# Patient Record
Sex: Male | Born: 1960 | Race: White | Hispanic: No | Marital: Single | Smoking: Former smoker
Health system: Southern US, Community
[De-identification: ages and names within clinical notes are randomized; demographics above are authoritative.]

## PROBLEM LIST (undated history)

## (undated) DIAGNOSIS — M1990 Unspecified osteoarthritis, unspecified site: Secondary | ICD-10-CM

## (undated) DIAGNOSIS — F419 Anxiety disorder, unspecified: Secondary | ICD-10-CM

## (undated) HISTORY — PX: VARICOSE VEIN SURGERY: SHX832

---

## 2007-04-30 ENCOUNTER — Emergency Department (HOSPITAL_COMMUNITY): Admission: EM | Admit: 2007-04-30 | Discharge: 2007-04-30 | Payer: Self-pay | Admitting: Nurse Practitioner

## 2007-08-02 ENCOUNTER — Ambulatory Visit: Payer: Self-pay | Admitting: Internal Medicine

## 2007-08-02 LAB — CONVERTED CEMR LAB
AST: 25 units/L (ref 0–37)
Albumin: 3.9 g/dL (ref 3.5–5.2)
Alkaline Phosphatase: 111 units/L (ref 39–117)
BUN: 16 mg/dL (ref 6–23)
Basophils Absolute: 0 10*3/uL (ref 0.0–0.1)
Calcium: 9.8 mg/dL (ref 8.4–10.5)
Chloride: 113 meq/L — ABNORMAL HIGH (ref 96–112)
Cholesterol: 205 mg/dL (ref 0–200)
Creatinine, Ser: 1 mg/dL (ref 0.4–1.5)
HCT: 42.5 % (ref 39.0–52.0)
MCHC: 34.4 g/dL (ref 30.0–36.0)
Monocytes Relative: 9.7 % (ref 3.0–11.0)
Platelets: 188 10*3/uL (ref 150–400)
RBC: 4.77 M/uL (ref 4.22–5.81)
RDW: 13.2 % (ref 11.5–14.6)
Total Bilirubin: 1.1 mg/dL (ref 0.3–1.2)
Total CHOL/HDL Ratio: 6.8
Triglycerides: 92 mg/dL (ref 0–149)

## 2007-11-01 ENCOUNTER — Telehealth (INDEPENDENT_AMBULATORY_CARE_PROVIDER_SITE_OTHER): Payer: Self-pay | Admitting: *Deleted

## 2007-11-01 DIAGNOSIS — F411 Generalized anxiety disorder: Secondary | ICD-10-CM | POA: Insufficient documentation

## 2008-04-30 ENCOUNTER — Ambulatory Visit: Payer: Self-pay | Admitting: Internal Medicine

## 2008-04-30 DIAGNOSIS — K921 Melena: Secondary | ICD-10-CM

## 2008-04-30 DIAGNOSIS — E785 Hyperlipidemia, unspecified: Secondary | ICD-10-CM | POA: Insufficient documentation

## 2008-04-30 DIAGNOSIS — K219 Gastro-esophageal reflux disease without esophagitis: Secondary | ICD-10-CM

## 2008-04-30 DIAGNOSIS — R5381 Other malaise: Secondary | ICD-10-CM

## 2008-04-30 DIAGNOSIS — R5383 Other fatigue: Secondary | ICD-10-CM

## 2008-04-30 DIAGNOSIS — F329 Major depressive disorder, single episode, unspecified: Secondary | ICD-10-CM

## 2008-05-01 ENCOUNTER — Telehealth: Payer: Self-pay | Admitting: Internal Medicine

## 2008-05-01 ENCOUNTER — Encounter: Payer: Self-pay | Admitting: Internal Medicine

## 2008-07-17 LAB — CONVERTED CEMR LAB
ALT: 33 units/L (ref 0–53)
Albumin: 4.1 g/dL (ref 3.5–5.2)
BUN: 14 mg/dL (ref 6–23)
Basophils Absolute: 0 10*3/uL (ref 0.0–0.1)
Basophils Relative: 0.4 % (ref 0.0–1.0)
CO2: 32 meq/L (ref 19–32)
Calcium: 9.6 mg/dL (ref 8.4–10.5)
Creatinine, Ser: 0.9 mg/dL (ref 0.4–1.5)
Eosinophils Absolute: 0.2 10*3/uL (ref 0.0–0.7)
Eosinophils Relative: 3.2 % (ref 0.0–5.0)
Hemoglobin: 14.8 g/dL (ref 13.0–17.0)
Lymphocytes Relative: 25 % (ref 12.0–46.0)
MCHC: 35.2 g/dL (ref 30.0–36.0)
MCV: 91 fL (ref 78.0–100.0)
Neutro Abs: 4.4 10*3/uL (ref 1.4–7.7)
RBC: 4.63 M/uL (ref 4.22–5.81)
TSH: 2.83 microintl units/mL (ref 0.35–5.50)
Total Bilirubin: 1 mg/dL (ref 0.3–1.2)
WBC: 6.8 10*3/uL (ref 4.5–10.5)

## 2010-07-20 ENCOUNTER — Emergency Department (HOSPITAL_COMMUNITY): Admission: EM | Admit: 2010-07-20 | Discharge: 2010-07-20 | Payer: Self-pay | Admitting: Emergency Medicine

## 2011-10-31 ENCOUNTER — Ambulatory Visit (INDEPENDENT_AMBULATORY_CARE_PROVIDER_SITE_OTHER): Payer: Medicare Other | Admitting: Family Medicine

## 2011-10-31 DIAGNOSIS — F988 Other specified behavioral and emotional disorders with onset usually occurring in childhood and adolescence: Secondary | ICD-10-CM

## 2011-10-31 DIAGNOSIS — E781 Pure hyperglyceridemia: Secondary | ICD-10-CM

## 2011-10-31 DIAGNOSIS — F411 Generalized anxiety disorder: Secondary | ICD-10-CM

## 2011-11-30 ENCOUNTER — Ambulatory Visit (INDEPENDENT_AMBULATORY_CARE_PROVIDER_SITE_OTHER): Payer: Medicare Other | Admitting: Family Medicine

## 2011-11-30 DIAGNOSIS — R7989 Other specified abnormal findings of blood chemistry: Secondary | ICD-10-CM

## 2011-11-30 DIAGNOSIS — F988 Other specified behavioral and emotional disorders with onset usually occurring in childhood and adolescence: Secondary | ICD-10-CM

## 2012-02-09 ENCOUNTER — Telehealth: Payer: Self-pay

## 2012-02-09 NOTE — Telephone Encounter (Signed)
PT WOULD LIKE TO SPEAK WITH SOMEONE REGARDING HIS SYMPTONS. STATES HE HAS HAD LYMPH DISEASE BEFORE AND MAY BE HAVING IT AGAIN, I TRIED ADVISING PT TO COME IN, BUT HE WOULD PREFER A CALL FIRST. ALSO HAVING PAIN BY HIS LIVER AND DIDN'T KNOW IF HE WAS DIABETIC PLEASE CALL [phone]

## 2012-02-10 NOTE — Telephone Encounter (Signed)
LMOM to CB. 

## 2012-02-12 NOTE — Telephone Encounter (Signed)
LMOM he will have to RTC to evaluate these symtoms

## 2012-02-29 ENCOUNTER — Ambulatory Visit (INDEPENDENT_AMBULATORY_CARE_PROVIDER_SITE_OTHER): Payer: Medicare Other | Admitting: Family Medicine

## 2012-02-29 VITALS — BP 151/101 | HR 73 | Temp 98.3°F | Resp 16 | Ht 71.25 in | Wt 215.8 lb

## 2012-02-29 DIAGNOSIS — J029 Acute pharyngitis, unspecified: Secondary | ICD-10-CM

## 2012-02-29 LAB — POCT CBC
Granulocyte percent: 58.7 %G (ref 37–80)
HCT, POC: 43.1 % — AB (ref 43.5–53.7)
Hemoglobin: 14.4 g/dL (ref 14.1–18.1)
MCH, POC: 29.2 pg (ref 27–31.2)
MCV: 87.5 fL (ref 80–97)
MID (cbc): 0.5 (ref 0–0.9)
RBC: 4.93 M/uL (ref 4.69–6.13)
WBC: 7.9 10*3/uL (ref 4.6–10.2)

## 2012-02-29 MED ORDER — FLUTICASONE PROPIONATE 50 MCG/ACT NA SUSP: 2.0000 | Freq: Every day | NASAL | Status: DC

## 2012-02-29 NOTE — Progress Notes (Signed)
Patient Name: [name] Date of Birth: 1961-03-04 Medical Record Number: [id number] Gender: male Date of Encounter: 02/29/2012  History of Present Illness:  [name] is a 51 y.o. very pleasant male patient who presents with the following:  "I don't know what's wrong with me."  Complaint of "splitting headaches" for several months (about 6 months), ST that was "terrible" yesterday but better today.  Sometimes feels "like my head is in a barrel" and has sinus pressure and congestion.  He had Lyme disease about a year ago and feels that maybe his symptoms are coming back except he has no rash.  He was treated for Lyme disease with doxycycline last year.    History of anxiety and panic.  Says he wonders if he is being poisoned by one of his neighbors.  Yesterday felt very tired and wanted to come in but felt too bad.  He feels a little better today.    He has continued to eat normally and does not feel that he is confused.  However he does state that sometimes her suffers from very bad panic attacks and thinks that these may fuel his headaches. He has also been overusing caffeine.  He states that there is not friend or family member he can call for support during these episodes. "I either have to come here (clinic) or call 911."    Patient Active Problem List  Diagnoses  . HYPERLIPIDEMIA  . Anxiety State, Unspecified  . DEPRESSION  . GERD  . HEMATOCHEZIA  . FATIGUE   No past medical history on file. No past surgical history on file. History  Substance Use Topics  . Smoking status: Current Some Day Smoker    Types: Cigars  . Smokeless tobacco: Not on file  . Alcohol Use: Not on file   No family history on file. No Known Allergies  Medication list has been reviewed and updated.  Review of Systems: As per HPI- otherwise negative. Mild cough, no fever "I have always been a very hyper person.  I just can't calm down."  Physical Examination: Filed Vitals:   02/29/12 1857    BP: 151/101  Pulse: 73  Temp: 98.3 F (36.8 C)  TempSrc: Oral  Resp: 16  Height: 5' 11.25" (1.81 m)  Weight: 215 lb 12.8 oz (97.886 kg)   Recheck BP 132/95 Body mass index is 29.89 kg/(m^2).  GEN: WDWN, NAD, Non-toxic, A & O x 3 HEENT: Atraumatic, Normocephalic. Neck supple- no meningismus. No masses, No LAD.  Tm and oropharynx wnl.  Nasal cavity congested Ears and Nose: No external deformity. CV: RRR, No M/G/R. No JVD. No thrill. No extra heart sounds. PULM: CTA B, no wheezes, crackles, rhonchi. No retractions. No resp. distress. No accessory muscle use. ABD: S, NT, ND, +BS. No rebound. No HSM. EXTR: No c/c/e NEURO Normal gait. Normal strength, ROM, sensation and DTR all extremities PSYCH: Normally interactive. Conversant. Somewhat anxious.  Has many concerns and symptoms today  Results for orders placed in visit on 02/29/12  POCT CBC      Component Value Range   WBC 7.9  4.6 - 10.2 (K/uL)   Lymph, poc 2.7  0.6 - 3.4    POC LYMPH PERCENT 34.6  10 - 50 (%L)   MID (cbc) 0.5  0 - 0.9    POC MID % 6.7  0 - 12 (%M)   POC Granulocyte 4.6  2 - 6.9    Granulocyte percent 58.7  37 - 80 (%G)  RBC 4.93  4.69 - 6.13 (M/uL)   Hemoglobin 14.4  14.1 - 18.1 (g/dL)   HCT, POC 16.1 (*) 09.6 - 53.7 (%)   MCV 87.5  80 - 97 (fL)   MCH, POC 29.2  27 - 31.2 (pg)   MCHC 33.4  31.8 - 35.4 (g/dL)   RDW, POC 04.5     Platelet Count, POC 255  142 - 424 (K/uL)   MPV 10.1  0 - 99.8 (fL)  POCT RAPID STREP A (OFFICE)      Component Value Range   Rapid Strep A Screen Negative  Negative    Assessment and Plan: 1. Sore throat  POCT CBC, POCT rapid strep A  2. Allergic rhinitis  fluticasone (FLONASE) 50 MCG/ACT nasal spray   Suspect that ST and sinus congestion are likely allergy related.  Try flonase and OTC claritin.  If not feeling better in a few days please let us know- sooner if worse.  I explained to [name] that I cannot totally rule out some of his concerns including poisoning, "cancer" and  "meningitis" at clinic today.  I do not have reason to believe that he is suffering from these conditions- especially as his HA have gone on for 6 months and he has no meningeal signs.  However, he certainly may go to the ED for further evaluation if he desires.  He elected to try treatment for allergies first but will seek further care if he is not better- Sooner if worse.

## 2012-03-21 ENCOUNTER — Telehealth: Payer: Self-pay

## 2012-03-21 MED ORDER — OMEPRAZOLE 40 MG PO CPDR: 40.0000 mg | DELAYED_RELEASE_CAPSULE | Freq: Every day | ORAL | Status: DC

## 2012-03-21 NOTE — Telephone Encounter (Signed)
Ok with plan

## 2012-03-21 NOTE — Telephone Encounter (Signed)
Patient called again re: Omeprazole, wanting to pick it up tonight.  Ok'd 30 day rx, and patient advised to RTC for recheck before next refill.  Had CPE in November. Ok with plan?

## 2012-03-21 NOTE — Telephone Encounter (Signed)
Dr Audria Nine   Pt is requesting a new script on oxetrazole 40 mg. For his heartburn.   He has considered you as his PCP and would like to continue medication previously prescribed by another MD.  [facility] - [address]

## 2012-04-23 ENCOUNTER — Other Ambulatory Visit: Payer: Self-pay | Admitting: Physician Assistant

## 2012-04-23 NOTE — Telephone Encounter (Signed)
PT STATES HIS PHARMACY WAS TRYING TO FAX OVER A REFILL ON SOME MEDICINE FOR HIS STOMACH AND HE WANT TO MAKE SURE DR MCPHERSON KNOW HOW MUCH HE NEEDED IT. PLEASE CALL PT AT [phone]

## 2012-05-28 ENCOUNTER — Telehealth: Payer: Self-pay

## 2012-05-28 MED ORDER — OMEPRAZOLE 40 MG PO CPDR: 40.0000 mg | DELAYED_RELEASE_CAPSULE | Freq: Every day | ORAL | Status: DC

## 2012-05-28 NOTE — Telephone Encounter (Signed)
Pt states pharmacy should be calling about a rx refill on his prilosec, he states it is imperative that he get this rx refill.

## 2012-05-28 NOTE — Telephone Encounter (Signed)
Pt notified that rx was sent in 

## 2012-06-13 ENCOUNTER — Telehealth: Payer: Self-pay

## 2012-06-13 MED ORDER — OMEPRAZOLE 40 MG PO CPDR: 40.0000 mg | DELAYED_RELEASE_CAPSULE | Freq: Every day | ORAL | Status: DC

## 2012-06-13 NOTE — Telephone Encounter (Signed)
Pt has an appt on Tues. Can we RF it one more time for him. Has only one pill left for tomorrow and he gets terrible heartburn without it.

## 2012-06-13 NOTE — Telephone Encounter (Signed)
Rx sent to pharmacy   

## 2012-06-13 NOTE — Telephone Encounter (Signed)
Pt would like to get a refill sent to his pharmacy for Prilosec.

## 2012-06-13 NOTE — Telephone Encounter (Signed)
Pt.notified

## 2012-06-18 ENCOUNTER — Encounter: Payer: Self-pay | Admitting: Family Medicine

## 2012-06-18 ENCOUNTER — Ambulatory Visit (INDEPENDENT_AMBULATORY_CARE_PROVIDER_SITE_OTHER): Payer: Medicare Other | Admitting: Family Medicine

## 2012-06-18 VITALS — BP 139/92 | HR 85 | Temp 97.1°F | Resp 18 | Ht 71.5 in | Wt 226.0 lb

## 2012-06-18 DIAGNOSIS — G8929 Other chronic pain: Secondary | ICD-10-CM

## 2012-06-18 DIAGNOSIS — R5381 Other malaise: Secondary | ICD-10-CM

## 2012-06-18 DIAGNOSIS — R51 Headache: Secondary | ICD-10-CM

## 2012-06-18 DIAGNOSIS — Z8619 Personal history of other infectious and parasitic diseases: Secondary | ICD-10-CM

## 2012-06-18 DIAGNOSIS — M949 Disorder of cartilage, unspecified: Secondary | ICD-10-CM

## 2012-06-18 DIAGNOSIS — M898X9 Other specified disorders of bone, unspecified site: Secondary | ICD-10-CM

## 2012-06-18 DIAGNOSIS — R5383 Other fatigue: Secondary | ICD-10-CM

## 2012-06-18 DIAGNOSIS — M2550 Pain in unspecified joint: Secondary | ICD-10-CM

## 2012-06-18 LAB — COMPREHENSIVE METABOLIC PANEL
ALT: 52 U/L (ref 0–53)
Albumin: 4.2 g/dL (ref 3.5–5.2)
CO2: 25 mEq/L (ref 19–32)
Chloride: 104 mEq/L (ref 96–112)
Glucose, Bld: 204 mg/dL — ABNORMAL HIGH (ref 70–99)
Potassium: 4.5 mEq/L (ref 3.5–5.3)
Sodium: 138 mEq/L (ref 135–145)
Total Protein: 7.1 g/dL (ref 6.0–8.3)

## 2012-06-18 MED ORDER — ALPRAZOLAM 0.5 MG PO TABS: ORAL_TABLET | ORAL | Status: DC

## 2012-06-18 MED ORDER — AMPHETAMINE-DEXTROAMPHETAMINE 15 MG PO TABS: 15.0000 mg | ORAL_TABLET | Freq: Every day | ORAL | Status: DC

## 2012-06-18 MED ORDER — AMPHETAMINE-DEXTROAMPHET ER 15 MG PO CP24: 15.0000 mg | ORAL_CAPSULE | ORAL | Status: DC

## 2012-06-18 MED ORDER — OMEPRAZOLE 40 MG PO CPDR: 40.0000 mg | DELAYED_RELEASE_CAPSULE | Freq: Every day | ORAL | Status: DC

## 2012-06-18 NOTE — Patient Instructions (Signed)
Dupuytren's Contracture Dupuytren's contracture affects the fingers and the palm of the hand. This condition usually develops slowly. It may take many years to develop. The pinky finger and the ring finger are most often affected. These fingers start to curve inward, like a claw. At some point, the fingers cannot go straight anymore. This can make it hard to do things like:  Put on gloves.   Shake hands.   Grab something off a shelf.  The condition usually does not cause pain and is not dangerous. The condition gets its name from the doctor who came up with an operation to fix the problem. His name was Baron Guillaume Dupuytren. Contracture means pulling inward. CAUSES  Dupuytren's contracture does not start with the fingers. It starts in the palm of the hand, under the skin. The tissue under the skin is called fascia. The fascia covers the cords (tendons) that control how the fingers move. In Dupuytren's contracture the fascia tissue becomes thick and then pulls on the cords. That causes the fingers to curl. The condition can affect both hands and any fingers, but it usually strikes one hand worse than the other. The fingers farthest from the thumb are most often the ones that curl. The cause is not clear. Some experts believe it results from an autoimmune reaction. That means the body's immune system (which fights off disease) attacks itself by mistake. What experts do know is that certain conditions and behaviors (called risk factors) make the chance of having this condition more likely. They include:  Age. Most people who have the condition are older than 50.   Sex. It affects men more often than women.   Family history. The condition tends to run in families from countries in [place].   Certain behaviors. People who smoke and drink alcohol are more apt to develop the problem.   Some other medical conditions. Having diabetes makes Dupuytren's contracture more likely.  So does having a condition that involves a seizure (when the brain's function is interrupted).  SYMPTOMS  Signs of this condition take time to develop. Sometimes this takes weeks or months. More often, it takes several years.   Early symptoms:   Skin on the palm of the hand becomes thick. This is usually the first sign.   The skin may look dimpled or puckered.   Lumps (nodules) show up on the palm. There may be one or more lumps. They are not painful.   Later symptoms:   Thick cords of tissue form in the palm of the hand.   The pinky and ring fingers start to curl up into the palm.   The fingers cannot be straightened into their normal position.  DIAGNOSIS  A physical examination is the main way that a healthcare provider can tell if you have Dupuytren's contracture. Other tests usually are not needed. The caregiver will probably:  Look at your hands. Feel your hands. This is to check for thickening and nodules.   Measure finger motion. This tells how much your fingers have contracted (pulled in).   Do a tabletop test. You will be asked to try to put your hand flat on a table, palm down.  TREATMENT  There is no cure for Dupuytren's contracture. But there are ways to treat the symptoms. Options include:  Watching and waiting. The condition develops slowly. Often it does not create problems for a long time. Sometimes the skin gets thick and nodules form, but the fingers never curl. So,   in some cases it is best to just watch the condition carefully and wait to see what happens.   Shots (injections). Different substances may be injected, including:   Steroids. These drugs block swelling. These shots should make the condition less uncomfortable. Steroids may also slow down the condition. Shots are given into the nodules. The effect only lasts awhile. More shots may have to be given.   Enzymes. These are proteins. They weaken the thick tissue. After an injection, the caregiver usually  stretches the fingers.   Needling. A needle is pushed through the skin and into the thick tissue. This is done in several spots. The goal is to break up the thickened tissue. Or to weaken it.   Surgery. This may be suggested if you cannot grasp objects. Or, if you can no longer put your hand in your pocket.   A cut (incision) is made in the palm of the hand. The thick tissue is removed.   Sometimes the thick tissue is attached to the skin. Then, the skin must be removed, too. It is replaced with a piece of skin from another place on your body. That is called a skin graft.   Occupational or hand therapy is almost always needed after surgery. This involves special exercises to get back the use of your hand and fingers. After a skin graft, several months of therapy may be needed.   Sometimes the condition comes back, even after surgery.   Other methods. You can do some things on your own. They include:   Stretching the fingers backwards. Do this often.   Warming the hand and massaging it. Again, do this often.   Using tools with padded grips. This should make things easier.   Wearing heavy gloves while working. This protects the hands.  PROGNOSIS  Dupuytren's contracture usually develops slowly. There is no cure. But, the symptoms can be treated. Sometimes they come back after treatment, but not always. It is important to remember that this is a functional problem and not a life-threatening condition. Document Released: 08/27/2009 Document Revised: 10/19/2011 Document Reviewed: 08/27/2009 [facility] Patient Information 2012 [place], [place].   For better health: get a Men's Multivitamin and a Flax Seed Oil supplement and take both of these daily. Also commit to walking 3 times a week for better mental health and better sleep as well as improving your physical health.

## 2012-06-18 NOTE — Progress Notes (Signed)
  Subjective:    Patient ID: [name], male    DOB: 04-17-1961, 51 y.o.   MRN: [id number]  HPI Pt continues to have sinus congestion and frontal pressure "like something is in his head".   States he has chronic problems with vision- wears contacts when not at home even though he  cannot see well. Thoughts are rushed and mind is in a fog-current medications help.  Pt feels like he feels this way every since he had Lyme disease; some days he feels fine.   "I feel paranoid about what's going on. Not sure what to do about this"."Yesterday was a good  day but 2 days ago was terrible". "I wake up and some days I can feel my liver- it's throbbing".  Denies nausea, vomiting, fever. Feels better after showering.  There is a hole in the roof above his room that needs repair; current residence- 5 years.  Trying to find another place to live.       Review of Systems As per HPI     Objective:   Physical Exam  Nursing note and vitals reviewed. Constitutional: He is oriented to person, place, and time. He appears well-developed and well-nourished. No distress.  HENT:  Head: Normocephalic and atraumatic.  Right Ear: Hearing, tympanic membrane, external ear and ear canal normal.  Left Ear: Hearing, tympanic membrane, external ear and ear canal normal.  Nose: Nose normal. No nasal deformity or septal deviation.  Mouth/Throat: Uvula is midline and mucous membranes are normal. No oral lesions. No uvula swelling. Posterior oropharyngeal erythema present. No oropharyngeal exudate.  Eyes: EOM and lids are normal. Pupils are equal, round, and reactive to light. Right conjunctiva is injected. Left conjunctiva is injected.  Neck: No thyromegaly present.  Cardiovascular: Normal rate, regular rhythm and normal heart sounds.  Exam reveals no gallop.   No murmur heard. Pulmonary/Chest: Effort normal and breath sounds normal. No respiratory distress. He has no wheezes.  Abdominal: Soft. Bowel sounds are  normal. He exhibits no distension and no mass. There is no hepatosplenomegaly. There is tenderness in the right upper quadrant and epigastric area. There is no rebound, no guarding and no CVA tenderness.  Musculoskeletal: Normal range of motion.  Lymphadenopathy:    He has no cervical adenopathy.  Neurological: He is alert and oriented to person, place, and time. No cranial nerve deficit.  Skin: Skin is warm and dry. No rash noted.  Psychiatric: His mood appears anxious. His affect is not labile. His speech is tangential. His speech is not rapid and/or pressured. He is is hyperactive. Thought content is paranoid. Cognition and memory are normal. He does not exhibit a depressed mood.          Assessment & Plan:   1. Chronic joint pain  Vitamin D, 25-hydroxy, Sedimentation Rate  2. History of Lyme disease  Comprehensive metabolic panel, Sedimentation Rate  3. Fatigue - pt not sleeping well Vitamin D, 25-hydroxy, Comprehensive metabolic panel  4. Headache  Sedimentation Rate Reassurance  5. Bone pain  Vitamin D, 25-hydroxy OTC supplements recommended

## 2012-06-19 LAB — SEDIMENTATION RATE: Sed Rate: 1 mm/hr (ref 0–16)

## 2012-06-19 LAB — VITAMIN D 25 HYDROXY (VIT D DEFICIENCY, FRACTURES): Vit D, 25-Hydroxy: 15 ng/mL — ABNORMAL LOW (ref 30–89)

## 2012-06-21 ENCOUNTER — Other Ambulatory Visit: Payer: Self-pay | Admitting: Family Medicine

## 2012-06-21 MED ORDER — ERGOCALCIFEROL 1.25 MG (50000 UT) PO CAPS: 50000.0000 [IU] | ORAL_CAPSULE | ORAL | Status: DC

## 2012-06-21 NOTE — Progress Notes (Signed)
Quick Note:  Please call pt and advise that the following labs are abnormal...  Vitamin D level is low; I am prescribing Vitamin D 50,000 IU Take 1 capsule once a week. You will get 4 capsules per month; be sure to get it refilled each month.  Your blood sugar is high; try to eat healthier and be more active. We will recheck your blood sugar when you return.   We will go over these results when you return.  Copy to pt.  ______

## 2012-07-16 ENCOUNTER — Ambulatory Visit: Payer: Medicare Other | Admitting: Family Medicine

## 2012-07-23 ENCOUNTER — Ambulatory Visit (INDEPENDENT_AMBULATORY_CARE_PROVIDER_SITE_OTHER): Payer: Medicare Other | Admitting: Family Medicine

## 2012-07-23 VITALS — BP 162/92 | HR 83 | Temp 97.8°F | Resp 18 | Ht 72.0 in | Wt 229.0 lb

## 2012-07-23 DIAGNOSIS — R7309 Other abnormal glucose: Secondary | ICD-10-CM

## 2012-07-23 DIAGNOSIS — M79609 Pain in unspecified limb: Secondary | ICD-10-CM

## 2012-07-23 DIAGNOSIS — E559 Vitamin D deficiency, unspecified: Secondary | ICD-10-CM

## 2012-07-23 DIAGNOSIS — M79671 Pain in right foot: Secondary | ICD-10-CM

## 2012-07-23 DIAGNOSIS — R739 Hyperglycemia, unspecified: Secondary | ICD-10-CM

## 2012-07-23 DIAGNOSIS — M720 Palmar fascial fibromatosis [Dupuytren]: Secondary | ICD-10-CM

## 2012-07-23 DIAGNOSIS — E119 Type 2 diabetes mellitus without complications: Secondary | ICD-10-CM

## 2012-07-23 DIAGNOSIS — M79672 Pain in left foot: Secondary | ICD-10-CM

## 2012-07-23 LAB — POCT GLYCOSYLATED HEMOGLOBIN (HGB A1C): Hemoglobin A1C: 6.9

## 2012-07-23 MED ORDER — OMEPRAZOLE 40 MG PO CPDR: 40.0000 mg | DELAYED_RELEASE_CAPSULE | Freq: Every day | ORAL | Status: DC

## 2012-07-23 MED ORDER — ALPRAZOLAM 0.5 MG PO TABS: ORAL_TABLET | ORAL | Status: DC

## 2012-07-23 MED ORDER — AMPHETAMINE-DEXTROAMPHETAMINE 15 MG PO TABS: 15.0000 mg | ORAL_TABLET | Freq: Every day | ORAL | Status: DC

## 2012-07-23 MED ORDER — METFORMIN HCL ER 500 MG PO TB24: 500.0000 mg | ORAL_TABLET | Freq: Every day | ORAL | Status: DC

## 2012-07-23 MED ORDER — FLUTICASONE PROPIONATE 50 MCG/ACT NA SUSP: 2.0000 | Freq: Every day | NASAL | Status: DC

## 2012-07-23 NOTE — Progress Notes (Signed)
S: Pt c/o foot numbness or "like there is a cushion" on bottom of feet; was having redness but no swelling. Has changed shoes and feet feel a little better today. Has some mental fogginess first thing in the morning but it gets better after showering.  Pt has Dupuytren's contracture of the right hand and he wonders if this condition could be affecting his feet. He recalls that his mother had a similar condition that affected her left hand. He has felt like he is walking like his mother ("like an elderly   person") because his feet hurt so bad. He has replaced his shoes with flat slides ([employer] brand) which seems to help.   Re: Depression- wants to try to be more active because he feels better when he exercises regularly; some days, he feels pessimistic about his life. Not following good nutrition and thinks this is part of why he doesn't feel good and has mental  fogginess and fatigue. He craves sweets and drinks several sodas daily.  O:  Filed Vitals:   07/23/12 1445  BP: 162/92  Pulse: 83  Temp: 97.8 F (36.6 C)  Resp: 18   GEN: In NAD; WN.WD. Slightly disheveled appearance. HENT: [place]/AT; EOMI, conj/scl clear COR: RRR; no edema. LUNGS: CTA; normal resp rate and effort. MS: Feet- dorsum of both feet with prominent fat pads over balls of feet with high arch; toes drawn up into hyperflexed position. NEURO: A&O x 3; CNs intact; Sensation to light touch of lower ext grossly intact.  A1C= 6.9%   A/P: 1. Foot pain, bilateral  Ambulatory referral to Podiatry  2. Hyperglycemia  POCT glycosylated hemoglobin (Hb A1C)  3. Type II or unspecified type diabetes mellitus without mention of complication, not stated as uncontrolled  New onset DM with above normal A1C RX: Metformin 500 mg 24hr tablet  1 tab daily with breakfast Improve nutrition; avoid concentrated sweets D/C sodas; stay active.  4. Dupuytren's contracture of right hand    5. Vitamin d deficiency  Continue supplement once a week.

## 2012-07-23 NOTE — Patient Instructions (Signed)
Diabetes, Frequently Asked Questions  WHAT IS DIABETES?  Most of the food we eat is turned into glucose (sugar). Our bodies use it for energy. The pancreas makes a hormone called insulin. It helps glucose get into the cells of our bodies. When you have diabetes, your body either does not make enough insulin or cannot use its own insulin as well as it should. This causes sugars to build up in your blood.  WHAT ARE THE SYMPTOMS OF DIABETES?   Frequent urination.    Excessive thirst.    Unexplained weight loss.    Extreme hunger.    Blurred vision.    Tingling or numbness in hands or feet.    Feeling very tired much of the time.    Dry, itchy skin.    Sores that are slow to heal.    Yeast infections.   WHAT ARE THE TYPES OF DIABETES?  Type 1 Diabetes    About 10% of affected people have this type.    Usually occurs before the age of 30.    Usually occurs in thin to normal weight people.   Type 2 Diabetes   About 90% of affected people have this type.    Usually occurs after the age of 40.    Usually occurs in overweight people.    More likely to have:    A family history of diabetes.    A history of diabetes during pregnancy (gestational diabetes).    High blood pressure.    High cholesterol and triglycerides.   Gestational Diabetes   Occurs in about 4% of pregnancies.    Usually goes away after the baby is born.    More likely to occur in women with:    Family history of diabetes.    Previous gestational diabetes.    Obese.    Over 25 years old.   WHAT IS PRE-DIABETES?  Pre-diabetes means your blood glucose is higher than normal, but lower than the diabetes range. It also means you are at risk of getting type 2 diabetes and heart disease. If you are told you have pre-diabetes, have your blood glucose checked again in 1 to 2 years.  WHAT IS THE TREATMENT FOR DIABETES?   Treatment is aimed at keeping blood glucose near normal levels at all times. Learning how to manage this yourself is important in treating diabetes. Depending on the type of diabetes you have, your treatment will include one or more of the following:   Monitoring your blood glucose.    Meal planning.    Exercise.    Oral medicine (pills) or insulin.   CAN DIABETES BE PREVENTED?  With type 1 diabetes, prevention is more difficult, because the triggers that cause it are not yet known.  With type 2 diabetes, prevention is more likely, with lifestyle changes:   Maintain a healthy weight.    Eat healthy.    Exercise.   IS THERE A CURE FOR DIABETES?  No, there is no cure for diabetes. There is a lot of research going on that is looking for a cure, and progress is being made. Diabetes can be treated and controlled. People with diabetes can manage their diabetes and lead normal, active lives.  SHOULD I BE TESTED FOR DIABETES?  If you are at least 51 years old, you should be tested for diabetes. You should be tested again every 3 years. If you are 45 or older and overweight, you may want to get tested more often. If   you are younger than 45, overweight, and have one or more of the following risk factors, you should be tested:   Family history of diabetes.    Inactive lifestyle.    High blood pressure.   WHAT ARE SOME OTHER SOURCES FOR INFORMATION ON DIABETES?  The following organizations may help in your search for more information on diabetes:  National Diabetes Education Program (NDEP)  Internet: http://www.ndep.nih.gov/resources  American Diabetes Association  Internet: http://www.diabetes.org    Juvenile Diabetes Foundation International  Internet: http://www.jdf.org  Document Released: 11/02/2003 Document Revised: 10/19/2011 Document Reviewed: 08/27/2009  ExitCare Patient Information 2012 [employer].    Diabetes Meal Planning Guide   The diabetes meal planning guide is a tool to help you plan your meals and snacks. It is important for people with diabetes to manage their blood glucose (sugar) levels. Choosing the right foods and the right amounts throughout your day will help control your blood glucose. Eating right can even help you improve your blood pressure and reach or maintain a healthy weight.  CARBOHYDRATE COUNTING MADE EASY  When you eat carbohydrates, they turn to sugar. This raises your blood glucose level. Counting carbohydrates can help you control this level so you feel better. When you plan your meals by counting carbohydrates, you can have more flexibility in what you eat and balance your medicine with your food intake.  Carbohydrate counting simply means adding up the total amount of carbohydrate grams in your meals and snacks. Try to eat about the same amount at each meal. Foods with carbohydrates are listed below. Each portion below is 1 carbohydrate serving or 15 grams of carbohydrates. Ask your dietician how many grams of carbohydrates you should eat at each meal or snack.  Grains and Starches   1 slice bread.     English muffin or hotdog/hamburger bun.     cup cold cereal (unsweetened).    ? cup cooked pasta or rice.     cup starchy vegetables (corn, potatoes, peas, beans, winter squash).    1 tortilla (6 inches).     bagel.    1 waffle or pancake (size of a CD).     cup cooked cereal.    4 to 6 small crackers.   *Whole grain is recommended.  Fruit   1 cup fresh unsweetened berries, melon, papaya, pineapple.    1 small fresh fruit.     banana or mango.     cup fruit juice (4 oz unsweetened).     cup canned fruit in natural juice or water.    2 tbs dried fruit.    12 to 15 grapes or cherries.   Milk and Yogurt   1 cup fat-free or 1% milk.    1 cup soy milk.    6 oz light yogurt with sugar-free sweetener.    6 oz low-fat soy yogurt.    6 oz plain yogurt.   Vegetables    1 cup raw or  cup cooked is counted as 0 carbohydrates or a "free" food.    If you eat 3 or more servings at 1 meal, count them as 1 carbohydrate serving.   Other Carbohydrates    oz chips or pretzels.     cup ice cream or frozen yogurt.     cup sherbet or sorbet.    2 inch square cake, no frosting.    1 tbs honey, sugar, jam, jelly, or syrup.    2 small cookies.    3 squares of   graham crackers.    3 cups popcorn.    6 crackers.    1 cup broth-based soup.    Count 1 cup casserole or other mixed foods as 2 carbohydrate servings.    Foods with less than 20 calories in a serving may be counted as 0 carbohydrates or a "free" food.   You may want to purchase a book or computer software that lists the carbohydrate gram counts of different foods. In addition, the nutrition facts panel on the labels of the foods you eat are a good source of this information. The label will tell you how big the serving size is and the total number of carbohydrate grams you will be eating per serving. Divide this number by 15 to obtain the number of carbohydrate servings in a portion. Remember, 1 carbohydrate serving equals 15 grams of carbohydrate.  SERVING SIZES  Measuring foods and serving sizes helps you make sure you are getting the right amount of food. The list below tells how big or small some common serving sizes are.   1 oz.........4 stacked dice.    3 oz.........Deck of cards.    1 tsp........Tip of little finger.    1 tbs........Thumb.    2 tbs........Golf ball.     cup.......Half of a fist.    1 cup........A fist.   SAMPLE DIABETES MEAL PLAN   Below is a sample meal plan that includes foods from the grain and starches, dairy, vegetable, fruit, and meat groups. A dietician can individualize a meal plan to fit your calorie needs and tell you the number of servings needed from each food group. However, controlling the total amount of carbohydrates in your meal or snack is more important than making sure you include all of the food groups at every meal. You may interchange carbohydrate containing foods (dairy, starches, and fruits).  The meal plan below is an example of a 2000 calorie diet using carbohydrate counting. This meal plan has 17 carbohydrate servings.  Breakfast   1 cup oatmeal (2 carb servings).     cup light yogurt (1 carb serving).    1 cup blueberries (1 carb serving).     cup almonds.   Snack   1 large apple (2 carb servings).    1 low-fat string cheese stick.   Lunch   Chicken breast salad.    1 cup spinach.     cup chopped tomatoes.    2 oz chicken breast, sliced.    2 tbs low-fat Italian dressing.    12 whole-wheat crackers (2 carb servings).    12 to 15 grapes (1 carb serving).    1 cup low-fat milk (1 carb serving).   Snack   1 cup carrots.     cup hummus (1 carb serving).   Dinner   3 oz broiled salmon.    1 cup brown rice (3 carb servings).   Snack   1  cups steamed broccoli (1 carb serving) drizzled with 1 tsp olive oil and lemon juice.    1 cup light pudding (2 carb servings).   DIABETES MEAL PLANNING WORKSHEET  Your dietician can use this worksheet to help you decide how many servings of foods and what types of foods are right for you.    BREAKFAST  Food Group and Servings / Carb Servings  Grain/Starches __________________________________  Dairy __________________________________________  Vegetable ______________________________________  Fruit ___________________________________________  Meat __________________________________________  Fat ____________________________________________  LUNCH   Food Group and Servings / Carb Servings

## 2012-07-24 ENCOUNTER — Encounter: Payer: Self-pay | Admitting: Family Medicine

## 2012-07-27 ENCOUNTER — Encounter (HOSPITAL_COMMUNITY): Payer: Self-pay | Admitting: Emergency Medicine

## 2012-07-27 ENCOUNTER — Emergency Department (HOSPITAL_COMMUNITY)
Admission: EM | Admit: 2012-07-27 | Discharge: 2012-07-27 | Disposition: A | Discharge status: Home / Self Care | Payer: Medicare Other | Attending: Emergency Medicine | Admitting: Emergency Medicine

## 2012-07-27 DIAGNOSIS — Y998 Other external cause status: Secondary | ICD-10-CM | POA: Insufficient documentation

## 2012-07-27 DIAGNOSIS — Y93I9 Activity, other involving external motion: Secondary | ICD-10-CM | POA: Insufficient documentation

## 2012-07-27 DIAGNOSIS — F17200 Nicotine dependence, unspecified, uncomplicated: Secondary | ICD-10-CM | POA: Insufficient documentation

## 2012-07-27 DIAGNOSIS — E119 Type 2 diabetes mellitus without complications: Secondary | ICD-10-CM | POA: Insufficient documentation

## 2012-07-27 DIAGNOSIS — S161XXA Strain of muscle, fascia and tendon at neck level, initial encounter: Secondary | ICD-10-CM

## 2012-07-27 DIAGNOSIS — S139XXA Sprain of joints and ligaments of unspecified parts of neck, initial encounter: Secondary | ICD-10-CM | POA: Insufficient documentation

## 2012-07-27 MED ORDER — METHOCARBAMOL 500 MG PO TABS
1000.0000 mg | ORAL_TABLET | Freq: Once | ORAL | Status: AC
  Administered 2012-07-27: 1000 mg via ORAL
  Filled 2012-07-27: qty 2

## 2012-07-27 MED ORDER — KETOROLAC TROMETHAMINE 60 MG/2ML IM SOLN
60.0000 mg | Freq: Once | INTRAMUSCULAR | Status: AC
  Administered 2012-07-27: 60 mg via INTRAMUSCULAR
  Filled 2012-07-27: qty 2

## 2012-07-27 MED ORDER — TRAMADOL HCL 50 MG PO TABS
50.0000 mg | ORAL_TABLET | Freq: Once | ORAL | Status: AC
  Administered 2012-07-27: 50 mg via ORAL
  Filled 2012-07-27: qty 1

## 2012-07-27 MED ORDER — METHOCARBAMOL 500 MG PO TABS: 500.0000 mg | ORAL_TABLET | Freq: Two times a day (BID) | ORAL | Status: AC

## 2012-07-27 MED ORDER — TRAMADOL HCL 50 MG PO TABS: 50.0000 mg | ORAL_TABLET | Freq: Four times a day (QID) | ORAL | Status: AC | PRN

## 2012-07-27 MED ORDER — IBUPROFEN 600 MG PO TABS: 600.0000 mg | ORAL_TABLET | Freq: Four times a day (QID) | ORAL | Status: AC | PRN
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
## 2012-07-27 NOTE — ED Notes (Signed)
Bedside report received from previous RN 
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
## 2012-08-20 ENCOUNTER — Encounter: Payer: Self-pay | Admitting: Family Medicine

## 2012-08-20 DIAGNOSIS — M216X9 Other acquired deformities of unspecified foot: Secondary | ICD-10-CM | POA: Insufficient documentation

## 2012-09-24 ENCOUNTER — Ambulatory Visit: Payer: Medicare Other | Admitting: Family Medicine

## 2013-01-20 ENCOUNTER — Telehealth: Payer: Self-pay

## 2013-01-20 NOTE — Telephone Encounter (Signed)
Okay to refer? 

## 2013-01-20 NOTE — Telephone Encounter (Signed)
Pt last seen by me ~ 6 months ago; I would prefer he schedule a visit to discuss disease management and why he thinks he needs an Endocrine specialist. His A1c= 6.9% in Sept 2013. This was at the time of diagnosis and medication prescribed Was Metformin oral tab once a day. We discussed some basic nutrition changes. He has anxiety disorder which is probably the reason he feels the need to see a specialist.

## 2013-01-20 NOTE — Telephone Encounter (Signed)
Pt is wanting to talk with someone about getting a referral to dr balan for his diabetes

## 2013-01-21 NOTE — Telephone Encounter (Signed)
Please call patient regarding this he is still wanting a referral to dr Gilman Schmidt office

## 2013-01-22 NOTE — Telephone Encounter (Signed)
Called, left message for him to call me back.

## 2013-01-23 NOTE — Telephone Encounter (Signed)
Contact pt to come in to see me tomorrow, maybe about 2:15 PM. We can discuss the plan (and maybe he would agree to some in-home education from [facility]).

## 2013-01-23 NOTE — Telephone Encounter (Signed)
Patient states he does not think Dr Audria Nine is not knowledgeable about diabetes, he feels like he is having spells and it took him a while to get a diagnosis. He states he is in a fog at times and is unclear if this relates to his diabetes. He feels like he is having these spells more often he states his blood sugars are still elevated on the metformin and he is afraid he is going to have a stroke, he states he does not want to die alone in the street. He sounds VERY anxious.

## 2013-01-24 NOTE — Telephone Encounter (Signed)
Called him, left message for him to call me back to see if he can come in today at 2:15. Amy

## 2013-01-27 NOTE — Telephone Encounter (Signed)
To you FYI, patient has not returned my call about coming in to the office.

## 2013-02-24 ENCOUNTER — Other Ambulatory Visit: Payer: Self-pay | Admitting: Family Medicine

## 2013-03-18 ENCOUNTER — Encounter: Payer: Self-pay | Admitting: Family Medicine

## 2013-03-24 ENCOUNTER — Other Ambulatory Visit: Payer: Self-pay | Admitting: Family Medicine

## 2013-04-03 ENCOUNTER — Encounter: Payer: Self-pay | Admitting: Family Medicine

## 2013-04-03 ENCOUNTER — Ambulatory Visit (INDEPENDENT_AMBULATORY_CARE_PROVIDER_SITE_OTHER): Payer: Medicare Other | Admitting: Family Medicine

## 2013-04-03 ENCOUNTER — Ambulatory Visit: Payer: Medicare Other

## 2013-04-03 ENCOUNTER — Other Ambulatory Visit: Payer: Self-pay | Admitting: Family Medicine

## 2013-04-03 VITALS — BP 146/86 | HR 105 | Temp 97.9°F | Resp 16 | Ht 71.0 in | Wt 227.8 lb

## 2013-04-03 DIAGNOSIS — L909 Atrophic disorder of skin, unspecified: Secondary | ICD-10-CM

## 2013-04-03 DIAGNOSIS — M720 Palmar fascial fibromatosis [Dupuytren]: Secondary | ICD-10-CM

## 2013-04-03 DIAGNOSIS — M546 Pain in thoracic spine: Secondary | ICD-10-CM

## 2013-04-03 DIAGNOSIS — L918 Other hypertrophic disorders of the skin: Secondary | ICD-10-CM

## 2013-04-03 DIAGNOSIS — E119 Type 2 diabetes mellitus without complications: Secondary | ICD-10-CM

## 2013-04-03 DIAGNOSIS — F411 Generalized anxiety disorder: Secondary | ICD-10-CM

## 2013-04-03 DIAGNOSIS — E559 Vitamin D deficiency, unspecified: Secondary | ICD-10-CM

## 2013-04-03 LAB — GLUCOSE, POCT (MANUAL RESULT ENTRY): POC Glucose: 245 mg/dl — AB (ref 70–99)

## 2013-04-03 LAB — POCT GLYCOSYLATED HEMOGLOBIN (HGB A1C): Hemoglobin A1C: 7.2

## 2013-04-03 MED ORDER — ALPRAZOLAM 0.5 MG PO TABS: ORAL_TABLET | ORAL | Status: DC

## 2013-04-03 MED ORDER — TRAMADOL-ACETAMINOPHEN 37.5-325 MG PO TABS: 1.0000 | ORAL_TABLET | Freq: Four times a day (QID) | ORAL | Status: DC | PRN

## 2013-04-03 MED ORDER — AMPHETAMINE-DEXTROAMPHETAMINE 15 MG PO TABS: 15.0000 mg | ORAL_TABLET | Freq: Every day | ORAL | Status: DC

## 2013-04-03 MED ORDER — OMEPRAZOLE 40 MG PO CPDR: 40.0000 mg | DELAYED_RELEASE_CAPSULE | Freq: Every day | ORAL | Status: DC

## 2013-04-03 NOTE — Progress Notes (Signed)
S:: This 52 y.o. Cauc male is here for Type II DM follow-up; pt was last seen in Sept 2013 and sch to return in Nov 2013. He has not been compliant w/ self-management- drinking a lot of colas, eating concentrated sweets, not exercising and not taking meds as prescribed. He states he is very concerned about consequences of uncontrolled disease, since he watched his mother succumb to Diabetes.  He admits eating because of boredom and lack of self-control. He reports episodes of dizziness and feeling like he is "in a barrel", especially after meals. He is not sure if this is sinus-related /congestion or due to high blood sugar. He does not check FSBS (needs a glucometer).  His chief complaint today is back pain; he hurt his back years ago and was successfullly treated by a chiropractor. He is not sure if he injured his back but he now has AM pain betw/ his shoulder blades- pain is "8"/10. Pt  Also c/o legs "hurting a lot". He has temporary relief w/ OTC meds and aspirin. He is very  Concerned because he lives alone and may be so incapacitated that he cannot get out of bed. ROS is negative for fever/chills, hematuria, change in bowel movements, loss of use of arms or legs, paresthesias, weakness, loss of coordination, tremor or syncope.  Pt also has Dupuytren's contracture of both hands; he reports worsening hand/palm pain with occasional spasms in hands.  Pt's anxiety state is unchanged; he has heightened anxiety as noted above associated with concerns about DM. His anxiety, however, is not motivation enough to focus on lifestyle changes. Sleep hygiene is poor due to anxiety; pt is up to early hours of the morning often watching television and eating.  Skin tags on pt's back are irritating him; he has 1 tag on L side of back that he reports bleeds. He would like to have it removed.   Patient Active Problem List   Diagnosis Date Noted  . Cavus deformity of foot, acquired 08/20/2012  . HYPERLIPIDEMIA  04/30/2008  . DEPRESSION 04/30/2008  . GERD 04/30/2008  . HEMATOCHEZIA 04/30/2008  . FATIGUE 04/30/2008  . Anxiety State, Unspecified 11/01/2007    PMHx, Soc Hx and Fam Hx reviewed.    O: Filed Vitals:   04/03/13 1511  BP: 146/86  Pulse: 105  Temp: 97.9 F (36.6 C)  Resp: 16   GEN: In NAD; WN,WD. Pt is diaphoretic. HEENT: /AT; EOMI w/ injected conj and clear sclerae. EACs/TMs /nose and oroph normal. Sinuses nontender w/ percussion. NECK: Supple w/o LAN or TMG. COR: Tachycardic; no m/g/r. LUNGS: CTA; no wheezes or rhonchi. Normal resp rate and effort. SKIN: Clammy; erythema noted on face and trunk. MS: Hands- thickened fibrous bands in both palms. Feet- high arches w/ hammer toes; no ulcerations or corns or callouses. See DM foot exam. BACK: No CVAT; spine straight. Paravertebral muscle spasms/ tender rhomboids with moderate palpation. ROM normal in shoulder w/ restrictions w/ internal rotation. NEURO: A&O x 3; CNs intact. Normal muscle tone and strength. Sensation intact. DTRs 1-2+/=. PSYCH: Anxious but not agitated; inattentive at times and tangential speech pattern. Thought content appropriate.      [facility] reading (PRIMARY) by  Dr. Audria Nine: Thoracic spine- mild scoliosis w/ paravertebral muscle spasms; vertebra appear osteopenic.  Lung fields -no active disease; sub-optimal inspiratory effort.   A1c= 7.2%   A/P: Type II or unspecified type diabetes mellitus without mention of complication, not stated as uncontrolled - Plan: POCT glycosylated hemoglobin (Hb A1C), Microalbumin, urine, POCT glucose (manual entry), CANCELED: Basic metabolic panel  Back pain, thoracic - advised OTC NSAIDs, moist heat and topical analgesic; modify activity but avoid absolute bedrest. RX: Ultracet 1 tab every 6-8 hours prn pain.      Plan: DG Thoracic Spine 2 View  Dupuytren's contracture of both hands- advised ORTHO  evaluation; pt declined at this time.  Anxiety state, unspecified- chronic  Unspecified vitamin D deficiency- Vit D=15 in August 2013; pt no longer taking Vit D supplement.  Skin tag- pt advised to walk in at [facility] for removal; it was suggested he call ahead to see what the wait time is. He voiced understanding.    Meds ordered this encounter  Medications  . metFORMIN (GLUCOPHAGE-XR) 500 MG 24 hr tablet    Sig: Take 500 mg by mouth daily with breakfast. NEED REFILLS  . DISCONTD: amphetamine-dextroamphetamine (ADDERALL) 15 MG tablet    Sig: Take 15 mg by mouth daily. NEED REFILLS  . DISCONTD: ALPRAZolam (XANAX) 0.5 MG tablet    Sig: Take 1 or 2 tablets at bedtime as needed for sleep. NEED REFILL  . DISCONTD: omeprazole (PRILOSEC) 40 MG capsule    Sig: Take 40 mg by mouth daily. PATIENT NEEDS OFFICE VISIT FOR ADDITIONAL REFILLS  NEED REFILLS  . ALPRAZolam (XANAX) 0.5 MG tablet    Sig: Take 1-2 tablets at bedtime as needed for sleep.    Dispense:  60 tablet    Refill:  1  . DISCONTD: amphetamine-dextroamphetamine (ADDERALL) 15 MG tablet    Sig: Take 1 tablet (15 mg total) by mouth daily.    Dispense:  30 tablet    Refill:  0  . amphetamine-dextroamphetamine (ADDERALL) 15 MG tablet    Sig: Take 1 tablet (15 mg total) by mouth daily.    Dispense:  30 tablet    Refill:  0    Do not fill before May 04, 2013.  . traMADol-acetaminophen (ULTRACET) 37.5-325 MG per tablet    Sig: Take 1 tablet by mouth every 6 (six) hours as needed for pain.    Dispense:  40 tablet    Refill:  0  . omeprazole (PRILOSEC) 40 MG capsule    Sig: Take 1 capsule (40 mg total) by mouth daily.    Dispense:  30 capsule    Refill:  5

## 2013-04-03 NOTE — Patient Instructions (Addendum)
1800 Calorie Diet for Diabetes Meal Planning The 1800 calorie diet is designed for eating up to 1800 calories each day. Following this diet and making healthy meal choices can help improve overall health. This diet controls blood sugar (glucose) levels and can also help lower blood pressure and cholesterol. SERVING SIZES Measuring foods and serving sizes helps to make sure you are getting the right amount of food. The list below tells how big or small some common serving sizes are:  1 oz.........4 stacked dice.  3 oz........Marland KitchenDeck of cards.  1 tsp.......Marland KitchenTip of little finger.  1 tbs......Marland KitchenMarland KitchenThumb.  2 tbs.......Marland KitchenGolf ball.   cup......Marland KitchenHalf of a fist.  1 cup.......Marland KitchenA fist. GUIDELINES FOR CHOOSING FOODS The goal of this diet is to eat a variety of foods and limit calories to 1800 each day. This can be done by choosing foods that are low in calories and fat. The diet also suggests eating small amounts of food frequently. Doing this helps control your blood glucose levels so they do not get too high or too low. Each meal or snack may include a protein food source to help you feel more satisfied and to stabilize your blood glucose. Try to eat about the same amount of food around the same time each day. This includes weekend days, travel days, and days off work. Space your meals about 4 to 5 hours apart and add a snack between them if you wish.  For example, a daily food plan could include breakfast, a morning snack, lunch, dinner, and an evening snack. Healthy meals and snacks include whole grains, vegetables, fruits, lean meats, poultry, fish, and dairy products. As you plan your meals, select a variety of foods. Choose from the bread and starch, vegetable, fruit, dairy, and meat/protein groups. Examples of foods from each group and their suggested serving sizes are listed below. Use measuring cups and spoons to become familiar with what a healthy portion looks like. Bread and Starch Each serving  equals 15 grams of carbohydrates.  1 slice bread.   bagel.   cup cold cereal (unsweetened).   cup hot cereal or mashed potatoes.  1 small potato (size of a computer mouse).   cup cooked pasta or rice.   English muffin.  1 cup broth-based soup.  3 cups of popcorn.  4 to 6 whole-wheat crackers.   cup cooked beans, peas, or corn. Vegetable Each serving equals 5 grams of carbohydrates.   cup cooked vegetables.  1 cup raw vegetables.   cup tomato or vegetable juice. Fruit Each serving equals 15 grams of carbohydrates.  1 small apple or orange.  1 cup watermelon or strawberries.   cup applesauce (no sugar added).  2 tbs raisins.   banana.   cup canned fruit, packed in water, its own juice, or sweetened with a sugar substitute.   cup unsweetened fruit juice. Dairy Each serving equals 12 to 15 grams of carbohydrates.  1 cup fat-free milk.  6 oz artificially sweetened yogurt or plain yogurt.  1 cup low-fat buttermilk.  1 cup soy milk.  1 cup almond milk. Meat/Protein  1 large egg.  2 to 3 oz meat, poultry, or fish.   cup low-fat cottage cheese.  1 tbs peanut butter.  1 oz low-fat cheese.   cup tuna in water.   cup tofu. Fat  1 tsp oil.  1 tsp trans-fat-free margarine.  1 tsp butter.  1 tsp mayonnaise.  2 tbs avocado.  1 tbs salad dressing.  1 tbs cream cheese.  2 tbs sour cream. SAMPLE 1800 CALORIE DIET PLAN Breakfast   cup unsweetened cereal (1 carb serving).  1 cup fat-free milk (1 carb serving).  1 slice whole-wheat toast (1 carb serving).   small banana (1 carb serving).  1 scrambled egg.  1 tsp trans-fat-free margarine. Lunch  Tuna sandwich.  2 slices whole-wheat bread (2 carb servings).   cup canned tuna in water, drained.  1 tbs reduced fat mayonnaise.  1 stalk celery, chopped.  2 slices tomato.  1 lettuce leaf.  1 cup carrot sticks.  24 to 30 seedless grapes (2 carb  servings).  6 oz light yogurt (1 carb serving). Afternoon Snack  3 graham cracker squares (1 carb serving).  Fat-free milk, 1 cup (1 carb serving).  1 tbs peanut butter. Dinner  3 oz salmon, broiled with 1 tsp oil.  1 cup mashed potatoes (2 carb servings) with 1 tsp trans-fat-free margarine.  1 cup fresh or frozen green beans.  1 cup steamed asparagus.  1 cup fat-free milk (1 carb serving). Evening Snack  3 cups air-popped popcorn (1 carb serving).  2 tbs parmesan cheese sprinkled on top. MEAL PLAN Use this worksheet to help you make a daily meal plan based on the 1800 calorie diet suggestions. If you are using this plan to help you control your blood glucose, you may interchange carbohydrate-containing foods (dairy, starches, and fruits). Select a variety of fresh foods of varying colors and flavors. The total amount of carbohydrate in your meals or snacks is more important than making sure you include all of the food groups every time you eat. Choose from the following foods to build your day's meals:  8 Starches.  4 Vegetables.  3 Fruits.  2 Dairy.  6 to 7 oz Meat/Protein.  Up to 4 Fats. Your dietician can use this worksheet to help you decide how many servings and which types of foods are right for you. BREAKFAST Food Group and Servings / Food Choice Starch ________________________________________________________ Dairy _________________________________________________________ Fruit _________________________________________________________ Meat/Protein __________________________________________________ Fat ___________________________________________________________ LUNCH Food Group and Servings / Food Choice Starch ________________________________________________________ Meat/Protein __________________________________________________ Vegetable _____________________________________________________ Fruit  _________________________________________________________ Dairy _________________________________________________________ Fat ___________________________________________________________ [name] Food Group and Servings / Food Choice Starch ________________________________________________________ Meat/Protein __________________________________________________ Fruit __________________________________________________________ Dairy _________________________________________________________ [name] Food Group and Servings / Food Choice Starch _________________________________________________________ Meat/Protein ___________________________________________________ Dairy __________________________________________________________ Vegetable ______________________________________________________ Fruit ___________________________________________________________ Fat ____________________________________________________________ [name] Food Group and Servings / Food Choice Fruit __________________________________________________________ Meat/Protein ___________________________________________________ Dairy __________________________________________________________ Starch _________________________________________________________ DAILY TOTALS Starch ____________________________ Vegetable _________________________ Fruit _____________________________ Dairy _____________________________ Meat/Protein______________________ Fat _______________________________ Document Released: 05/22/2005 Document Revised: 01/22/2012 Document Reviewed: 09/15/2011 [employer] Patient Information 2014 [employer].    Back Pain, Adult Back pain is very common. The pain often gets better over time. The cause of back pain is usually not dangerous. Most people can learn to manage their back pain on their own.  HOME CARE   Stay active. Start with short walks on flat ground if you can. Try to walk farther each  day.  Do not sit, drive, or stand in one place for more than 30 minutes. Do not stay in bed.  Do not avoid exercise or work. Activity can help your back heal faster.  Be careful when you bend or lift an object. Bend at your knees, keep the object close to you, and do not twist.  Sleep on a firm mattress. Lie on your side, and bend your knees. If you lie on your back, put a pillow under your knees.  Only take medicines as told by your doctor.  Put ice on the injured area.  Put ice in a plastic bag.  Place a towel between your skin and the bag.  Leave the ice on for 15-20 minutes, 3-4 times a day for the first 2 to 3 days. After that, you can switch between ice and heat packs.  Ask your doctor about back exercises or massage.  Avoid feeling anxious or stressed. Find good ways to deal with stress, such as exercise. GET HELP RIGHT AWAY IF:   Your pain does not go away with rest or medicine.  Your pain does not go away in 1 week.  You have new problems.  You do not feel well.  The pain spreads into your legs.  You cannot control when you poop (bowel movement) or pee (urinate).  Your arms or legs feel weak or lose feeling (numbness).  You feel sick to your stomach (nauseous) or throw up (vomit).  You have belly (abdominal) pain.  You feel like you may pass out (faint). MAKE SURE YOU:   Understand these instructions.  Will watch your condition.  Will get help right away if you are not doing well or get worse. Document Released: 04/17/2008 Document Revised: 01/22/2012 Document Reviewed: 03/20/2011 [facility] Patient Information 2014 [place], [place].  I have prescribed Tramadol- Acetaminophen for back pain. You can take 1 tablet 3 times a day for back pain. Mosit heat and topical creams are helpful also. Try to do some gentle stretching everyday starting on Monday to help loosen the muscles in your back/torso/major joints. Continue taking Vitamin D and get  Calcium-Magnesium-Zinc supplement and take 1 tablet once or twice a day.  You can walk-in at [facility] to have the skin tag removed from your back.

## 2013-04-04 ENCOUNTER — Ambulatory Visit (INDEPENDENT_AMBULATORY_CARE_PROVIDER_SITE_OTHER): Payer: Medicare Other | Admitting: Family Medicine

## 2013-04-04 VITALS — BP 120/98 | HR 69 | Temp 98.5°F | Resp 16 | Ht 71.0 in | Wt 228.0 lb

## 2013-04-04 DIAGNOSIS — L918 Other hypertrophic disorders of the skin: Secondary | ICD-10-CM

## 2013-04-04 DIAGNOSIS — L909 Atrophic disorder of skin, unspecified: Secondary | ICD-10-CM

## 2013-04-04 LAB — BASIC METABOLIC PANEL
BUN: 17 mg/dL (ref 6–23)
Chloride: 100 mEq/L (ref 96–112)
Potassium: 4.2 mEq/L (ref 3.5–5.3)
Sodium: 136 mEq/L (ref 135–145)

## 2013-04-04 NOTE — Progress Notes (Signed)
   [address] [place] [address]   Phone [phone]  Subjective:    Patient ID: [name], male    DOB: 30-Oct-1961, 52 y.o.   MRN: [id number]  HPI Pt presents to clinic with wanting multiple skin tags removed.  He states that they bother him because they bleed.  He is unsure which one is currently bleeding.  He does not scratch them.   Review of Systems  Skin:       Multiple skin tags  All other systems reviewed and are negative.       Objective:   Physical Exam  Vitals reviewed. Constitutional: He is oriented to person, place, and time. He appears well-developed and well-nourished.  Pulmonary/Chest: Effort normal.  Neurological: He is alert and oriented to person, place, and time.  Skin: Skin is warm and dry.     Multiple skin tags - none irritated.  Psychiatric: He has a normal mood and affect. His behavior is normal. Judgment and thought content normal.    Procedure: Consent obtained. 4 of the skin tags wiped with ETOH and sniped off with iris scissors and then band-aid placed on area.  The largest skin tag base local anesthesia with 2% lido with epi.  Removed without difficulty.  Drsg placed.      Assessment & Plan:  Cutaneous skin tags -Removed 5 skin tags-  Plan: Dermatology pathology.  Wound care d/w pt.  Due to patient having Medicare - Dr Patsy Lager evaluated the patient prior to procedure.  Benny Lennert [facility] 04/04/2013 6:38 PM

## 2013-04-04 NOTE — Progress Notes (Signed)
I have examined this patient and agree with [name]'s assessment and plan to remove skin tags.  I was directly involved in his plan of care.

## 2013-04-07 NOTE — Progress Notes (Signed)
Quick Note:  Please contact pt and advise that the following labs are abnormal...  Blood sugar is above normal (this was expected- we discussed food choices, weight gain and lack of exercise). Kidney function, sodium and potassium are normal.  Copy to pt. ______

## 2013-04-14 ENCOUNTER — Other Ambulatory Visit: Payer: Self-pay | Admitting: Physician Assistant

## 2013-05-16 ENCOUNTER — Emergency Department (HOSPITAL_COMMUNITY)
Admission: EM | Admit: 2013-05-16 | Discharge: 2013-05-16 | Disposition: A | Discharge status: Home / Self Care | Payer: Medicare Other | Attending: Emergency Medicine | Admitting: Emergency Medicine

## 2013-05-16 ENCOUNTER — Encounter (HOSPITAL_COMMUNITY): Payer: Self-pay | Admitting: Emergency Medicine

## 2013-05-16 DIAGNOSIS — F17200 Nicotine dependence, unspecified, uncomplicated: Secondary | ICD-10-CM | POA: Insufficient documentation

## 2013-05-16 DIAGNOSIS — Z7982 Long term (current) use of aspirin: Secondary | ICD-10-CM | POA: Insufficient documentation

## 2013-05-16 DIAGNOSIS — F411 Generalized anxiety disorder: Secondary | ICD-10-CM | POA: Insufficient documentation

## 2013-05-16 DIAGNOSIS — M129 Arthropathy, unspecified: Secondary | ICD-10-CM | POA: Insufficient documentation

## 2013-05-16 DIAGNOSIS — I10 Essential (primary) hypertension: Secondary | ICD-10-CM | POA: Insufficient documentation

## 2013-05-16 DIAGNOSIS — Z79899 Other long term (current) drug therapy: Secondary | ICD-10-CM | POA: Insufficient documentation

## 2013-05-16 DIAGNOSIS — F419 Anxiety disorder, unspecified: Secondary | ICD-10-CM

## 2013-05-16 DIAGNOSIS — R42 Dizziness and giddiness: Secondary | ICD-10-CM | POA: Insufficient documentation

## 2013-05-16 DIAGNOSIS — E119 Type 2 diabetes mellitus without complications: Secondary | ICD-10-CM | POA: Insufficient documentation

## 2013-05-16 HISTORY — DX: Anxiety disorder, unspecified: F41.9

## 2013-05-16 HISTORY — DX: Unspecified osteoarthritis, unspecified site: M19.90

## 2013-05-16 LAB — POCT I-STAT, CHEM 8
BUN: 14 mg/dL (ref 6–23)
Calcium, Ion: 1.22 mmol/L (ref 1.12–1.23)
Chloride: 102 mEq/L (ref 96–112)
Creatinine, Ser: 0.8 mg/dL (ref 0.50–1.35)
Sodium: 139 mEq/L (ref 135–145)
TCO2: 26 mmol/L (ref 0–100)

## 2013-05-16 NOTE — ED Notes (Signed)
[id number]<AV> Expected date:<BR> Expected time:<BR> Means of arrival:<BR> Comments:<BR> [place]

## 2013-05-16 NOTE — ED Notes (Addendum)
Pt presents complaining of dizziness, anxiety, and hypertension. Pt states he hasn't had a history of hypertension, however he was told a few days ago by a doctor and dentist that his blood pressure was elevated. Pt denies headache. Pt blood pressure on arrival is 138/88. Pt states he was recommended to control his blood pressure through diet and exercise. Pt states he is concerned about controlling his blood pressure due to risk for stroke. Pt is A/O x4, intact neurologically without deficits, NAD.

## 2013-05-16 NOTE — ED Provider Notes (Addendum)
History    CSN: [id number] Arrival date & time 05/16/13  1751  First MD Initiated Contact with Patient 05/16/13 1829     Chief Complaint  Patient presents with  . Dizziness  . Anxiety  . Hypertension   (Consider location/radiation/quality/duration/timing/severity/associated sxs/prior Treatment) HPI Patient presents with concern of high blood pressure. He states that he was told yesterday by his dentist and his blood pressure was elevated in the ED to have it under better control to have tooth extracted. He has been seen by his primary doctor over the past couple of months and had mildly elevated blood pressure and was told to control this with diet and exercise. He says he's been trying to walk on the treadmill but this does not seem to be helping his blood pressure. He denies any chest pain shortness of breath severe headache weakness of arms or legs changes in vision or speech, no confusion.   He states he has significant anxiety over this.  He also states he has stopped taking his other medications for fear that these will raise his blood pressure.  There are no other associated systemic symptoms, there are no other alleviating or modifying factors.  Past Medical History  Diagnosis Date  . Diabetes mellitus   . Anxiety   . Arthritis    Past Surgical History  Procedure Laterality Date  . Varicose vein surgery      bilateral   No family history on file. History  Substance Use Topics  . Smoking status: Current Some Day Smoker    Types: Cigars  . Smokeless tobacco: Never Used  . Alcohol Use: No    Review of Systems ROS reviewed and all otherwise negative except for mentioned in HPI  Allergies  Review of patient's allergies indicates no known allergies.  Home Medications   Current Outpatient Rx  Name  Route  Sig  Dispense  Refill  . ALPRAZolam (XANAX) 0.5 MG tablet   Oral   Take 0.5-1 mg by mouth at bedtime as needed for sleep.         Marland Kitchen  amphetamine-dextroamphetamine (ADDERALL) 15 MG tablet   Oral   Take 7.5-15 mg by mouth daily.         Marland Kitchen aspirin 325 MG tablet   Oral   Take 325 mg by mouth daily.         . metFORMIN (GLUCOPHAGE-XR) 500 MG 24 hr tablet   Oral   Take 500 mg by mouth daily with breakfast. NEED REFILLS         . omeprazole (PRILOSEC) 40 MG capsule   Oral   Take 1 capsule (40 mg total) by mouth daily.   30 capsule   5   . Vitamin D, Ergocalciferol, (DRISDOL) 50000 UNITS CAPS   Oral   Take 1 capsule (50,000 Units total) by mouth every 7 (seven) days.   4 capsule   5    BP 154/105  Pulse 73  Temp(Src) 98.3 F (36.8 C) (Oral)  Resp 20  SpO2 100% Vitals reviewed Physical Exam Physical Examination: General appearance - alert, well appearing, and in no distress Mental status - alert, oriented to person, place, and time Eyes - pupils equal and reactive, extraocular eye movements intact Neck - supple, no significant adenopathy Chest - clear to auscultation, no wheezes, rales or rhonchi, symmetric air entry Heart - normal rate, regular rhythm, normal S1, S2, no murmurs, rubs, clicks or gallops Abdomen - soft, nontender, nondistended, no masses or organomegaly  Neurological - alert, oriented, normal speech, cranial nerves 2-12 tested and intact, strength 5/5 in extremities x 4, sensation intact Extremities - peripheral pulses normal, no pedal edema, no clubbing or cyanosis Skin - normal coloration and turgor, no rashes Psych- anxious, but cooperative  ED Course  Procedures (including critical care time)   Date: 05/16/2013  Rate: 78  Rhythm: normal sinus rhythm  QRS Axis: normal  Intervals: normal  ST/T Wave abnormalities: poor r wave progression  Conduction Disutrbances:none  Narrative Interpretation:   Old EKG Reviewed: none available  Labs Reviewed  POCT I-STAT, CHEM 8 - Abnormal; Notable for the following:    Glucose, Bld 191 (*)    Hemoglobin 17.3 (*)    All other components  within normal limits   No results found. 1. Hypertension   2. Anxiety     MDM  Pt presenting with c/o concern for hypertension.  He does not have any signs of end organ damage on exam or lab workup/EKG.  Long and detailed discussion with patient about hypertension, ED workup, signs of stroke, plan and need to f/u with PMD to discuss starting an anti-hypertensive.  Pt was very anxious about his BP, long discussion as stated and every attempt made to decrease his level of anxiety.  Discharged with strict return precautions.  Pt agreeable with plan.  Ethelda Chick, MD 05/16/13 1610  Ethelda Chick, MD 05/16/13 2226

## 2013-05-22 ENCOUNTER — Encounter: Payer: Self-pay | Admitting: Family Medicine

## 2013-05-22 ENCOUNTER — Ambulatory Visit (INDEPENDENT_AMBULATORY_CARE_PROVIDER_SITE_OTHER): Payer: Medicare Other | Admitting: Family Medicine

## 2013-05-22 VITALS — BP 152/94 | HR 86 | Temp 98.2°F | Resp 16 | Ht 71.5 in | Wt 225.8 lb

## 2013-05-22 DIAGNOSIS — I10 Essential (primary) hypertension: Secondary | ICD-10-CM

## 2013-05-22 DIAGNOSIS — R0981 Nasal congestion: Secondary | ICD-10-CM

## 2013-05-22 DIAGNOSIS — F411 Generalized anxiety disorder: Secondary | ICD-10-CM

## 2013-05-22 DIAGNOSIS — J3489 Other specified disorders of nose and nasal sinuses: Secondary | ICD-10-CM

## 2013-05-22 MED ORDER — ALPRAZOLAM 0.5 MG PO TABS: 0.5000 mg | ORAL_TABLET | Freq: Every evening | ORAL | Status: DC | PRN

## 2013-05-22 MED ORDER — AMPHETAMINE-DEXTROAMPHETAMINE 15 MG PO TABS: 7.5000 mg | ORAL_TABLET | Freq: Every day | ORAL | Status: DC

## 2013-05-22 MED ORDER — METFORMIN HCL ER 500 MG PO TB24: 500.0000 mg | ORAL_TABLET | Freq: Every day | ORAL | Status: DC

## 2013-05-22 MED ORDER — METOPROLOL SUCCINATE ER 25 MG PO TB24: ORAL_TABLET | ORAL | Status: DC

## 2013-05-22 NOTE — Patient Instructions (Signed)
For SINUS CONGESTION- try a product called AYR Saline Nasal Mist or AYR Allergy & Sinus Nasal Mist. Use as directed to help relieve sinus congestion.  I have prescribed a medication called Metoprolol succinate 25 mg 24 hour tablet; take this medication once a day (around the same time of day) to help lower blood pressure and prevent your heart from racing.

## 2013-05-23 NOTE — Progress Notes (Signed)
S:  This 52 y.o. Cauc male is here for recheck of elevated BP; pt was in [facility] ED on 05/16/13 w/ BP=154/105. Pt was very anxious, thinking he was going to "have a stroke". He has diaphoresis, palpitations and mild HA. He has stopped drinking sodas and reduced caffeine intake. Pt takes stimulant medication for ADD; he now takes 1/2 tablet or less since episode with elevated BP.  Pt is requesting a low dose of BP medication.  PMHx. Soc Hx and Problem List reviewed.  ROS: As per HPI; negative for fatigue, loss of appetite, vision disturbances, CP or tightness, SOB or DOE, edema, cough, abd pain, myalgias, dizziness, numbness,weakness or syncope.  O: Filed Vitals:   05/22/13 1535  BP: 152/94  Pulse: 86  Temp: 98.2 F (36.8 C)  Resp: 16   GEN: In NAD; Mildly anxious and diaphoretic. HENT: [place]/AT; EOMI w/ clear conj/ sclerae. NT sinuses. Otherwise unremarkable. NECK: No LAN or TMG. COR: RRR. No m/g/r. LUNGS: CTA. Normal resp rate and effort. SKIN: No rashes or pallor. Mild erythema noted. NEURO: A&O x 3; Cns intact. Nonfocal.  A/P: HTN, goal below 140/90- Continue nutrition changes and weight reduction. RX: Metoprolol succ 25 mg 24 hr tablet  Take 1 tablet daily.  Generalized anxiety disorder- Continue current medications.  Sinus congestion- Try OTC AYR product for allergies and congestion.  Meds ordered this encounter  Medications  . DISCONTD: metFORMIN (GLUCOPHAGE-XR) 500 MG 24 hr tablet    Sig: Take 1 tablet (500 mg total) by mouth daily with breakfast. NEED REFILLS    Dispense:  30 tablet    Refill:  5  . ALPRAZolam (XANAX) 0.5 MG tablet    Sig: Take 1-2 tablets (0.5-1 mg total) by mouth at bedtime as needed for sleep.    Dispense:  30 tablet    Refill:  1  . amphetamine-dextroamphetamine (ADDERALL) 15 MG tablet    Sig: Take 0.5-1 tablets (7.5-15 mg total) by mouth daily.    Dispense:  30 tablet    Refill:  0  . metoprolol succinate (TOPROL-XL) 25 MG 24 hr tablet    Sig: Take 1  tablet by mouth every day to lower blood pressure.    Dispense:  30 tablet    Refill:  5  . metFORMIN (GLUCOPHAGE-XR) 500 MG 24 hr tablet    Sig: Take 1 tablet (500 mg total) by mouth daily with breakfast.    Dispense:  30 tablet    Refill:  5

## 2013-06-03 ENCOUNTER — Ambulatory Visit: Payer: Self-pay | Admitting: Family Medicine

## 2013-06-12 ENCOUNTER — Ambulatory Visit: Payer: Medicare Other | Admitting: Family Medicine

## 2013-08-28 ENCOUNTER — Ambulatory Visit: Payer: Medicare Other | Admitting: Family Medicine

## 2013-09-04 ENCOUNTER — Ambulatory Visit: Payer: Medicare Other | Admitting: Family Medicine

## 2013-09-23 ENCOUNTER — Other Ambulatory Visit: Payer: Self-pay | Admitting: Family Medicine

## 2014-01-05 ENCOUNTER — Other Ambulatory Visit: Payer: Self-pay | Admitting: Family Medicine

## 2014-01-05 ENCOUNTER — Telehealth: Payer: Self-pay

## 2014-01-05 NOTE — Telephone Encounter (Signed)
Pt needs a refill of his "stomach pills" he has one to last him until tomorrow. He has made an appointment with dr. Audria Ninemcpherson this Friday 2/27

## 2014-01-06 MED ORDER — OMEPRAZOLE 40 MG PO CPDR: 40.0000 mg | DELAYED_RELEASE_CAPSULE | Freq: Every day | ORAL | Status: DC

## 2014-01-06 NOTE — Telephone Encounter (Signed)
Sent in 1 RF of omeprazole and notified pt.

## 2014-01-07 ENCOUNTER — Telehealth: Payer: Self-pay

## 2014-01-09 ENCOUNTER — Ambulatory Visit: Payer: Medicare Other | Admitting: Family Medicine

## 2014-02-13 ENCOUNTER — Other Ambulatory Visit: Payer: Self-pay | Admitting: Family Medicine

## 2014-02-16 ENCOUNTER — Telehealth: Payer: Self-pay

## 2014-02-16 NOTE — Telephone Encounter (Signed)
Patient is needing to get his stomach pills sent in to the pharmacy.  Patient states that he is completely out and is needing to get a new refill until he can see Dr Audria NineMcPherson.    Best#: [id number]

## 2014-02-17 ENCOUNTER — Encounter: Payer: Self-pay | Admitting: Family Medicine

## 2014-02-17 ENCOUNTER — Ambulatory Visit (INDEPENDENT_AMBULATORY_CARE_PROVIDER_SITE_OTHER): Payer: Medicare Other | Admitting: Family Medicine

## 2014-02-17 VITALS — BP 154/84 | HR 110 | Temp 97.9°F | Resp 18 | Ht 71.0 in | Wt 224.0 lb

## 2014-02-17 DIAGNOSIS — Z8639 Personal history of other endocrine, nutritional and metabolic disease: Secondary | ICD-10-CM

## 2014-02-17 DIAGNOSIS — Z91199 Patient's noncompliance with other medical treatment and regimen due to unspecified reason: Secondary | ICD-10-CM | POA: Insufficient documentation

## 2014-02-17 DIAGNOSIS — M720 Palmar fascial fibromatosis [Dupuytren]: Secondary | ICD-10-CM

## 2014-02-17 DIAGNOSIS — E1149 Type 2 diabetes mellitus with other diabetic neurological complication: Secondary | ICD-10-CM

## 2014-02-17 DIAGNOSIS — E1165 Type 2 diabetes mellitus with hyperglycemia: Secondary | ICD-10-CM

## 2014-02-17 DIAGNOSIS — R42 Dizziness and giddiness: Secondary | ICD-10-CM

## 2014-02-17 DIAGNOSIS — IMO0001 Reserved for inherently not codable concepts without codable children: Secondary | ICD-10-CM

## 2014-02-17 DIAGNOSIS — Z9119 Patient's noncompliance with other medical treatment and regimen: Secondary | ICD-10-CM | POA: Insufficient documentation

## 2014-02-17 LAB — POCT GLYCOSYLATED HEMOGLOBIN (HGB A1C): Hemoglobin A1C: 10.1

## 2014-02-17 MED ORDER — ALPRAZOLAM 0.5 MG PO TABS: 0.5000 mg | ORAL_TABLET | Freq: Every evening | ORAL | Status: DC | PRN

## 2014-02-17 MED ORDER — METOPROLOL SUCCINATE ER 25 MG PO TB24: ORAL_TABLET | ORAL | Status: DC

## 2014-02-17 MED ORDER — METFORMIN HCL ER 500 MG PO TB24: 500.0000 mg | ORAL_TABLET | Freq: Every day | ORAL | Status: DC

## 2014-02-17 MED ORDER — LISINOPRIL 5 MG PO TABS: 5.0000 mg | ORAL_TABLET | Freq: Every day | ORAL | Status: DC

## 2014-02-17 MED ORDER — LISINOPRIL 5 MG PO TABS: ORAL_TABLET | ORAL | Status: DC

## 2014-02-17 NOTE — Telephone Encounter (Signed)
Omeprazole has been refilled x 1 month by S. Weber, PA-C.

## 2014-02-17 NOTE — Patient Instructions (Signed)
I have reordered the medications that you have been taking. Metformin will help lower you blood sugar; take it with breakfast as before. Try to eat healthier and avoid junk foods and processed foods.   You will be called about an appointment with a Diabetes Educator. That person can show you how to use the meter to check your blood sugar at home. You need to try to do this once a day most days of the week.  Podiat[facility]- [facility]. You can call them to schedule a follow-up.

## 2014-02-17 NOTE — Progress Notes (Signed)
Subjective:    Patient ID: [name], male    DOB: [birth year], 53 y.o.   MRN: [id number]  HPI  This 53 y.o. Cauc male has Type II DM, last seen by me in July 2014. He is not checking FSBS and not sure about meal planning. He stopped taking Metformin 1 month ago due to dizziness though he was not certain if that was the cause. He has sinus problems which cause dizziness also; not taking any medication for allergies or sinus problems. The dizziness is intermittent; no associated HA, CP or tightness, palpitations, generalized weakness or syncope.  Pt has Dupuytren's contracture in R hand > L; he c/o worsening weakness in his hands. He has symptoms c/w Diabetic neuropathy in his feet and now has symptoms that suggest same in his hands. He requests DMV placard because he has difficulty walking more than a few yards.  Pt has chronic anxiety and c/o memory issues. He is very concerned about ongoing medical problems. He has a hx of Vitamin D def= 15 ng/mL in August 2013. He is not taking a supplement and nutrition is fair, at best.  Patient Active Problem List   Diagnosis Date Noted  . Cavus deformity of foot, acquired 08/20/2012  . HYPERLIPIDEMIA 04/30/2008  . DEPRESSION 04/30/2008  . GERD 04/30/2008  . HEMATOCHEZIA 04/30/2008  . FATIGUE 04/30/2008  . Anxiety State, Unspecified 11/01/2007   Prior to Admission medications   Medication Sig Start Date End Date Taking? Authorizing Provider  ALPRAZolam Prudy Feeler) 0.5 MG tablet Take 1-2 tablets (0.5-1 mg total) by mouth at bedtime as needed for sleep.   Yes Maurice March, MD  amphetamine-dextroamphetamine (ADDERALL) 15 MG tablet Take 0.5-1 tablets (7.5-15 mg total) by mouth daily. 05/22/13  Yes Maurice March, MD  aspirin 325 MG tablet Take 325 mg by mouth daily.   Yes Historical Provider, MD  metoprolol succinate (TOPROL-XL) 25 MG 24 hr tablet Take 1 tablet by mouth every day to lower blood pressure.   No Maurice March, MD  omeprazole  (PRILOSEC) 40 MG capsule TAKE ONE CAPSULE BY MOUTH ONCE DAILY   Yes Morrell Riddle, PA-C  Vitamin D, Ergocalciferol, (DRISDOL) 50000 UNITS CAPS Take 1 capsule (50,000 Units total) by mouth every 7 (seven) days. 04/14/13  No Maurice March, MD  lisinopril (PRINIVIL,ZESTRIL) 5 MG tablet Take 1/2 tablet daily for blood pressure and kidney protection. 02/17/14   Maurice March, MD  metFORMIN (GLUCOPHAGE-XR) 500 MG 24 hr tablet Take 1 tablet (500 mg total) by mouth daily with breakfast.   No Maurice March, MD   PMHx, Surg Hx, Soc and Fam Hx reviewed.   Review of Systems  Constitutional: Positive for diaphoresis. Negative for fever, chills, appetite change and unexpected weight change.  HENT: Positive for congestion and rhinorrhea. Negative for ear pain, postnasal drip, sinus pressure, sore throat and trouble swallowing.   Eyes: Negative.   Respiratory: Negative.   Cardiovascular: Positive for palpitations. Negative for chest pain.  Musculoskeletal: Positive for back pain and gait problem.       "My feet feel like I am walking on pillows".  Skin: Negative.   Allergic/Immunologic: Positive for environmental allergies.  Neurological: Positive for dizziness. Negative for tremors, syncope, light-headedness, numbness and headaches.  Psychiatric/Behavioral: Positive for decreased concentration. Negative for suicidal ideas, behavioral problems, self-injury and agitation. The patient is nervous/anxious.       Objective:   Physical Exam  Nursing note and vitals reviewed. Constitutional: He is  oriented to person, place, and time. He appears well-developed and well-nourished. No distress.  HENT:  Head: Normocephalic and atraumatic.  Right Ear: Hearing, tympanic membrane, external ear and ear canal normal.  Left Ear: Hearing, tympanic membrane, external ear and ear canal normal.  Nose: Nose normal. No nasal deformity or septal deviation. Right sinus exhibits no maxillary sinus tenderness and  no frontal sinus tenderness. Left sinus exhibits no maxillary sinus tenderness and no frontal sinus tenderness.  Mouth/Throat: Uvula is midline, oropharynx is clear and moist and mucous membranes are normal. No oral lesions. No uvula swelling.  Eyes: Conjunctivae, EOM and lids are normal. Pupils are equal, round, and reactive to light. No scleral icterus.  Neck: Trachea normal and normal range of motion. Neck supple. No mass and no thyromegaly present.  Cardiovascular: Normal rate, regular rhythm, S1 normal, S2 normal and normal heart sounds.   No extrasystoles are present. Exam reveals no gallop.   No murmur heard. Pulmonary/Chest: Effort normal and breath sounds normal. No respiratory distress.  Musculoskeletal:       Right hand: He exhibits deformity. He exhibits normal range of motion and no tenderness. Decreased strength noted.       Left hand: Decreased strength noted.       Right foot: He exhibits deformity.       Left foot: He exhibits deformity.  Neurological: He is alert and oriented to person, place, and time. He displays no atrophy and no tremor. No cranial nerve deficit or sensory deficit. He exhibits normal muscle tone. Gait abnormal. Coordination normal.  Ambulates as if bottoms of feet hurt. See DM Foot exam.  Skin: Skin is warm, dry and intact. No bruising and no petechiae noted. He is not diaphoretic. No erythema. No pallor.  Psychiatric: His speech is normal. Thought content normal. His mood appears anxious. He is agitated. He is not slowed and not withdrawn. Cognition and memory are impaired. He is inattentive.    Results for orders placed in visit on 02/17/14  POCT GLYCOSYLATED HEMOGLOBIN (HGB A1C)      Result Value Ref Range   Hemoglobin A1C 10.1        Assessment & Plan:  Type II or unspecified type diabetes mellitus with neurological manifestations, not stated as uncontrolled -Resume Metformin once daily with breakfast. Meet w/ Diabetes educator and make necessary  changes. Need to learn to check FSBS daily.  Plan: [facility] Diabetes Foot Exam, POCT glycosylated hemoglobin (Hb A1C), COMPLETE METABOLIC PANEL WITH GFR, For home use only DME Glucometer, Ambulatory referral to diabetic education  Dizziness - BP elevated; pt noncompliant w/ medication. Also has chronic anxiety disorder. Plan: Thyroid Panel With TSH  Dupuytren's contracture - Plan: Ambulatory referral to Hand Surgery  History of vitamin D deficiency - Plan: COMPLETE METABOLIC PANEL WITH GFR  Meds ordered this encounter  Medications  . ALPRAZolam (XANAX) 0.5 MG tablet    Sig: Take 1-2 tablets (0.5-1 mg total) by mouth at bedtime as needed for sleep.    Dispense:  30 tablet    Refill:  1  . metoprolol succinate (TOPROL-XL) 25 MG 24 hr tablet    Sig: Take 1 tablet by mouth every day to lower blood pressure.    Dispense:  30 tablet    Refill:  5  . metFORMIN (GLUCOPHAGE-XR) 500 MG 24 hr tablet    Sig: Take 1 tablet (500 mg total) by mouth daily with breakfast.    Dispense:  30 tablet    Refill:  5  .  DISCONTD: lisinopril (PRINIVIL,ZESTRIL) 5 MG tablet    Sig: Take 1 tablet (5 mg total) by mouth daily.    Dispense:  30 tablet    Refill:  5  . lisinopril (PRINIVIL,ZESTRIL) 5 MG tablet    Sig: Take 1/2 tablet daily for blood pressure and kidney protection.    Dispense:  30 tablet    Refill:  3   Pt encouraged to contact the clinic when he has new concerns; he is advised against stopping medications w/o consulting medical care provider.

## 2014-02-18 LAB — COMPLETE METABOLIC PANEL WITH GFR
ALBUMIN: 4.1 g/dL (ref 3.5–5.2)
ALK PHOS: 117 U/L (ref 39–117)
ALT: 73 U/L — ABNORMAL HIGH (ref 0–53)
AST: 38 U/L — ABNORMAL HIGH (ref 0–37)
BUN: 12 mg/dL (ref 6–23)
CALCIUM: 9.3 mg/dL (ref 8.4–10.5)
CHLORIDE: 97 meq/L (ref 96–112)
CO2: 27 mEq/L (ref 19–32)
Creat: 0.91 mg/dL (ref 0.50–1.35)
GLUCOSE: 365 mg/dL — AB (ref 70–99)
POTASSIUM: 4.5 meq/L (ref 3.5–5.3)
Sodium: 135 mEq/L (ref 135–145)
Total Bilirubin: 0.6 mg/dL (ref 0.2–1.2)
Total Protein: 6.7 g/dL (ref 6.0–8.3)

## 2014-02-18 LAB — THYROID PANEL WITH TSH
Free Thyroxine Index: 2.9 (ref 1.0–3.9)
T3 UPTAKE: 31.8 % (ref 22.5–37.0)
T4, Total: 9 ug/dL (ref 5.0–12.5)
TSH: 2.122 u[IU]/mL (ref 0.350–4.500)

## 2014-02-18 NOTE — Progress Notes (Signed)
Quick Note:  Please advise pt regarding following labs... Thyroid function is normal (all results are normal ). Blood sugar is above normal (as expected with A1c= 10.1 %). Liver function results are above normal and related to uncontrolled Diabetes. You probably have fat in the liver associated with being overweight and we call this condition "fatty liver". Weight loss and control of Diabetes will help correct the liver function tests.  Copy to pt.  ______

## 2014-02-23 ENCOUNTER — Encounter: Payer: Self-pay | Admitting: Family Medicine

## 2014-03-02 ENCOUNTER — Telehealth: Payer: Self-pay

## 2014-03-02 MED ORDER — GLIPIZIDE 5 MG PO TABS: 5.0000 mg | ORAL_TABLET | Freq: Two times a day (BID) | ORAL | Status: DC

## 2014-03-02 NOTE — Telephone Encounter (Signed)
Pt.notified

## 2014-03-02 NOTE — Telephone Encounter (Signed)
Pt was put back on Metformin on 4/7. Started taking one qd. Has been having dizzy spells and LE paresthesias. Had the symptoms previously when he was on Metformin. Took 1/2 pill all last week and sxs eased up a little. Pt is worried about passing out bc he lives by himself. Stopped taking the Metformin 2 days ago. Not checking sugars. Please advise.

## 2014-03-02 NOTE — Telephone Encounter (Signed)
I have discontinued Metformin. I have prescribed generic Glipizide 5 mg 1 tablet twice a day before main meals. Pt needs to be eating as healthy as possible and drinking water as main liquid source. He needs to check sugars twice a day most days. If this medication does not work for him, he should schedule OV before his scheduled June appt.

## 2014-03-05 ENCOUNTER — Encounter: Payer: Self-pay | Admitting: *Deleted

## 2014-03-05 ENCOUNTER — Encounter: Payer: Medicare Other | Attending: Family Medicine | Admitting: *Deleted

## 2014-03-05 VITALS — Ht 71.0 in | Wt 221.9 lb

## 2014-03-05 DIAGNOSIS — E119 Type 2 diabetes mellitus without complications: Secondary | ICD-10-CM | POA: Insufficient documentation

## 2014-03-05 DIAGNOSIS — I10 Essential (primary) hypertension: Secondary | ICD-10-CM | POA: Insufficient documentation

## 2014-03-05 DIAGNOSIS — Z713 Dietary counseling and surveillance: Secondary | ICD-10-CM | POA: Insufficient documentation

## 2014-03-05 DIAGNOSIS — Z9119 Patient's noncompliance with other medical treatment and regimen: Secondary | ICD-10-CM

## 2014-03-05 DIAGNOSIS — Z91199 Patient's noncompliance with other medical treatment and regimen due to unspecified reason: Secondary | ICD-10-CM

## 2014-03-05 NOTE — Progress Notes (Signed)
Appt start time: 1030 end time:  1200.  Assessment:  Patient was seen on  03/05/14 for individual diabetes education. He has had diabetes for about 2 years. He does not have meter yet, will provide him one today. He complains of numbness in his feet which is concerning him. He lives alone so he shops and prepares his own meals. Activity is basically keeping up his yard and house work. No previous diabetes education before today. He is anxious and complaining of dizziness which gets worse when he gets upset. We had to take a break in the visit and walk around the area on this floor so he could calm down and be able to focus again.  Current HbA1c: 10.1%  Preferred Learning Style:   Visual  Hands on  Learning Readiness:   Contemplating  MEDICATIONS: see list. He was Rx'd Metformin but said it made him feel bad. He has been Rx'd Glipizide but has not picked up that Rx yet due to his fear of how it will affect him. He states he has not filled his 2 Rx's for HTN medication either for the same reasons.  DIETARY INTAKE:  24-hr recall:  B ( AM): coffee, crackers OR unsweetened cereal with whole milk, and sweetener Snk ( AM): occasionally candy  L ( PM): skips OR spaghetti OR chicken sandwich, water or regular soda Snk ( PM): same as AM D ( PM): spaghetti with meat sauce with added sugar, parmesan cheese, bread and garlic butter, soda or water, coffee Snk ( PM): not usually unless left overs  Beverages:regular soda or water, coffee  Usual physical activity: Activity is basically keeping up his yard and house work.  Estimated energy needs: 1500 calories 170 g carbohydrates 112 g protein 42 g fat  Progress Towards Goal(s):  In progress.   Nutritional Diagnosis:  NB-1.1 Food and nutrition-related knowledge deficit As related to diabetes .  As evidenced by A1c of 10.1%.    Intervention:  Nutrition counseling provided.   Due to his anxiousness, I decided to concentrate on getting him a  meter that Medicare and Medicaid will pay for the strips, and training him how to use it. I provided him with the Accu Chek Nano Meter starter kit, Lot # [id number], Expiration Date 11/12/2014  I instructed him on the use of this meter and he was able to demonstrate understanding by doing a BG check. His BG today after his breakfast was 398 mg/dl.  I recommended he test his BG every AM before his coffee and breakfast food, and to record the results in the Log Book provided, he agreed.  I explained briefly the action of the Glipizide and stressed the importance of getting that Rx filled so it will start to bring his BGs down and help him feel better.   Due to his short attention span, I plan to meet with him weekly for 30 minutes to cover one topic each time as listed below:  Diabetes disease process and treatment options.  Discussed physiology of diabetes and role of obesity on insulin resistance.  Encouraged moderate weight reduction to improve glucose levels.  Discussed role of medications and diet in glucose control  Education on macronutrients on glucose levels.  Provided education on carb counting, importance of regularly scheduled meals/snacks, and meal planning  Effects of physical activity on glucose levels and long-term glucose control.  Recommended 150 minutes of physical activity/week.  Patient medications.  Discussed role of medication on blood glucose and possible side effects  Blood glucose monitoring and interpretation.  Discussed recommended target ranges and individual ranges.    Short-term complications: hyper- and hypo-glycemia.  Discussed causes,symptoms, and treatment options.  Prevention, detection, and treatment of long-term complications.  Discussed the role of prolonged elevated glucose levels on body systems.  Role of stress on blood glucose levels and discussed strategies to manage psychosocial issues.  Recommendations for long-term diabetes self-care.  Established  checklist for medical, dental, and emotional self-care.  Plan: Start checking your blood sugar (BG) once a day in the morning before you eat or drink anything Ask pharmacist to get Rx for Accu Chek Smartview strips and Accu Chek drum lancets for your meter Be sure and pick up your Rx's for Glipizide and Lisinopril and start taking them right away   Teaching Method Utilized: Visual and Hands on  Handouts given during visit include:  Accu Chek Nano Starter Kit with written instructions Living Well with Diabetes, I asked him not to read this at home but to wait for me to explain each topic with him in person  Barriers to learning/adherence to lifestyle change: extreme paranoia and anxiousness  Diabetes self-care support plan:   [facility] support group  Demonstrated degree of understanding via:  Teach Back   Monitoring/Evaluation:  SMBG once a day every AM before food or coffee  in 1 week(s).

## 2014-03-05 NOTE — Patient Instructions (Signed)
Plan: Start checking your blood sugar (BG) once a day in the morning before you eat or drink anything Ask pharmacist to get Rx for Accu Chek Smartview strips and Accu Chek drum lancets for your meter Be sure and pick up your Rx's for Glipizide and Lisinopril and start taking them right away

## 2014-03-06 ENCOUNTER — Telehealth: Payer: Self-pay

## 2014-03-06 NOTE — Telephone Encounter (Signed)
Patient saw diabetic doctor yesterday and was told to Call Dr. Audria NineMcPherson to write script for his medication for diabetic.   [facility] on [address]  Needs the stuff to test for diabetics.   [phone]

## 2014-03-07 NOTE — Telephone Encounter (Signed)
Left message on machine to call back  

## 2014-03-08 NOTE — Telephone Encounter (Signed)
Left message on machine to call back to get more details about what exactly he's needing

## 2014-03-09 MED ORDER — GLUCOSE BLOOD VI STRP: ORAL_STRIP | Status: DC

## 2014-03-09 NOTE — Telephone Encounter (Signed)
I phoned pt's pharmacy; requested 100 strips for Accu-check glucometer w/ 5 RFs. Advise pt that this has been done.

## 2014-03-09 NOTE — Telephone Encounter (Signed)
Needs lancets (accu check machine)  And test strips.   Please send to [facility] [address].

## 2014-03-10 NOTE — Telephone Encounter (Signed)
LMVM to CB. 

## 2014-03-11 ENCOUNTER — Encounter: Payer: Medicare Other | Admitting: *Deleted

## 2014-03-11 ENCOUNTER — Telehealth: Payer: Self-pay | Admitting: *Deleted

## 2014-03-11 DIAGNOSIS — E119 Type 2 diabetes mellitus without complications: Secondary | ICD-10-CM

## 2014-03-11 NOTE — Patient Instructions (Signed)
Plan:  Aim for 4 Carb Choices per meal (60 grams) +/- 1 either way  Aim for 0-2 Carbs per snack if hungry  Include protein in moderation with your meals and snacks Consider checking BG every AM before your breakfast each day   Continue taking your diabetes and blood pressure medications as directed by MD

## 2014-03-11 NOTE — Progress Notes (Signed)
Appt start time: 1430 end time:  1530.  Assessment:  Patient was seen on  03/05/14 for individual diabetes education follow up visit. He reports he is feeling better, he did get his Rx filled for Glipizide and Lisinopril which he states he has been taking for the past week as directed. He attempted to use the Accu Chek Nano meter but had great difficulty getting a sample of blood so was unsuccessful. He brought the meter kit today so we can follow up on his technique.  Current HbA1c: 10.1%  Preferred Learning Style:   Visual  Hands on  Learning Readiness:   Contemplating  MEDICATIONS: see list. Diabetes medication is now Glipizide and he has filled the Rx for Lisinopril too  DIETARY INTAKE:  24-hr recall:  B ( AM): coffee, crackers OR unsweetened cereal with whole milk, and sweetener Snk ( AM): occasionally candy  L ( PM): skips OR spaghetti OR chicken sandwich, water or regular soda Snk ( PM): same as AM D ( PM): spaghetti with meat sauce with added sugar, parmesan cheese, bread and garlic butter, soda or water, coffee Snk ( PM): not usually unless left overs  Beverages:regular soda or water, coffee  Usual physical activity: Activity is basically keeping up his yard and house work.  Estimated energy needs: 1500 calories 170 g carbohydrates 112 g protein 42 g fat  Progress Towards Goal(s):  In progress.   Nutritional Diagnosis:  NB-1.1 Food and nutrition-related knowledge deficit As related to diabetes .  As evidenced by A1c of 10.1%.    Intervention:  Nutrition counseling provided.   He was somewhat less anxious today, I commended him on getting the Rx filled and taking the Glipizide and Lisinopril.  I reviewed his technique and assisted him with the Fast Clix lancing device. We were able to get an adequate sample and the result was 276 2 hours after a lunch of spaghetti. This is a 120 mg/dl drop over a post breakfast reading last week.  He will attempt to get his Rx  filled for more strips and Accu Chek drums so he can continue checking each AM   Moved into carb counting based on food groups and recommended 4 Carb Choices (= 60 grams) per meal. He demonstrated understanding with use of food models and verbal understanding.   Diabetes disease process and treatment options.  Discussed physiology of diabetes and role of obesity on insulin resistance.  Encouraged moderate weight reduction to improve glucose levels.  Discussed role of medications and diet in glucose control  Due to his short attention span, I plan to meet with him weekly for 30 minutes to cover one topic each time as listed below:  Effects of physical activity on glucose levels and long-term glucose control.  Recommended 150 minutes of physical activity/week.  Blood glucose monitoring and interpretation.  Discussed recommended target ranges and individual ranges.    Short-term complications: hyper- and hypo-glycemia.  Discussed causes,symptoms, and treatment options.  Prevention, detection, and treatment of long-term complications.  Discussed the role of prolonged elevated glucose levels on body systems.  Role of stress on blood glucose levels and discussed strategies to manage psychosocial issues.  Recommendations for long-term diabetes self-care.  Established checklist for medical, dental, and emotional self-care.  Plan:  Aim for 4 Carb Choices per meal (60 grams) +/- 1 either way  Aim for 0-2 Carbs per snack if hungry  Include protein in moderation with your meals and snacks Consider checking BG every AM before your  breakfast each day   Continue taking your diabetes and blood pressure medications as directed by MD    Teaching Method Utilized: Visual and Hands on  Handouts given during visit include: A1c handout with additional information written on back to explain target ranges and insulin action.  Barriers to learning/adherence to lifestyle change: extreme paranoia and  anxiousness  Diabetes self-care support plan:   [facility] support group  Demonstrated degree of understanding via:  Teach Back   Monitoring/Evaluation:  SMBG once a day every AM before food or coffee  in 1 -2 week(s).

## 2014-03-11 NOTE — Telephone Encounter (Signed)
LMVM to CB. 

## 2014-03-11 NOTE — Telephone Encounter (Signed)
See previous phone message. Let pt know that we phoned this in and that spoke with pharm to make sure they fill the one that his ins will pay for

## 2014-03-11 NOTE — Telephone Encounter (Signed)
Pt. Called and was very upset. He stated that we called him and left a voicemail before 10 am, which he said he has asked us not to do. He also wishes that when we leave him a voicemail, that we would say why we're calling. Please call patient @ [phone], if he is not available, please leave detailed message.

## 2014-03-13 ENCOUNTER — Other Ambulatory Visit: Payer: Self-pay | Admitting: Physician Assistant

## 2014-03-17 ENCOUNTER — Other Ambulatory Visit: Payer: Self-pay

## 2014-03-17 MED ORDER — GLUCOSE BLOOD VI STRP: ORAL_STRIP | Status: DC

## 2014-03-17 MED ORDER — LANCETS MISC: Status: DC

## 2014-03-25 ENCOUNTER — Ambulatory Visit: Payer: Medicare Other | Admitting: *Deleted

## 2014-04-06 ENCOUNTER — Other Ambulatory Visit: Payer: Self-pay | Admitting: Family Medicine

## 2014-04-07 NOTE — Telephone Encounter (Signed)
Omeprazole refilled for 6 months

## 2014-04-07 NOTE — Telephone Encounter (Signed)
Dr Audria Nine, you saw pt in Apr for other chronic problems but not for this med. Do you want to give RFs?

## 2014-04-21 ENCOUNTER — Encounter: Payer: Self-pay | Admitting: Family Medicine

## 2014-04-21 ENCOUNTER — Ambulatory Visit (INDEPENDENT_AMBULATORY_CARE_PROVIDER_SITE_OTHER): Payer: Medicare Other | Admitting: Family Medicine

## 2014-04-21 VITALS — BP 134/90 | HR 71 | Temp 98.6°F | Resp 16 | Ht 71.5 in | Wt 227.0 lb

## 2014-04-21 DIAGNOSIS — L719 Rosacea, unspecified: Secondary | ICD-10-CM

## 2014-04-21 DIAGNOSIS — E1165 Type 2 diabetes mellitus with hyperglycemia: Principal | ICD-10-CM

## 2014-04-21 DIAGNOSIS — F411 Generalized anxiety disorder: Secondary | ICD-10-CM

## 2014-04-21 DIAGNOSIS — Z760 Encounter for issue of repeat prescription: Secondary | ICD-10-CM

## 2014-04-21 DIAGNOSIS — IMO0001 Reserved for inherently not codable concepts without codable children: Secondary | ICD-10-CM

## 2014-04-21 MED ORDER — AMPHETAMINE-DEXTROAMPHETAMINE 15 MG PO TABS: 7.5000 mg | ORAL_TABLET | Freq: Every day | ORAL | Status: DC

## 2014-04-21 MED ORDER — VITAMIN D (ERGOCALCIFEROL) 1.25 MG (50000 UNIT) PO CAPS: 50000.0000 [IU] | ORAL_CAPSULE | ORAL | Status: DC

## 2014-04-21 MED ORDER — GLIPIZIDE 5 MG PO TABS: 5.0000 mg | ORAL_TABLET | Freq: Two times a day (BID) | ORAL | Status: DC

## 2014-04-21 MED ORDER — ALPRAZOLAM 0.5 MG PO TABS: 0.5000 mg | ORAL_TABLET | Freq: Every evening | ORAL | Status: DC | PRN

## 2014-04-21 MED ORDER — LISINOPRIL 5 MG PO TABS: ORAL_TABLET | ORAL | Status: DC

## 2014-04-22 DIAGNOSIS — L719 Rosacea, unspecified: Secondary | ICD-10-CM | POA: Insufficient documentation

## 2014-04-22 NOTE — Progress Notes (Signed)
S: This 53 y.o. Cauc male is here for medication refills. He has Type II DM and is improving nutrition and compliance w/ medications. Last A1c= 10.1% on February 17, 2014. He sees Pincus Large, RD at [facility] and is very comfortable w/ her; she has helped him immensely. He is much calmer today and feels better than he has in several months. No episodes of hypoglycemia; FSBS more stable since eliminating sodas. His foot pain has decreased.  He has rosacea and would like to know what are the newest treatments. He has tried topicals and oral medication. He is very self-conscious about his skin and has increased redness and papules around the neck area.  Patient Active Problem List   Diagnosis Date Noted  . Noncompliance with diabetes treatment 02/17/2014  . Cavus deformity of foot, acquired 08/20/2012  . HYPERLIPIDEMIA 04/30/2008  . DEPRESSION 04/30/2008  . GERD 04/30/2008  . HEMATOCHEZIA 04/30/2008  . FATIGUE 04/30/2008  . Anxiety State, Unspecified 11/01/2007   Prior to Admission medications   Medication Sig Start Date End Date Taking? Authorizing Provider  ALPRAZolam Prudy Feeler) 0.5 MG tablet Take 1-2 tablets (0.5-1 mg total) by mouth at bedtime as needed for sleep. 04/21/14  Yes Maurice March, MD  amphetamine-dextroamphetamine (ADDERALL) 15 MG tablet Take 0.5-1 tablets (7.5-15 mg total) by mouth daily.     Yes Maurice March, MD  aspirin 325 MG tablet Take 325 mg by mouth daily.   Yes Historical Provider, MD  glipiZIDE (GLUCOTROL) 5 MG tablet Take 1 tablet (5 mg total) by mouth 2 (two) times daily before a meal.   Yes Maurice March, MD  glucose blood test strip Test blood sugar daily. Dx code: 250.60. 03/17/14  Yes Godfrey Pick, PA-C  Lancets MISC Test blood sugar daily. Dx code 250.60 03/17/14  Yes Eleanore Delia Chimes, PA-C  lisinopril (PRINIVIL,ZESTRIL) 5 MG tablet Take 1/2 tablet daily for blood pressure and kidney protection.   Yes Maurice March, MD   metoprolol succinate (TOPROL-XL) 25 MG 24 hr tablet Take 1 tablet by mouth every day to lower blood pressure. 02/17/14  Yes Maurice March, MD  omeprazole (PRILOSEC) 40 MG capsule TAKE ONE CAPSULE BY MOUTH ONCE DAILY   Yes Maurice March, MD  Vitamin D, Ergocalciferol, (DRISDOL) 50000 UNITS CAPS capsule Take 1 capsule (50,000 Units total) by mouth every 7 (seven) days.   Yes Maurice March, MD   PMHx, Surg Hx, Soc and Fam Hx reviewed.  ROS: As per HPI; negative for fatigue, vision disturbances, abnormal weight loss, CP or tightness, palpitations, SOB or DOE, cough, GI problems, polyuria or frequency/dysuria, HA, dizziness, numbness, weakness or syncope.  O: Filed Vitals:   04/21/14 1539  BP: 134/90  Pulse: 71  Temp: 98.6 F (37 C)  Resp: 16    GEN: In NAD; WN,WD. Mildly diaphoretic. Weight up 3 lbs. HENT: [place]/AT; EOMI w/ clear conj/sclerae. Otherwise unremarkable. COR: RRR. LUNGS: Unlabored resp. SKIN: Intact; erythema and papular lesions on face and neck. NEURO: A&O x 3; CNs intact. Nonfocal.  A/P: Type II or unspecified type diabetes mellitus without mention of complication, uncontrolled- Will check A1c at next visit; follow-up w/ Meriam Sprague for DM education.  Rosacea - Plan: Ambulatory referral to Dermatology  Anxiety state, unspecified- Stable on current medications.  Issue of repeat prescriptions  Meds ordered this encounter  Medications  . ALPRAZolam (XANAX) 0.5 MG tablet    Sig: Take 1-2 tablets (0.5-1 mg total) by mouth at  bedtime as needed for sleep.    Dispense:  30 tablet    Refill:  2  . amphetamine-dextroamphetamine (ADDERALL) 15 MG tablet    Sig: Take 0.5-1 tablets (7.5-15 mg total) by mouth daily.    Dispense:  30 tablet    Refill:  0  . glipiZIDE (GLUCOTROL) 5 MG tablet    Sig: Take 1 tablet (5 mg total) by mouth 2 (two) times daily before a meal.    Dispense:  60 tablet    Refill:  3  . lisinopril (PRINIVIL,ZESTRIL) 5 MG tablet    Sig: Take  1/2 tablet daily for blood pressure and kidney protection.    Dispense:  30 tablet    Refill:  3  . Vitamin D, Ergocalciferol, (DRISDOL) 50000 UNITS CAPS capsule    Sig: Take 1 capsule (50,000 Units total) by mouth every 7 (seven) days.    Dispense:  4 capsule    Refill:  5

## 2014-04-23 ENCOUNTER — Encounter: Payer: Self-pay | Admitting: *Deleted

## 2014-04-23 ENCOUNTER — Encounter: Payer: Medicare Other | Attending: Family Medicine | Admitting: *Deleted

## 2014-04-23 VITALS — Ht 71.0 in | Wt 227.3 lb

## 2014-04-23 DIAGNOSIS — E119 Type 2 diabetes mellitus without complications: Secondary | ICD-10-CM | POA: Diagnosis not present

## 2014-04-23 DIAGNOSIS — I10 Essential (primary) hypertension: Secondary | ICD-10-CM | POA: Diagnosis not present

## 2014-04-23 DIAGNOSIS — Z9119 Patient's noncompliance with other medical treatment and regimen: Secondary | ICD-10-CM

## 2014-04-23 DIAGNOSIS — Z91199 Patient's noncompliance with other medical treatment and regimen due to unspecified reason: Secondary | ICD-10-CM

## 2014-04-23 DIAGNOSIS — Z713 Dietary counseling and surveillance: Secondary | ICD-10-CM | POA: Diagnosis present

## 2014-04-23 NOTE — Progress Notes (Signed)
Appt start time: 1630 end time:  1700.  Assessment:  Patient was seen on  04/23/14 for individual diabetes education follow up visit. He complains of his feet burning and hurting a lot especially in the morning time. Weigh gain of 6 pounds noted. He states he is not able to exercise due to foot pain. He tested his BG today at this visit with result of 391 mg/dl. States he did not take his Glipizide this AM. He talked about not wanting to leave his home, how angry he gets with traffic, and other interactions with people. 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  Current HbA1c: 10.1%  Preferred Learning Style:   Visual  Hands on  Learning Readiness:   Contemplating  MEDICATIONS: see list. Diabetes medication is now Glipizide and he has filled the Rx for Lisinopril too  DIETARY INTAKE:  24-hr recall:  B ( AM): coffee, crackers OR unsweetened cereal with whole milk, and sweetener Snk ( AM): occasionally candy  L ( PM): skips OR spaghetti OR chicken sandwich, water or regular soda Snk ( PM): same as AM D ( PM): spaghetti with meat sauce with added sugar, parmesan cheese, bread and garlic butter, soda or water, coffee Snk ( PM): not usually unless left overs  Beverages:regular soda or water, coffee  Usual physical activity: Activity is basically keeping up his yard and house work.  Estimated energy needs: 1500 calories 170 g carbohydrates 112 g protein 42 g fat  Progress Towards Goal(s):  In progress.   Nutritional Diagnosis:  NB-1.1 Food and nutrition-related knowledge deficit As related to diabetes .  As evidenced by A1c of 10.1%.    Intervention:  Nutrition counseling provided.   He was somewhat more anxious and talkative today, speaking mostly about foot pain he is having.  Reviewed carb counting based on food groups and reminded him of the goal of 4 Carb Choices (= 60 grams) per meal.   Discussed neuropathy as probable cause of burning in his feet and  that with better control of diabetes, that is the best way to improve the chances of preventing it from spreading and getting worse. Also explained the importance of taking his diabetes medications to control BG better.   Due to his short attention span, I plan to meet with him weekly for 30 minutes to cover one topic each time   Plan:  Aim for 4 Carb Choices per meal (60 grams) +/- 1 either way  Aim for 0-2 Carbs per snack if hungry  Include protein in moderation with your meals and snacks Consider checking BG every AM before your breakfast each day   Continue taking your diabetes and blood pressure medications as directed by MD   Ask your doctor about medicine for the tingling and burning in your feet. Go to see your counselor within the next week.  Teaching Method Utilized: Visual and Hands on  Handouts given during visit include: Foot Care handout Arm Chair Exercise handout  Barriers to learning/adherence to lifestyle change: extreme paranoia and anxiousness  Diabetes self-care support plan:   [facility] support group available  Demonstrated degree of understanding via:  Teach Back   Monitoring/Evaluation:  SMBG once a day every AM before food or coffee  in 1 -2 week(s).

## 2014-04-23 NOTE — Patient Instructions (Addendum)
Plan:  Aim for 4 Carb Choices per meal (60 grams) +/- 1 either way  Aim for 0-2 Carbs per snack if hungry  Include protein in moderation with your meals and snacks Consider checking BG every AM before your breakfast each day   Continue taking your diabetes and blood pressure medications as directed by MD Consider increasing your activity level with Arm Chair Exercises or walking at home as tolerated Ask your doctor about medicine for the tingling and burning in your feet.

## 2014-05-12 ENCOUNTER — Telehealth: Payer: Self-pay

## 2014-05-12 NOTE — Telephone Encounter (Signed)
PATIENT IS EXPECTING A CALL REGARDING DIABETIC SHOES. HE SAYS HE HAS WAITED A LONG TIME AND WANTS TO KNOW WHAT IS GOING ON.

## 2014-05-12 NOTE — Telephone Encounter (Signed)
According to Dr Audria NineMcPherson note, Podiatry- [facility]. You can call them to schedule a follow-up. I  Have discussed with him, he indicates the company who is working with him on shoes needs something from us. He does not know who they are or what they need. He will call me back and give me the name of the company and let me know what they need.

## 2014-05-12 NOTE — Telephone Encounter (Signed)
No record of diabetic shoes being ordered. Was this done during his exam with Dr. Audria NineMcPherson>?

## 2014-05-13 NOTE — Telephone Encounter (Signed)
I have spoken to [employer], and they have faxed over forms, medical necessity. Dr Audria NineMcPherson has completed these and they have been faxed, patient advised.

## 2014-05-19 ENCOUNTER — Ambulatory Visit: Payer: Medicare Other | Admitting: *Deleted

## 2014-06-03 ENCOUNTER — Telehealth: Payer: Self-pay

## 2014-06-03 NOTE — Telephone Encounter (Signed)
Patient called and states his diabetic shoes require an MD signature. States that company faxed us the forms for Dr. Audria NineMcPherson to sign several weeks ago but they have not received anything back. Advised patient that the forms have not been scanned into the computer system and that I would call the company to have them refax the form. Phoned the company and spoke with Amil AmenJulia [phone]) and she stated she faxed the form again to [phone]. Advised her that Dr. Audria NineMcPherson will be in tomorrow and would have the form to sign then. Called patient and informed him that the company has re-faxed the form to us and I will deliver it to her tomorrow to sign and fax back. Patient states he is very frustrated as this has been going on for weeks and how imperative it is that he have these shoes ASAP. Patient would like form signed and faxed tomorrow morning so that he can pick up the shoes tomorrow.

## 2014-06-04 NOTE — Telephone Encounter (Signed)
I completed these forms weeks ago but now the company is requiring additional documentation. I will complete forms and get the documentation sent back to company by Monday of next week. I appreciate Mr.Yearby' patience and understand his eagerness to have his shoes.

## 2014-06-04 NOTE — Telephone Encounter (Signed)
Pt called in requiring about his diabetic shoes, needs this form signed and faxed ASAP Pt is very frsutrated

## 2014-06-05 NOTE — Telephone Encounter (Signed)
Lm for pt- Company will have paperwork by Monday. Call us back if he has any further questions.

## 2014-06-09 NOTE — Telephone Encounter (Signed)
Form completed, faxed and should have been received by company.

## 2014-07-22 ENCOUNTER — Encounter: Payer: Medicare Other | Admitting: Family Medicine

## 2014-07-30 ENCOUNTER — Ambulatory Visit: Payer: Medicare Other | Admitting: Family Medicine

## 2014-08-06 ENCOUNTER — Ambulatory Visit: Payer: Medicare Other | Admitting: Family Medicine

## 2014-08-21 ENCOUNTER — Ambulatory Visit: Payer: Medicare Other | Admitting: Family Medicine

## 2014-09-01 ENCOUNTER — Ambulatory Visit: Payer: Medicare Other | Admitting: Family Medicine

## 2014-10-28 ENCOUNTER — Other Ambulatory Visit: Payer: Self-pay | Admitting: Family Medicine

## 2014-10-30 ENCOUNTER — Encounter: Payer: Medicare Other | Attending: Family Medicine | Admitting: *Deleted

## 2014-11-02 ENCOUNTER — Ambulatory Visit: Payer: Medicare Other | Admitting: *Deleted

## 2014-11-19 ENCOUNTER — Encounter: Payer: Medicare Other | Attending: Family Medicine | Admitting: *Deleted

## 2014-11-19 VITALS — Ht 71.0 in | Wt 226.1 lb

## 2014-11-19 DIAGNOSIS — Z713 Dietary counseling and surveillance: Secondary | ICD-10-CM | POA: Insufficient documentation

## 2014-11-19 DIAGNOSIS — Z9119 Patient's noncompliance with other medical treatment and regimen: Secondary | ICD-10-CM | POA: Insufficient documentation

## 2014-11-19 DIAGNOSIS — Z91199 Patient's noncompliance with other medical treatment and regimen due to unspecified reason: Secondary | ICD-10-CM

## 2014-11-19 DIAGNOSIS — E119 Type 2 diabetes mellitus without complications: Secondary | ICD-10-CM | POA: Diagnosis present

## 2014-11-19 NOTE — Progress Notes (Signed)
Appt start time: 1630 end time:  1700.  Assessment:  Patient was seen on  11/19/14 for individual diabetes education follow up visit. He has stopped checking his BG again. He also stopped taking his medications for about a week, stating he was feeling so bad, he thought he was going to die, and when he stopped his medications, he started to feel better. He continues to have anxiety about leaving his home and especially driving in traffic. He continues to complain of his feet burning and hurting a lot especially in the morning time. He brought his meter but isn't comfortable that he knows how to use it. We took his BG today to demonstrate use and his BG was 460 mg/dl.   Current HbA1c: 10.1%  Preferred Learning Style:   Visual  Hands on  Learning Readiness:   Contemplating  MEDICATIONS: see list. Diabetes medication is now Glipizide and he has filled the Rx for Lisinopril too  DIETARY INTAKE:  24-hr recall:  B ( AM): coffee, crackers OR unsweetened cereal with whole milk, and sweetener Snk ( AM): occasionally candy  L ( PM): skips OR spaghetti OR chicken sandwich, water or regular soda Snk ( PM): same as AM D ( PM): spaghetti with meat sauce with added sugar, parmesan cheese, bread and garlic butter, soda or water, coffee Snk ( PM): not usually unless left overs  Beverages:regular soda or water, coffee  Usual physical activity: Activity is basically keeping up his yard and house work.  Estimated energy needs: 1500 calories 170 g carbohydrates 112 g protein 42 g fat  Progress Towards Goal(s):  In progress.   Nutritional Diagnosis:  NB-1.1 Food and nutrition-related knowledge deficit As related to diabetes .  As evidenced by A1c of 10.1%.    Intervention:  Nutrition counseling continued.   Discussed physiology of Diabetes and strongly encouraged him to take his Diabetes medications as prescribed, repeatedly  Reviewed use of his Accu Chek meter and Fast Clix lancing  device  Reviewed carb counting based on food groups and reminded him of the goal of 4 Carb Choices (= 60 grams) per meal.   Discussed neuropathy as probable cause of burning in his feet and that with better control of diabetes, that is the best way to improve the chances of preventing it from spreading and getting worse.    Plan:  Consider checking BG every AM before your breakfast each day   Continue taking your diabetes pill before breakfast and supper and blood pressure medications as directed by MD Consider increasing your activity level with Arm Chair Exercises or walking at home as tolerated Ask your doctor about medicine for the tingling and burning in your feet.      Teaching Method Utilized: Visual and Hands on  Handouts given during visit include: Yellow Meal Plan card  Barriers to learning/adherence to lifestyle change: extreme paranoia and anxiousness  Diabetes self-care support plan:   [facility] support group available  Demonstrated degree of understanding via:  Teach Back   Monitoring/Evaluation:  SMBG once a day every AM before food or coffee  in 1 month.

## 2014-11-19 NOTE — Patient Instructions (Signed)
Plan:  Consider checking BG every AM before your breakfast each day   Continue taking your diabetes pill before breakfast and supper and blood pressure medications as directed by MD Consider increasing your activity level with Arm Chair Exercises or walking at home as tolerated Ask your doctor about medicine for the tingling and burning in your feet.

## 2014-11-20 ENCOUNTER — Other Ambulatory Visit: Payer: Self-pay | Admitting: Family Medicine

## 2014-11-25 ENCOUNTER — Telehealth: Payer: Self-pay

## 2014-11-25 NOTE — Telephone Encounter (Signed)
Pt states that he really is in need of his generic Prilosec, he is aware that he does need an OV and has scheduled an appt for 12/04/14 with Dr. Audria NineMcpherson.  Best# [phone]

## 2014-11-26 MED ORDER — OMEPRAZOLE 40 MG PO CPDR: DELAYED_RELEASE_CAPSULE | ORAL | Status: DC

## 2014-11-26 NOTE — Telephone Encounter (Signed)
Refilled

## 2014-11-28 ENCOUNTER — Other Ambulatory Visit: Payer: Self-pay | Admitting: Family Medicine

## 2014-12-01 ENCOUNTER — Other Ambulatory Visit: Payer: Self-pay | Admitting: Family Medicine

## 2014-12-01 LAB — HM DIABETES EYE EXAM

## 2014-12-04 ENCOUNTER — Ambulatory Visit: Payer: Medicare Other | Admitting: Family Medicine

## 2014-12-10 ENCOUNTER — Ambulatory Visit: Payer: Medicare Other | Admitting: *Deleted

## 2014-12-11 ENCOUNTER — Ambulatory Visit: Payer: Medicare Other | Admitting: Family Medicine

## 2014-12-16 ENCOUNTER — Ambulatory Visit: Payer: Medicare Other | Admitting: Family Medicine

## 2014-12-25 ENCOUNTER — Ambulatory Visit: Payer: Medicare Other | Admitting: Family Medicine

## 2014-12-31 ENCOUNTER — Ambulatory Visit: Payer: Medicare Other | Admitting: Family Medicine

## 2014-12-31 ENCOUNTER — Ambulatory Visit: Payer: Medicare Other | Admitting: *Deleted

## 2015-01-06 ENCOUNTER — Encounter: Payer: Self-pay | Admitting: Family Medicine

## 2015-01-20 ENCOUNTER — Other Ambulatory Visit: Payer: Self-pay | Admitting: Family Medicine

## 2015-01-21 ENCOUNTER — Ambulatory Visit: Payer: Medicare Other | Admitting: *Deleted

## 2015-02-11 ENCOUNTER — Ambulatory Visit: Payer: Medicare Other | Admitting: *Deleted

## 2015-02-19 ENCOUNTER — Other Ambulatory Visit: Payer: Self-pay | Admitting: Family Medicine

## 2015-03-23 ENCOUNTER — Other Ambulatory Visit: Payer: Self-pay | Admitting: Physician Assistant

## 2015-04-08 ENCOUNTER — Other Ambulatory Visit: Payer: Self-pay | Admitting: Family Medicine

## 2015-04-20 ENCOUNTER — Other Ambulatory Visit: Payer: Self-pay | Admitting: Family Medicine

## 2015-04-22 ENCOUNTER — Encounter: Payer: Self-pay | Admitting: Urgent Care

## 2015-04-22 ENCOUNTER — Ambulatory Visit (INDEPENDENT_AMBULATORY_CARE_PROVIDER_SITE_OTHER): Payer: Medicare Other | Admitting: Urgent Care

## 2015-04-22 VITALS — BP 130/86 | HR 90 | Temp 98.0°F | Resp 16 | Ht 72.0 in | Wt 213.8 lb

## 2015-04-22 DIAGNOSIS — E1165 Type 2 diabetes mellitus with hyperglycemia: Secondary | ICD-10-CM

## 2015-04-22 DIAGNOSIS — IMO0002 Reserved for concepts with insufficient information to code with codable children: Secondary | ICD-10-CM

## 2015-04-22 DIAGNOSIS — F411 Generalized anxiety disorder: Secondary | ICD-10-CM | POA: Diagnosis not present

## 2015-04-22 DIAGNOSIS — I10 Essential (primary) hypertension: Secondary | ICD-10-CM | POA: Diagnosis not present

## 2015-04-22 DIAGNOSIS — E559 Vitamin D deficiency, unspecified: Secondary | ICD-10-CM | POA: Diagnosis not present

## 2015-04-22 DIAGNOSIS — K219 Gastro-esophageal reflux disease without esophagitis: Secondary | ICD-10-CM

## 2015-04-22 DIAGNOSIS — R0789 Other chest pain: Secondary | ICD-10-CM | POA: Diagnosis not present

## 2015-04-22 DIAGNOSIS — L719 Rosacea, unspecified: Secondary | ICD-10-CM | POA: Diagnosis not present

## 2015-04-22 DIAGNOSIS — F909 Attention-deficit hyperactivity disorder, unspecified type: Secondary | ICD-10-CM | POA: Diagnosis not present

## 2015-04-22 DIAGNOSIS — F988 Other specified behavioral and emotional disorders with onset usually occurring in childhood and adolescence: Secondary | ICD-10-CM

## 2015-04-22 DIAGNOSIS — E785 Hyperlipidemia, unspecified: Secondary | ICD-10-CM | POA: Diagnosis not present

## 2015-04-22 MED ORDER — ALPRAZOLAM 0.5 MG PO TABS: 0.2500 mg | ORAL_TABLET | Freq: Every evening | ORAL | Status: DC | PRN

## 2015-04-22 MED ORDER — LISINOPRIL 5 MG PO TABS: 5.0000 mg | ORAL_TABLET | Freq: Every day | ORAL | Status: DC

## 2015-04-22 MED ORDER — GLIPIZIDE 5 MG PO TABS: 5.0000 mg | ORAL_TABLET | Freq: Two times a day (BID) | ORAL | Status: DC

## 2015-04-22 MED ORDER — ASPIRIN 81 MG PO TABS: 81.0000 mg | ORAL_TABLET | Freq: Every day | ORAL | Status: AC

## 2015-04-22 MED ORDER — VITAMIN D (ERGOCALCIFEROL) 1.25 MG (50000 UNIT) PO CAPS: 50000.0000 [IU] | ORAL_CAPSULE | ORAL | Status: DC

## 2015-04-22 MED ORDER — AMPHETAMINE-DEXTROAMPHETAMINE 15 MG PO TABS: 7.5000 mg | ORAL_TABLET | Freq: Every day | ORAL | Status: DC

## 2015-04-22 MED ORDER — METOPROLOL TARTRATE 25 MG PO TABS: 25.0000 mg | ORAL_TABLET | Freq: Every day | ORAL | Status: DC

## 2015-04-22 MED ORDER — OMEPRAZOLE 40 MG PO CPDR: DELAYED_RELEASE_CAPSULE | ORAL | Status: DC

## 2015-04-22 NOTE — Progress Notes (Signed)
MRN: [id number] DOB: 1960/11/20  Subjective:   [name] is a 54 y.o. male presenting for chief complaint of Medication Refill and Depression  Diabetes - patient was diagnosed ~1 year ago, states that he is upset with his follow up, has not been seen since "he almost died after starting Metformin". He is also upset that he ran out of his medications. Has not taken them for a couple of weeks. Currently, he is being managed with glipizide only. His blood sugars range from 150's-250's fasting. Patient's diet is non-compliant, although he is trying to work with a nutritionist on this. He is limited with exercise and is on disability. He currently admits intermittent transient mid-sternal chest pain, lasting a few seconds and is non-radiating. Denies diaphoresis, arm pain, neck pain, jaw pain, n/v, abdominal pain. This chest pain has occurred just a few times over the past several months. He has not been seen by cardiology but has had previous EKGs completed. He also admits burning sensation in his feet bilaterally for months now. Denies polyuria, polydipsia. Of note, patient is unwilling to have blood drawn today. He is very anxious and upset that he feels like he's been managing diabetes on his own.   HTN - currently managed with met and lisinopril. Reports compliance with these medications. Diet and exercise as above. ROS as above.  Anxiety - managed with Xanax alone. Patient continues to have difficulty with worrying excessively, has a short fuse, becomes irritable easily. He has not previously tried an antianxiety medication. Does not have psychiatrist but was previously working with therapist. This is no longer the case.  ADD - managed with Adderall. Feels like he needs this to focus. Uses 1 dose twice daily. Mental health as above.  GERD - managed with omeprazole. Has symptoms weekly, more frequently lately since he ran out of medication.  Vitamin D - has a history of Vitamin D deficiency.  Takes supplement for this but has not had follow up blood work.  Denies any other aggravating or relieving factors, no other questions or concerns.  Symon has a current medication list which includes the following prescription(s): alprazolam, amphetamine-dextroamphetamine, glipizide, glucose blood, lancets, lisinopril, metoprolol tartrate, omeprazole, vitamin d (ergocalciferol), and aspirin. He has No Known Allergies.  Devontre  has a past medical history of Diabetes mellitus; Anxiety; and Arthritis. Also  has past surgical history that includes Varicose vein surgery.  ROS As in subjective.  Objective:   Vitals: BP 130/86 mmHg  Pulse 90  Temp(Src) 98 F (36.7 C) (Oral)  Resp 16  Ht 6' (1.829 m)  Wt 213 lb 12.8 oz (96.979 kg)  BMI 28.99 kg/m2  SpO2 97%  Physical Exam  Constitutional: He is oriented to person, place, and time. He appears well-developed and well-nourished.  HENT:  Mouth/Throat: Oropharynx is clear and moist.  Eyes: Conjunctivae are normal. Pupils are equal, round, and reactive to light. Right eye exhibits no discharge. Left eye exhibits no discharge. No scleral icterus.  Neck: Normal range of motion. Neck supple. No thyromegaly present.  Cardiovascular: Normal rate, regular rhythm and intact distal pulses.  Exam reveals no gallop and no friction rub.   No murmur heard. Pulmonary/Chest: No respiratory distress. He has no wheezes. He has no rales. He exhibits no tenderness (no reproducible chest pain).  Abdominal: Soft. Bowel sounds are normal. He exhibits no distension and no mass. There is no tenderness.  Musculoskeletal: He exhibits no edema or tenderness.  Neurological: He is alert and oriented to  person, place, and time.  Skin: Skin is warm and dry. Rash (Patches of erythema throughout face, reports history of rosecea and would like a referral to dermatology) noted. No erythema. No pallor.  Psychiatric: His mood appears anxious. His affect is not angry, not blunt,  not labile and not inappropriate. His speech is not rapid and/or pressured and not tangential. He is agitated. He is not aggressive, not hyperactive, not slowed, not withdrawn and not combative.   ECG interpretation by Dr. Everlene Farrier and PA-Stacie Knutzen: sinus rhythm.  Assessment and Plan :   A total of 70 minutes of face to face time was spent with this patient, 60 minutes were spent discussing multiple diagnoses, multiple controlled substance medications including review of adverse effects and developing plan for treatment and follow up.  1. Diabetes type 2, uncontrolled - Uncontrolled, patient refused labs today, I provided patient with medication refill of glipizide and stressed to patient that he needs better control of diabetes with 1-2 additional medications. Patient was very irritable, does not want to try Metformin again, will consider other medications after he returns for labs. - It was unclear why patient refused labs but patient agreed to return in 1 week to have blood drawn. I negotiated with patient to provide short term refill until labs are drawn so that we can adequately manage all of his diagnoses. - Follow up in 2 weeks.  2. Generalized anxiety disorder - Uncontrolled, discussed appropriate medical therapy including maintenance medication (SSRI) and as needed use of Xanax. Plan to wean off of Adderall given atypical chest pain, anxiety and HTN. Patient was very irritated by this idea but I negotiated with the patient to refill his Adderall for 1 month only so that he could start to wean himself off of this. Patient verbalized understanding but is not necessarily agreeing.  3. Atypical chest pain - PE findings and ecg reassuring, may be component of anxiety. Advised that he use baby aspirin daily. - Will refer to cardiology for heart disease screen.  4. Vitamin D deficiency - Refilled Vitamin D supplement  5. Essential hypertension - Controlled, continue metoprolol, lisinopril - Recheck  in 6 months  6. Gastroesophageal reflux disease, esophagitis presence not specified - Stable, restart Prilosec, refill provided  7. ADD (attention deficit disorder) - Provided temporary refill of Adderall, made my intentions regarding management of this diagnosis/medication very clear. Will follow up with patient in 2-4 weeks with plan to discontinue this medicine as above.  8. Rosacea - Referral to dermatology pending.  Jaynee Eagles, PA-C [facility] [phone] 04/22/2015 10:02 PM

## 2015-04-22 NOTE — Patient Instructions (Addendum)
Diabetes and Exercise Exercising regularly is important. It is not just about losing weight. It has many health benefits, such as:  Improving your overall fitness, flexibility, and endurance.  Increasing your bone density.  Helping with weight control.  Decreasing your body fat.  Increasing your muscle strength.  Reducing stress and tension.  Improving your overall health. People with diabetes who exercise gain additional benefits because exercise:  Reduces appetite.  Improves the body's use of blood sugar (glucose).  Helps lower or control blood glucose.  Decreases blood pressure.  Helps control blood lipids (such as cholesterol and triglycerides).  Improves the body's use of the hormone insulin by:  Increasing the body's insulin sensitivity.  Reducing the body's insulin needs.  Decreases the risk for heart disease because exercising:  Lowers cholesterol and triglycerides levels.  Increases the levels of good cholesterol (such as high-density lipoproteins [HDL]) in the body.  Lowers blood glucose levels. YOUR ACTIVITY PLAN  Choose an activity that you enjoy and set realistic goals. Your health care provider or diabetes educator can help you make an activity plan that works for you. Exercise regularly as directed by your health care provider. This includes:  Performing resistance training twice a week such as push-ups, sit-ups, lifting weights, or using resistance bands.  Performing 150 minutes of cardio exercises each week such as walking, running, or playing sports.  Staying active and spending no more than 90 minutes at one time being inactive. Even short bursts of exercise are good for you. Three 10-minute sessions spread throughout the day are just as beneficial as a single 30-minute session. Some exercise ideas include:  Taking the dog for a walk.  Taking the stairs instead of the elevator.  Dancing to your favorite song.  Doing an exercise  video.  Doing your favorite exercise with a friend. RECOMMENDATIONS FOR EXERCISING WITH TYPE 1 OR TYPE 2 DIABETES   Check your blood glucose before exercising. If blood glucose levels are greater than 240 mg/dL, check for urine ketones. Do not exercise if ketones are present.  Avoid injecting insulin into areas of the body that are going to be exercised. For example, avoid injecting insulin into:  The arms when playing tennis.  The legs when jogging.  Keep a record of:  Food intake before and after you exercise.  Expected peak times of insulin action.  Blood glucose levels before and after you exercise.  The type and amount of exercise you have done.  Review your records with your health care provider. Your health care provider will help you to develop guidelines for adjusting food intake and insulin amounts before and after exercising.  If you take insulin or oral hypoglycemic agents, watch for signs and symptoms of hypoglycemia. They include:  Dizziness.  Shaking.  Sweating.  Chills.  Confusion.  Drink plenty of water while you exercise to prevent dehydration or heat stroke. Body water is lost during exercise and must be replaced.  Talk to your health care provider before starting an exercise program to make sure it is safe for you. Remember, almost any type of activity is better than none. Document Released: 01/20/2004 Document Revised: 03/16/2014 Document Reviewed: 04/08/2013 [facility] Patient Information 2015 [place], [place]. This information is not intended to replace advice given to you by your health care provider. Make sure you discuss any questions you have with your health care provider.   Generalized Anxiety Disorder Generalized anxiety disorder (GAD) is a mental disorder. It interferes with life functions, including relationships, work,  and school. GAD is different from normal anxiety, which everyone experiences at some point in their lives in response to  specific life events and activities. Normal anxiety actually helps [place] prepare for and get through these life events and activities. Normal anxiety goes away after the event or activity is over.  GAD causes anxiety that is not necessarily related to specific events or activities. It also causes excess anxiety in proportion to specific events or activities. The anxiety associated with GAD is also difficult to control. GAD can vary from mild to severe. People with severe GAD can have intense waves of anxiety with physical symptoms (panic attacks).  SYMPTOMS The anxiety and worry associated with GAD are difficult to control. This anxiety and worry are related to many life events and activities and also occur more days than not for 6 months or longer. People with GAD also have three or more of the following symptoms (one or more in children):  Restlessness.   Fatigue.  Difficulty concentrating.   Irritability.  Muscle tension.  Difficulty sleeping or unsatisfying sleep. DIAGNOSIS GAD is diagnosed through an assessment by your health care provider. Your health care provider will ask you questions aboutyour mood,physical symptoms, and events in your life. Your health care provider may ask you about your medical history and use of alcohol or drugs, including prescription medicines. Your health care provider may also do a physical exam and blood tests. Certain medical conditions and the use of certain substances can cause symptoms similar to those associated with GAD. Your health care provider may refer you to a mental health specialist for further evaluation. TREATMENT The following therapies are usually used to treat GAD:   Medication. Antidepressant medication usually is prescribed for long-term daily control. Antianxiety medicines may be added in severe cases, especially when panic attacks occur.   Talk therapy (psychotherapy). Certain types of talk therapy can be helpful in treating GAD by  providing support, education, and guidance. A form of talk therapy called cognitive behavioral therapy can teach you healthy ways to think about and react to daily life events and activities.  Stress managementtechniques. These include yoga, meditation, and exercise and can be very helpful when they are practiced regularly. A mental health specialist can help determine which treatment is best for you. Some people see improvement with one therapy. However, other people require a combination of therapies. Document Released: 02/24/2013 Document Revised: 03/16/2014 Document Reviewed: 02/24/2013 [facility] Patient Information 2015 [place], [place]. This information is not intended to replace advice given to you by your health care provider. Make sure you discuss any questions you have with your health care provider.   Hypertension Hypertension, commonly called high blood pressure, is when the force of blood pumping through your arteries is too strong. Your arteries are the blood vessels that carry blood from your heart throughout your body. A blood pressure reading consists of a higher number over a lower number, such as 110/72. The higher number (systolic) is the pressure inside your arteries when your heart pumps. The lower number (diastolic) is the pressure inside your arteries when your heart relaxes. Ideally you want your blood pressure below 120/80. Hypertension forces your heart to work harder to pump blood. Your arteries may become narrow or stiff. Having hypertension puts you at risk for heart disease, stroke, and other problems.  RISK FACTORS Some risk factors for high blood pressure are controllable. Others are not.  Risk factors you cannot control include:   Race. You may be at  higher risk if you are African American.  Age. Risk increases with age.  Gender. Men are at higher risk than women before age 39 years. After age 25, women are at higher risk than men. Risk factors you can control  include:  Not getting enough exercise or physical activity.  Being overweight.  Getting too much fat, sugar, calories, or salt in your diet.  Drinking too much alcohol. SIGNS AND SYMPTOMS Hypertension does not usually cause signs or symptoms. Extremely high blood pressure (hypertensive crisis) may cause headache, anxiety, shortness of breath, and nosebleed. DIAGNOSIS  To check if you have hypertension, your health care provider will measure your blood pressure while you are seated, with your arm held at the level of your heart. It should be measured at least twice using the same arm. Certain conditions can cause a difference in blood pressure between your right and left arms. A blood pressure reading that is higher than normal on one occasion does not mean that you need treatment. If one blood pressure reading is high, ask your health care provider about having it checked again. TREATMENT  Treating high blood pressure includes making lifestyle changes and possibly taking medicine. Living a healthy lifestyle can help lower high blood pressure. You may need to change some of your habits. Lifestyle changes may include:  Following the DASH diet. This diet is high in fruits, vegetables, and whole grains. It is low in salt, red meat, and added sugars.  Getting at least 2 hours of brisk physical activity every week.  Losing weight if necessary.  Not smoking.  Limiting alcoholic beverages.  Learning ways to reduce stress. If lifestyle changes are not enough to get your blood pressure under control, your health care provider may prescribe medicine. You may need to take more than one. Work closely with your health care provider to understand the risks and benefits. HOME CARE INSTRUCTIONS  Have your blood pressure rechecked as directed by your health care provider.   Take medicines only as directed by your health care provider. Follow the directions carefully. Blood pressure medicines must  be taken as prescribed. The medicine does not work as well when you skip doses. Skipping doses also puts you at risk for problems.   Do not smoke.   Monitor your blood pressure at home as directed by your health care provider. SEEK MEDICAL CARE IF:   You think you are having a reaction to medicines taken.  You have recurrent headaches or feel dizzy.  You have swelling in your ankles.  You have trouble with your vision. SEEK IMMEDIATE MEDICAL CARE IF:  You develop a severe headache or confusion.  You have unusual weakness, numbness, or feel faint.  You have severe chest or abdominal pain.  You vomit repeatedly.  You have trouble breathing. MAKE SURE YOU:   Understand these instructions.  Will watch your condition.  Will get help right away if you are not doing well or get worse. Document Released: 10/30/2005 Document Revised: 03/16/2014 Document Reviewed: 08/22/2013 [facility] Patient Information 2015 [place], [place]. This information is not intended to replace advice given to you by your health care provider. Make sure you discuss any questions you have with your health care provider.

## 2015-04-23 DIAGNOSIS — IMO0002 Reserved for concepts with insufficient information to code with codable children: Secondary | ICD-10-CM | POA: Insufficient documentation

## 2015-04-23 DIAGNOSIS — F411 Generalized anxiety disorder: Secondary | ICD-10-CM | POA: Insufficient documentation

## 2015-04-23 DIAGNOSIS — R0789 Other chest pain: Secondary | ICD-10-CM | POA: Insufficient documentation

## 2015-04-23 DIAGNOSIS — E1165 Type 2 diabetes mellitus with hyperglycemia: Secondary | ICD-10-CM | POA: Insufficient documentation

## 2015-04-23 DIAGNOSIS — E559 Vitamin D deficiency, unspecified: Secondary | ICD-10-CM | POA: Insufficient documentation

## 2015-06-17 ENCOUNTER — Encounter: Payer: Self-pay | Admitting: *Deleted

## 2015-07-01 ENCOUNTER — Encounter: Payer: Medicare Other | Attending: Family Medicine | Admitting: *Deleted

## 2015-07-01 ENCOUNTER — Encounter: Payer: Self-pay | Admitting: *Deleted

## 2015-07-01 VITALS — Ht 71.0 in | Wt 210.0 lb

## 2015-07-01 DIAGNOSIS — Z713 Dietary counseling and surveillance: Secondary | ICD-10-CM | POA: Insufficient documentation

## 2015-07-01 DIAGNOSIS — IMO0002 Reserved for concepts with insufficient information to code with codable children: Secondary | ICD-10-CM

## 2015-07-01 DIAGNOSIS — E1165 Type 2 diabetes mellitus with hyperglycemia: Secondary | ICD-10-CM | POA: Insufficient documentation

## 2015-07-01 NOTE — Progress Notes (Signed)
Appt start time: 1630 end time:  1700.  Assessment:  Patient was seen on  07/01/15 for individual diabetes education follow up visit. His last visit was on 11/19/14. He states his last PCP retired and he met with another MD last month. He states it was not a good visit due to an extended wait time and then not having adequate time to get to know the new MD. So he has not returned for the lab work or follow up appointment yet. He again has stopped taking his Diabetes medicine due to a feeling of light headedness. He brought his meter today but states he has not been using it and needs further instruction on how to use it. We took his BG today to demonstrate use and his BG was 360 mg/dl.  He continues to complain of his feet burning and hurting a lot especially in the morning time. He brought his meter but isn't comfortable that he knows how to use it. He also discussed that he gets great pleasure from feeding the birds and small animals and this progressed into the idea of him adopting a small dog that would be company for him and give him something to be responsible for.  Current HbA1c: 10.1%,  Preferred Learning Style:   Visual  Hands on  Learning Readiness:   Contemplating  MEDICATIONS: see list. Diabetes medication is now Glipizide and he has filled the Rx for Lisinopril too  DIETARY INTAKE:  24-hr recall:  B ( AM): coffee, crackers OR unsweetened cereal with whole milk, and sweetener Snk ( AM): occasionally candy  L ( PM): skips OR spaghetti OR chicken sandwich, water or regular soda Snk ( PM): same as AM D ( PM): spaghetti with meat sauce with added sugar, parmesan cheese, bread and garlic butter, soda or water, coffee Snk ( PM): not usually unless left overs  Beverages:regular soda or water, coffee  Usual physical activity: Activity is basically keeping up his yard and house work.  Estimated energy needs: 1500 calories 170 g carbohydrates 112 g protein 42 g fat  Progress  Towards Goal(s):  In progress.   Nutritional Diagnosis:  NB-1.1 Food and nutrition-related knowledge deficit As related to diabetes .  As evidenced by A1c of 10.1%.    Intervention:  Nutrition counseling continued.   Discussed relationship of his medicaions to BG management and the importance of taking them   Reviewed use of his Accu Chek meter and Fast Clix lancing device  Reviewed causes and symptoms of hypoglycemia, and treatment options.  Discussed neuropathy as probable cause of burning in his feet and that with better control of diabetes, that is the best way to improve the chances of preventing it from spreading and getting worse.    Plan:  Call the MD office to communicate your concerns and see about what your options are for follow up. Consider checking BG before your breakfast and supper each day   Continue taking your diabetes pill before breakfast and supper and blood pressure medications as directed by MD If you feel shakey or unsteady, check your BG to see if that feeling is a low BG or something else If your BG is below 100, drink 4 oz OJ to bring it back up again I hope you find a great little dog to come and live with you.   Teaching Method Utilized: Visual and Hands on  Handouts given during visit include: No handouts today  Barriers to learning/adherence to lifestyle change: extreme paranoia and  anxiousness  Diabetes self-care support plan:   [facility] support group available  Demonstrated degree of understanding via:  Teach Back   Monitoring/Evaluation:  Follow up  in 1 month.

## 2015-07-01 NOTE — Patient Instructions (Signed)
Plan:  Call the MD office to communicate your concerns and see about what your options are for follow up. Consider checking BG before your breakfast and supper each day   Continue taking your diabetes pill before breakfast and supper and blood pressure medications as directed by MD If you feel shakey or unsteady, check your BG to see if that feeling is a low BG or something else If your BG is below 100, drink 4 oz OJ to bring it back up again I hope you find a great little dog to come and live with you.

## 2015-07-30 ENCOUNTER — Other Ambulatory Visit: Payer: Self-pay | Admitting: Urgent Care

## 2015-08-05 ENCOUNTER — Ambulatory Visit: Payer: Medicare Other | Admitting: *Deleted

## 2015-08-25 ENCOUNTER — Other Ambulatory Visit: Payer: Self-pay | Admitting: Physician Assistant

## 2015-09-06 ENCOUNTER — Telehealth: Payer: Self-pay | Admitting: Family Medicine

## 2015-09-06 NOTE — Telephone Encounter (Signed)
[facility] and tobacco screening per thn.

## 2015-09-16 ENCOUNTER — Encounter: Payer: Medicare Other | Attending: Family Medicine | Admitting: *Deleted

## 2015-09-16 VITALS — Ht 71.0 in | Wt 216.0 lb

## 2015-09-16 DIAGNOSIS — IMO0002 Reserved for concepts with insufficient information to code with codable children: Secondary | ICD-10-CM

## 2015-09-16 DIAGNOSIS — Z713 Dietary counseling and surveillance: Secondary | ICD-10-CM | POA: Insufficient documentation

## 2015-09-16 DIAGNOSIS — E1165 Type 2 diabetes mellitus with hyperglycemia: Secondary | ICD-10-CM | POA: Insufficient documentation

## 2015-09-16 DIAGNOSIS — E1142 Type 2 diabetes mellitus with diabetic polyneuropathy: Secondary | ICD-10-CM

## 2015-09-16 NOTE — Progress Notes (Signed)
Appt start time: 1615 end time:  1715.  Assessment:  Patient was seen on  09/16/15 for individual diabetes education follow up visit. He has not followed up with his MD office, states he will call tomorrow. He states he only checks BG intermittently, and they continue to be in the 300-400 mg/dl range. He continues to complain of his feet burning and hurting a lot especially in the morning time. He also states he has poor balance when he first gets up in the AM.  Current HbA1c: 10.1%,  Preferred Learning Style:   Visual  Hands on  Learning Readiness:   Contemplating  MEDICATIONS: see list. Diabetes medication is now Glipizide and he has filled the Rx for Lisinopril too  DIETARY INTAKE:  24-hr recall:  B ( AM): coffee, crackers OR unsweetened cereal with whole milk, and sweetener Snk ( AM): occasionally candy  L ( PM): skips OR spaghetti OR chicken sandwich, water or regular soda Snk ( PM): same as AM D ( PM): spaghetti with meat sauce with added sugar, parmesan cheese, bread and garlic butter, soda or water, coffee Snk ( PM): not usually unless left overs  Beverages:regular soda or water, coffee  Usual physical activity: Activity is basically keeping up his yard and house work.  Estimated energy needs: 1500 calories 170 g carbohydrates 112 g protein 42 g fat  Progress Towards Goal(s):  In progress.   Nutritional Diagnosis:  NB-1.1 Food and nutrition-related knowledge deficit As related to diabetes .  As evidenced by A1c of 10.1%.    Intervention:  Nutrition counseling continued.   Again encouraged him to take his diabetes medication and to call his MD office for follow up visit immediately  Encouraged him to check his BG at least twice a day  Reinforced that his neuropathy is probable cause of burning in his feet and that with better control of diabetes, that is the best way to improve the chances of preventing it from spreading and getting worse.    Plan:  Call the  MD office to make an appointment and see about what your options are for follow up. Consider checking BG before your breakfast and supper each day   Continue taking your diabetes pill before breakfast and supper and blood pressure medications as directed by MD If you feel shakey or unsteady, check your BG to see if that feeling is a low BG or something else If your BG is below 100, drink 4 oz OJ to bring it back up again   Teaching Method Utilized: Visual and Hands on  Handouts given during visit include: No handouts today  Barriers to learning/adherence to lifestyle change: extreme paranoia and anxiousness  Diabetes self-care support plan:   [facility] support [facility] available  Demonstrated degree of understanding via:  Teach Back   Monitoring/Evaluation:  Follow up  in 1 month.

## 2015-09-20 ENCOUNTER — Ambulatory Visit (INDEPENDENT_AMBULATORY_CARE_PROVIDER_SITE_OTHER): Payer: Medicare Other | Admitting: Physician Assistant

## 2015-09-20 ENCOUNTER — Encounter: Payer: Self-pay | Admitting: Physician Assistant

## 2015-09-20 VITALS — BP 138/88 | HR 102 | Temp 97.8°F | Resp 16 | Wt 218.0 lb

## 2015-09-20 DIAGNOSIS — I10 Essential (primary) hypertension: Secondary | ICD-10-CM

## 2015-09-20 DIAGNOSIS — F411 Generalized anxiety disorder: Secondary | ICD-10-CM

## 2015-09-20 DIAGNOSIS — R7309 Other abnormal glucose: Secondary | ICD-10-CM

## 2015-09-20 DIAGNOSIS — F909 Attention-deficit hyperactivity disorder, unspecified type: Secondary | ICD-10-CM | POA: Diagnosis not present

## 2015-09-20 DIAGNOSIS — R079 Chest pain, unspecified: Secondary | ICD-10-CM | POA: Diagnosis not present

## 2015-09-20 DIAGNOSIS — F988 Other specified behavioral and emotional disorders with onset usually occurring in childhood and adolescence: Secondary | ICD-10-CM

## 2015-09-20 DIAGNOSIS — E1165 Type 2 diabetes mellitus with hyperglycemia: Secondary | ICD-10-CM

## 2015-09-20 DIAGNOSIS — E785 Hyperlipidemia, unspecified: Secondary | ICD-10-CM | POA: Diagnosis not present

## 2015-09-20 MED ORDER — ALPRAZOLAM 0.5 MG PO TABS: 0.2500 mg | ORAL_TABLET | Freq: Every evening | ORAL | Status: DC | PRN

## 2015-09-20 MED ORDER — GLIPIZIDE 5 MG PO TABS: 5.0000 mg | ORAL_TABLET | Freq: Two times a day (BID) | ORAL | Status: DC

## 2015-09-20 MED ORDER — AMPHETAMINE-DEXTROAMPHETAMINE 15 MG PO TABS: 7.5000 mg | ORAL_TABLET | Freq: Every day | ORAL | Status: DC

## 2015-09-20 MED ORDER — METOPROLOL TARTRATE 25 MG PO TABS: 25.0000 mg | ORAL_TABLET | Freq: Every day | ORAL | Status: DC

## 2015-09-20 MED ORDER — LISINOPRIL 5 MG PO TABS: 5.0000 mg | ORAL_TABLET | Freq: Every day | ORAL | Status: DC

## 2015-09-20 NOTE — Patient Instructions (Signed)
You can continue the glipizide.  I would like you to take the metoprolol at this time.  I would also like you to take a baby aspirin. I am sending you to cardiology specialist--please await that appointment.   You can keep to your standard activities. I will contact you with the result of your blood work.   Angina Pectoris Angina pectoris is a very bad feeling in the chest, neck, or arm. Your doctor may call it angina. There are four types of angina. Angina is caused by a lack of blood in the middle and thickest layer of the heart wall (myocardium). Angina may feel like a crushing or squeezing pain in the chest. It may feel like tightness or heavy pressure in the chest. Some people say it feels like gas, heartburn, or indigestion. Some people have symptoms other than pain. These include:  Shortness of breath.  Cold sweats.  Feeling sick to your stomach (nausea).  Feeling light-headed. Many women have chest discomfort and some of the other symptoms. However, women often have different symptoms, such as:  Feeling tired (fatigue).  Feeling nervous for no reason.  Feeling weak for no reason.  Dizziness or fainting. Women may have angina without any symptoms. HOME CARE  Take medicines only as told by your doctor.  Take care of other health issues as told by your doctor. These include:  High blood pressure (hypertension).  Diabetes.  Follow a heart-healthy diet. Your doctor can help you to choose healthy food options and make changes.  Talk to your doctor to learn more about healthy cooking methods and use them. These include:  Roasting.  Grilling.  Broiling.  Baking.  Poaching.  Steaming.  Stir-frying.  Follow an exercise program approved by your doctor.  Keep a healthy weight. Lose weight as told by your doctor.  Rest when you are tired.  Learn to manage stress.  Do not use any tobacco, such as cigarettes, chewing tobacco, or electronic cigarettes. If you need  help quitting, ask your doctor.  If you drink alcohol, and your doctor says it is okay, limit yourself to no more than 1 drink per day. One drink equals 12 ounces of beer, 5 ounces of wine, or 1 ounces of hard liquor.  Stop illegal drug use.  Keep all follow-up visits as told by your doctor. This is important. Do not take these medicines unless your doctor says that you can:  Nonsteroidal anti-inflammatory drugs (NSAIDs). These include:  Ibuprofen.  Naproxen.  Celecoxib.  Vitamin supplements that have vitamin A, vitamin E, or both.  Hormone therapy that contains estrogen with or without progestin. GET HELP RIGHT AWAY IF:  You have pain in your chest, neck, arm, jaw, stomach, or back that:  Lasts more than a few minutes.  Comes back.  Does not get better after you take medicine under your tongue (sublingual nitroglycerin).  You have any of these symptoms for no reason:  Gas, heartburn, or indigestion.  Sweating a lot.  Shortness of breath or trouble breathing.  Feeling sick to your stomach or throwing up.  Feeling more tired than usual.  Feeling nervous or worrying more than usual.  Feeling weak.  Diarrhea.  You are suddenly dizzy or light-headed.  You faint or pass out. These symptoms may be an emergency. Do not wait to see if the symptoms will go away. Get medical help right away. Call your local emergency services (911 in the [place] Do not drive yourself to the hospital.  This information is not intended to replace advice given to you by your health care provider. Make sure you discuss any questions you have with your health care provider.   Document Released: 04/17/2008 Document Revised: 03/16/2015 Document Reviewed: 03/03/2014 [employer] Interactive Patient Education [employer]

## 2015-09-21 LAB — LIPID PANEL
Cholesterol: 172 mg/dL (ref 125–200)
HDL: 31 mg/dL — AB (ref >=40)
LDL CALC: 81 mg/dL (ref <130)
TRIGLYCERIDES: 298 mg/dL — AB (ref <150)
Total CHOL/HDL Ratio: 5.5 Ratio — ABNORMAL HIGH (ref <=5.0)
VLDL: 60 mg/dL — AB (ref <30)

## 2015-09-21 LAB — COMPLETE METABOLIC PANEL WITH GFR
ALT: 58 U/L — AB (ref 9–46)
AST: 26 U/L (ref 10–35)
Albumin: 4.3 g/dL (ref 3.6–5.1)
Alkaline Phosphatase: 111 U/L (ref 40–115)
BUN: 18 mg/dL (ref 7–25)
CALCIUM: 9.7 mg/dL (ref 8.6–10.3)
CHLORIDE: 95 mmol/L — AB (ref 98–110)
CO2: 26 mmol/L (ref 20–31)
CREATININE: 0.98 mg/dL (ref 0.70–1.33)
GFR, Est Non African American: 87 mL/min (ref >=60)
Glucose, Bld: 336 mg/dL — ABNORMAL HIGH (ref 65–99)
POTASSIUM: 5 mmol/L (ref 3.5–5.3)
Sodium: 132 mmol/L — ABNORMAL LOW (ref 135–146)
Total Bilirubin: 0.5 mg/dL (ref 0.2–1.2)
Total Protein: 7.3 g/dL (ref 6.1–8.1)

## 2015-09-21 LAB — HEMOGLOBIN A1C
HEMOGLOBIN A1C: 11.1 % — AB (ref <5.7)
MEAN PLASMA GLUCOSE: 272 mg/dL — AB (ref <117)

## 2015-09-21 LAB — GLUCOSE, RANDOM: Glucose, Bld: 336 mg/dL — ABNORMAL HIGH (ref 65–99)

## 2015-09-23 ENCOUNTER — Encounter: Payer: Self-pay | Admitting: Physician Assistant

## 2015-09-23 NOTE — Progress Notes (Signed)
[facility] [address], [place] [address] [phone]  Date:  09/20/2015   Name:  Daniel MarcusDonald Kulikowski   DOB:  February 15, 1961   MRN:  [id number]  PCP:  Dow AdolphMCPHERSON,BARBARA, MD    History of Present Illness:  [name] is a 54 y.o. male patient who [facility] for follow up of diabetes mellitus 2.   Patient was here 3 months prior, but refused blood work at that time.  He reports that he currently is not taking the metformin, and only takes the glipizide.  Reports that he was having dizziness with the metformin one day and discontinued.  Unknown whether he was compliantly taking the lopressor, or lisinopril at that time.  He states that he knows when his blood sugar is too low and too high.  He has not checked his sugar when he has the dizzy symptoms.    Morning glucose runs in the 200-400.   He reports that he tries to eat right, though also confesses that he has drinks orange juice in the morning, candies, and a coke once a week. --Last week bought halloween candy and proceeded to eat it all by himself. No change in vision. He will attempt to have eggs, bagel with cream cheese, mac and cheese at lunch.  Meats include chicken, fish, and salmon.  Tries to get in protein.  Also drinks sweet milk  with sugar).  He is followed by a nutritionist, but states that this is a struggle. Reports that he has numbness and tingling sensation in his feet.   Reports that he continues to have random chest pain at left midsternal border, that is not associated with exertion, sob, dizziness, palpitations, or leg swelling.  He states that he is always sweaty.  Last episode was more than one week ago.  No nausea or sour brash.  Not apparent when he is lying down.  He currently is not taking the lisinopril or lopressor at this time, stating that they have side effects.   Exercise: "Walking here and there"  Sleep varies, and he uses xanax only occasionally. He claims adderall is helpful  with his OCD nature, and with his anxiety.     Patient Active Problem List   Diagnosis Date Noted  . Vitamin D deficiency 04/23/2015  . Generalized anxiety disorder 04/23/2015  . Atypical chest pain 04/23/2015  . Diabetes type 2, uncontrolled (HCC) 04/23/2015  . Rosacea 04/22/2014  . Noncompliance with diabetes treatment 02/17/2014  . HYPERLIPIDEMIA 04/30/2008  . DEPRESSION 04/30/2008  . GERD 04/30/2008    Past Medical History  Diagnosis Date  . Diabetes mellitus   . Anxiety   . Arthritis     Past Surgical History  Procedure Laterality Date  . Varicose vein surgery      bilateral    Social History  Substance Use Topics  . Smoking status: Current Some Day Smoker    Types: Cigars  . Smokeless tobacco: Never Used  . Alcohol Use: No    No family history on file.  Not on File  Medication list has been reviewed and updated.  Current Outpatient Prescriptions on File Prior to Visit  Medication Sig Dispense Refill  . aspirin 81 MG tablet Take 1 tablet (81 mg total) by mouth daily. 60 tablet 6  . glucose blood test strip Test blood sugar daily. Dx code: 250.60. 100 each 3  . Lancets MISC Test blood sugar daily. Dx code 250.60 100 each 3  .  omeprazole (PRILOSEC) 40 MG capsule TAKE ONE CAPSULE BY MOUTH ONCE DAILY 1 30 capsule 1  . Vitamin D, Ergocalciferol, (DRISDOL) 50000 UNITS CAPS capsule Take 1 capsule (50,000 Units total) by mouth every 7 (seven) days. 4 capsule 5  . [DISCONTINUED] Omeprazole (PRILOSEC PO) Take 1 tablet by mouth every other day.     No current facility-administered medications on file prior to visit.    ROS ROS otherwise unremarkable unless listed above.   Physical Examination: BP 138/88 mmHg  Pulse 102  Temp(Src) 97.8 F (36.6 C) (Oral)  Resp 16  Wt 218 lb (98.884 kg) Ideal Body Weight:    Physical Exam  Constitutional: He is oriented to person, place, and time. He appears well-developed and well-nourished. No distress.  HENT:  Head:  Normocephalic and atraumatic.  Eyes: Conjunctivae and EOM are normal. Pupils are equal, round, and reactive to light.  Cardiovascular: Normal rate, regular rhythm, normal heart sounds and intact distal pulses.  Exam reveals no friction rub.   No murmur heard. Pulmonary/Chest: Effort normal. No respiratory distress.  Feet:  Right Foot:  Protective Sensation: 6 sites tested.6 sites sensed. Skin Integrity: Negative for ulcer, skin breakdown, erythema or warmth.  Left Foot:  Protective Sensation: 6 sites tested. 6 sites sensed. Skin Integrity: Negative for ulcer, skin breakdown, erythema or warmth.  Neurological: He is alert and oriented to person, place, and time.  Skin: Skin is warm and dry. He is not diaphoretic.  Psychiatric: He has a normal mood and affect. His behavior is normal.   EKG normal as interpreted by Dr. Nilda Simmer  Assessment and Plan: [name] is a 54 y.o. male who is here today for diabetic follow up.    1. Chest pain, unspecified chest pain type Due to co-morbidities, a cardiology consult is warranted.  I am advising that he stay on the low dose lopressor prior to this visit.  - EKG 12-Lead - Ambulatory referral to Cardiology - Lipid panel  2. Uncontrolled type 2 diabetes mellitus with hyperglycemia, without long-term current use of insulin (HCC) -We will need to replace the metformin at this time.  This was discussed with patient.  He is concerned of side effects, and I am concerned that he will, again, be non-compliant without appropriate follow up that he is not taking the medication.  We will await lab results prior to choosing a medication. -eyes followed by Dr. Nile Riggs, and sees a nutritionist Advising checking blood sugar in the morning, and before dinner, and if symptomatic dizziness.  Also advised and instructed how to check pulse.   - lisinopril (PRINIVIL,ZESTRIL) 5 MG tablet; Take 1 tablet (5 mg total) by mouth daily.  Dispense: 30 tablet; Refill: 1 -  glipizide (GLUCOTROL) 5 MG tablet; Take 1 tablet (5 mg total) by mouth 2 (two) times daily before a meal.  Dispense: 60 tablet; Refill: 1 - Lipid panel  3. ADD (attention deficit disorder) - amphetamine-dextroamphetamine (ADDERALL) 15 MG tablet; Take 0.5-1 tablets by mouth daily.  Dispense: 30 tablet; Refill: 0  4. Generalized anxiety disorder  - amphetamine-dextroamphetamine (ADDERALL) 15 MG tablet; Take 0.5-1 tablets by mouth daily.  Dispense: 30 tablet; Refill: 0 - ALPRAZolam (XANAX) 0.5 MG tablet; Take 0.5-1 tablets (0.25-0.5 mg total) by mouth at bedtime as needed for anxiety or sleep.  Dispense: 30 tablet; Refill: 0 - metoprolol tartrate (LOPRESSOR) 25 MG tablet; Take 1 tablet (25 mg total) by mouth daily.  Dispense: 60 tablet; Refill: 1  5. Hemoglobin A1c 8.0 or greater -  Glucose, Random - COMPLETE METABOLIC PANEL WITH GFR - Hemoglobin A1c - Lipid panel  6. Essential hypertension - metoprolol tartrate (LOPRESSOR) 25 MG tablet; Take 1 tablet (25 mg total) by mouth daily.  Dispense: 60 tablet; Refill: 1  7. Hyperlipidemia - Lipid panel   Trena Platt, PA-C [facility] 09/23/2015 9:21 AM

## 2015-09-24 ENCOUNTER — Encounter: Payer: Self-pay | Admitting: *Deleted

## 2015-09-24 NOTE — Patient Instructions (Signed)
Plan:  Call the MD office to make an appointment and see about what your options are for follow up. Consider checking BG before your breakfast and supper each day   Continue taking your diabetes pill before breakfast and supper and blood pressure medications as directed by MD If you feel shakey or unsteady, check your BG to see if that feeling is a low BG or something else If your BG is below 100, drink 4 oz OJ to bring it back up again

## 2015-10-19 ENCOUNTER — Telehealth: Payer: Self-pay | Admitting: *Deleted

## 2015-10-19 DIAGNOSIS — E1165 Type 2 diabetes mellitus with hyperglycemia: Secondary | ICD-10-CM

## 2015-10-19 MED ORDER — DAPAGLIFLOZIN PROPANEDIOL 10 MG PO TABS: 10.0000 mg | ORAL_TABLET | Freq: Every day | ORAL | Status: DC

## 2015-10-19 NOTE — Telephone Encounter (Signed)
-----   Message from Garnetta BuddyStephanie D English, [place] sent at 10/15/2015 12:47 PM EST ----- Attempted to contact patient.  Please alert that his glucose is elevated to 11.1.  I would like us to start a new medication as we discussed.  Called farxiga.  This will help control blood sugar better.  I would like us to do this with the glucotrol too.  He will take the farxiga once daily, we would start with 5mg  once daily for 5 days then move to 10mg .  This medication has offered very little side effects for patient.  Most popular complaint is uti, which is not common, and better if you are hydrating well with water.  (64 oz daily).  Please ask if he is willing to start this regimen.  I will place the order if he is.  He will return in 1 month.  Kidney function is good, and his liver function is a bit elevated.  farxiga will not effect this.

## 2015-10-19 NOTE — Telephone Encounter (Signed)
Pt notified of results and will take the farxiga.  He made an appt one month from now to see you.  Please send in rx.  Call pt when rx is sent in.

## 2015-10-19 NOTE — Telephone Encounter (Signed)
Filled prescription. Please alert that this has been sent to his pharmacy.  Remember 1/2 tablet first 5 days.  Then 1 tablet after that.  Glad appt. Is set!!  Remember to hydrate with water water water.

## 2015-10-20 NOTE — Telephone Encounter (Signed)
Left message on VM.

## 2015-10-28 ENCOUNTER — Telehealth: Payer: Self-pay

## 2015-10-28 ENCOUNTER — Ambulatory Visit: Payer: Medicare Other | Admitting: *Deleted

## 2015-10-28 DIAGNOSIS — E1142 Type 2 diabetes mellitus with diabetic polyneuropathy: Secondary | ICD-10-CM

## 2015-10-28 DIAGNOSIS — IMO0002 Reserved for concepts with insufficient information to code with codable children: Secondary | ICD-10-CM

## 2015-10-28 DIAGNOSIS — E1165 Type 2 diabetes mellitus with hyperglycemia: Principal | ICD-10-CM

## 2015-10-28 MED ORDER — CANAGLIFLOZIN 100 MG PO TABS: 100.0000 mg | ORAL_TABLET | Freq: Every day | ORAL | Status: DC

## 2015-10-28 NOTE — Telephone Encounter (Signed)
Judeth CornfieldStephanie, insurance doesn't cover Marcelline DeistFarxiga w/out pt first trying the preferred meds: Invokana, and Jardiance. The special coupon plan does not work with Medicare plans (which pt has). Do you want to change to one of the preferred?

## 2015-10-28 NOTE — Telephone Encounter (Signed)
Changing to invokana since the farxiga is not covered.  Invokana 100mg  qd for first 5 days, then increasing to 200mg  the following week.  we will see how it is going at your next appointment.

## 2015-11-02 ENCOUNTER — Telehealth: Payer: Self-pay

## 2015-11-02 NOTE — Telephone Encounter (Signed)
LMOM for pt about new Rx and asked for CB if any further questions.

## 2015-11-02 NOTE — Telephone Encounter (Signed)
Invokana is also requiring a PA for 2 a day dosing. Pt is being titrated up on dose. I completed PA on covermymeds. Pending.

## 2015-11-03 NOTE — Telephone Encounter (Signed)
PA approved through 11/12/16. Notified pharm.  

## 2015-11-22 ENCOUNTER — Ambulatory Visit: Payer: Self-pay | Admitting: Physician Assistant

## 2016-01-06 ENCOUNTER — Other Ambulatory Visit: Payer: Self-pay | Admitting: Urgent Care

## 2016-02-07 ENCOUNTER — Other Ambulatory Visit: Payer: Self-pay | Admitting: Urgent Care

## 2016-02-07 ENCOUNTER — Telehealth: Payer: Self-pay

## 2016-02-07 ENCOUNTER — Other Ambulatory Visit: Payer: Self-pay | Admitting: Physician Assistant

## 2016-02-07 NOTE — Telephone Encounter (Signed)
Pt called again regarding his heartburn meds and how important it is for him to get asap. Please call [phone]     [facility] [address]

## 2016-02-07 NOTE — Telephone Encounter (Signed)
Pt is wanting to make sure that stephanie english knows that he is out of his heartburn meds and needs them filled today and that he will be in to see her Thursday  Best number [phone]

## 2016-02-08 MED ORDER — OMEPRAZOLE 40 MG PO CPDR: 40.0000 mg | DELAYED_RELEASE_CAPSULE | Freq: Every day | ORAL | Status: DC

## 2016-02-08 NOTE — Telephone Encounter (Signed)
Rx sent 

## 2016-03-13 ENCOUNTER — Other Ambulatory Visit: Payer: Self-pay | Admitting: Physician Assistant

## 2016-07-22 ENCOUNTER — Other Ambulatory Visit: Payer: Self-pay | Admitting: Physician Assistant

## 2016-07-24 ENCOUNTER — Telehealth: Payer: Self-pay

## 2016-07-24 MED ORDER — OMEPRAZOLE 40 MG PO CPDR: 40.0000 mg | DELAYED_RELEASE_CAPSULE | Freq: Every day | ORAL | 0 refills | Status: DC

## 2016-07-24 NOTE — Telephone Encounter (Signed)
Pt called and stated that he is w/out his GERD med and the acid pain is so bad that he can't eat and is getting dizzy. I advised we need to see him and he agreed to come in to see Judeth CornfieldStephanie for f/up this week. I sent in 2 weeks RF to give him time to come in and explained same day appts. I advised pt to check his BS to make sure his dizziness is not related to that and to have someone bring him in or to ED if dizziness doesn't subside after eating. Pt agreed.

## 2016-08-09 ENCOUNTER — Ambulatory Visit (INDEPENDENT_AMBULATORY_CARE_PROVIDER_SITE_OTHER): Payer: Medicare Other | Admitting: Physician Assistant

## 2016-08-09 ENCOUNTER — Ambulatory Visit: Payer: Medicare Other

## 2016-08-09 DIAGNOSIS — F411 Generalized anxiety disorder: Secondary | ICD-10-CM | POA: Diagnosis not present

## 2016-08-09 DIAGNOSIS — E1165 Type 2 diabetes mellitus with hyperglycemia: Secondary | ICD-10-CM | POA: Diagnosis not present

## 2016-08-09 MED ORDER — ALPRAZOLAM 0.5 MG PO TABS: 0.2500 mg | ORAL_TABLET | Freq: Every evening | ORAL | 0 refills | Status: DC | PRN

## 2016-08-09 MED ORDER — GLIPIZIDE 5 MG PO TABS: 5.0000 mg | ORAL_TABLET | Freq: Two times a day (BID) | ORAL | 1 refills | Status: DC

## 2016-08-09 MED ORDER — LANCETS MISC: 3 refills | Status: AC

## 2016-08-09 MED ORDER — LISINOPRIL 5 MG PO TABS: 5.0000 mg | ORAL_TABLET | Freq: Every day | ORAL | 2 refills | Status: DC

## 2016-08-09 MED ORDER — OMEPRAZOLE 40 MG PO CPDR: 40.0000 mg | DELAYED_RELEASE_CAPSULE | Freq: Every day | ORAL | 5 refills | Status: DC

## 2016-08-09 MED ORDER — GLUCOSE BLOOD VI STRP: ORAL_STRIP | 3 refills | Status: AC

## 2016-08-09 NOTE — Progress Notes (Addendum)
Patient ID: [name], male   DOB: 17-Jan-1961, 55 y.o.   MRN: [id number] [facility] [address], [place] [address] [phone]  By signing my name below, I, [name], attest that this documentation has been prepared under the direction and in the presence of Trena PlattStephanie English, PA-C Electronically Signed: Charline BillsEssence Walker, ED Scribe 08/09/2016 at 12:20 PM.  Date:  08/09/2016   Name:  [name]   DOB:  17-Jan-1961   MRN:  [id number]  PCP:  Dow AdolphMCPHERSON,BARBARA, MD   History of Present Illness:   [name] is a 55 y.o. male patient who [facility]. Pt states that he has been seeing an endocrinologist Margaretmary BayleyPreston Clark, MD for neuropathy for the past 2 weeks. He has an upcoming appointment with his on 10/3. Pt states that he has noticed that his neuropathy has already improved. Pt states that he was advised to stop invokana and farxiga. Pt states that he has tried to cut back on sodas, eliminated coffee and sweet teas, tried to drink cranberry juice and water only. Pt also states that he has tried to eat more fruits, chew sugar-free gum and cut back on carbohydrates as well. Pt also reports anxiety over his DM. He states that he sometimes experiences "panic attacks" when his blood glucose is elevated. Pt is currently treating anxiety with Xanax. He has tried Seroquel in the past but states it just made him sleepy and he would feel "foggy" in the mornings.   Chief Complaint  Patient presents with   Medication Refill    ALL RXs and glucose supplies   Depression    answers positive in triage    Patient Active Problem List   Diagnosis Date Noted   Vitamin D deficiency 04/23/2015   Generalized anxiety disorder 04/23/2015   Atypical chest pain 04/23/2015   Diabetes type 2, uncontrolled (HCC) 04/23/2015   Rosacea 04/22/2014   Noncompliance with diabetes treatment 02/17/2014   HYPERLIPIDEMIA 04/30/2008   DEPRESSION 04/30/2008    GERD 04/30/2008    Past Medical History:  Diagnosis Date   Anxiety    Arthritis    Diabetes mellitus     Past Surgical History:  Procedure Laterality Date   VARICOSE VEIN SURGERY     bilateral    Social History  Substance Use Topics   Smoking status: Current Some Day Smoker    Types: Cigars   Smokeless tobacco: Never Used   Alcohol use No    No family history on file.  No Known Allergies  Medication list has been reviewed and updated.  Current Outpatient Prescriptions on File Prior to Visit  Medication Sig Dispense Refill   ALPRAZolam (XANAX) 0.5 MG tablet Take 0.5-1 tablets (0.25-0.5 mg total) by mouth at bedtime as needed for anxiety or sleep. 30 tablet 0   amphetamine-dextroamphetamine (ADDERALL) 15 MG tablet Take 0.5-1 tablets by mouth daily. 30 tablet 0   aspirin 81 MG tablet Take 1 tablet (81 mg total) by mouth daily. 60 tablet 6   canagliflozin (INVOKANA) 100 MG TABS tablet Take 1 tablet (100 mg total) by mouth daily before breakfast. Increase to 2 tablets before breakfast, after 5 days. 60 tablet 1   dapagliflozin propanediol (FARXIGA) 10 MG TABS tablet Take 10 mg by mouth daily. 30 tablet 5   glipiZIDE (GLUCOTROL) 5 MG tablet Take 1 tablet (5 mg total) by mouth 2 (two) times daily before a meal. 60 tablet 1   lisinopril (PRINIVIL,ZESTRIL)  5 MG tablet Take 1 tablet (5 mg total) by mouth daily. 30 tablet 1   metoprolol tartrate (LOPRESSOR) 25 MG tablet Take 1 tablet (25 mg total) by mouth daily. 60 tablet 1   omeprazole (PRILOSEC) 40 MG capsule Take 1 capsule (40 mg total) by mouth daily. 15 capsule 0   Vitamin D, Ergocalciferol, (DRISDOL) 50000 units CAPS capsule TAKE 1 CAPSULE BY MOUTH EVERY 7 DAYS 12 capsule 0   glucose blood test strip Test blood sugar daily. Dx code: 250.60. 100 each 3   Lancets MISC Test blood sugar daily. Dx code 250.60 100 each 3   [DISCONTINUED] Omeprazole (PRILOSEC PO) Take 1 tablet by mouth every other day.      No current facility-administered medications on file prior to visit.     Review of Systems  Psychiatric/Behavioral: The patient is nervous/anxious.     Physical Examination: BP 120/70 (BP Location: Right Arm, Patient Position: Sitting, Cuff Size: Large)    Pulse (!) 111    Temp 98 F (36.7 C) (Oral)    Resp 16    Ht 5\' 11"  (1.803 m)    Wt 211 lb 12.8 oz (96.1 kg)    SpO2 97%    BMI 29.54 kg/m  Ideal Body Weight: @FLOWAMB ([id number])@  Physical Exam  Constitutional: He is oriented to person, place, and time. He appears well-developed and well-nourished. No distress.  HENT:  Head: Normocephalic and atraumatic.  Eyes: Conjunctivae and EOM are normal. Pupils are equal, round, and reactive to light.  Cardiovascular: Normal rate.   Pulmonary/Chest: Effort normal. No respiratory distress.  Neurological: He is alert and oriented to person, place, and time.  Skin: Skin is warm and dry. He is not diaphoretic.  Psychiatric: He has a normal mood and affect. His behavior is normal.    Assessment and Plan: [name] is a 55 y.o. male who is here today for diabetes recheck. Will follow up with Dr. Margaretmary Bayley on medication regimen.  Advised that it is important that one person take care of one problem to avoid multi-pharmacy, and over-medicating.  We can refill the medication for the diabetes.  We will follow up pending lab results. Will refill the xanax at this time. Uncontrolled type 2 diabetes mellitus with hyperglycemia, without long-term current use of insulin (HCC) - Plan: glipiZIDE (GLUCOTROL) 5 MG tablet, lisinopril (PRINIVIL,ZESTRIL) 5 MG tablet  Generalized anxiety disorder - Plan: ALPRAZolam Prudy Feeler) 0.5 MG tablet  Trena Platt, PA-C [facility] 08/09/2016 12:20 PM

## 2016-08-09 NOTE — Patient Instructions (Addendum)
I will contact Dr. Chestine Sporelark today.  He is closed at this time.  I will contact you if further labs are needed at this time.  Please await contact for this.  Please check your blood sugar daily, and if you feel symptomatic.  Jot them down.  You can submit this to Dr. Chestine Sporelark on 08/15/2016    IF you received an x-ray today, you will receive an invoice from [facility]. Please contact [facility] at [phone] with questions or concerns regarding your invoice.   IF you received labwork today, you will receive an invoice from [facility]. Please contact [facility] at [phone] with questions or concerns regarding your invoice.   Our billing staff will not be able to assist you with questions regarding bills from these companies.  You will be contacted with the lab results as soon as they are available. The fastest way to get your results is to activate your My Chart account. Instructions are located on the last page of this paperwork. If you have not heard from us regarding the results in 2 weeks, please contact this office.

## 2016-09-28 ENCOUNTER — Other Ambulatory Visit: Payer: Self-pay | Admitting: Physician Assistant

## 2016-09-28 DIAGNOSIS — E1165 Type 2 diabetes mellitus with hyperglycemia: Secondary | ICD-10-CM

## 2017-02-04 ENCOUNTER — Other Ambulatory Visit: Payer: Self-pay | Admitting: Physician Assistant

## 2017-02-05 ENCOUNTER — Other Ambulatory Visit: Payer: Self-pay | Admitting: Physician Assistant

## 2017-02-05 DIAGNOSIS — E1165 Type 2 diabetes mellitus with hyperglycemia: Secondary | ICD-10-CM

## 2017-02-05 NOTE — Telephone Encounter (Signed)
Left message to schedule visit before this supply is exhausted.  Meds ordered this encounter  Medications  . omeprazole (PRILOSEC) 40 MG capsule    Sig: TAKE ONE CAPSULE BY MOUTH ONCE DAILY    Dispense:  30 capsule    Refill:  0    Please notify patient that s/he needs an office visit +/- labsfor additional refills.

## 2017-03-06 ENCOUNTER — Other Ambulatory Visit: Payer: Self-pay | Admitting: Physician Assistant

## 2017-04-10 ENCOUNTER — Telehealth: Payer: Self-pay

## 2017-04-10 NOTE — Telephone Encounter (Signed)
Called pt and left message to schedule medicare annual wellness visit - nr

## 2017-04-12 ENCOUNTER — Encounter: Payer: Self-pay | Admitting: Physician Assistant

## 2017-05-30 ENCOUNTER — Other Ambulatory Visit: Payer: Self-pay | Admitting: Physician Assistant

## 2017-05-30 DIAGNOSIS — F411 Generalized anxiety disorder: Secondary | ICD-10-CM

## 2017-06-07 ENCOUNTER — Ambulatory Visit: Payer: Medicare Other | Admitting: Physician Assistant

## 2017-06-08 ENCOUNTER — Ambulatory Visit (INDEPENDENT_AMBULATORY_CARE_PROVIDER_SITE_OTHER): Payer: Medicare Other | Admitting: Physician Assistant

## 2017-06-08 ENCOUNTER — Encounter: Payer: Self-pay | Admitting: Physician Assistant

## 2017-06-08 VITALS — BP 140/78 | HR 91 | Temp 97.8°F | Resp 18 | Ht 71.58 in | Wt 215.8 lb

## 2017-06-08 DIAGNOSIS — K219 Gastro-esophageal reflux disease without esophagitis: Secondary | ICD-10-CM

## 2017-06-08 MED ORDER — OMEPRAZOLE 40 MG PO CPDR: 40.0000 mg | DELAYED_RELEASE_CAPSULE | Freq: Every day | ORAL | 0 refills | Status: DC

## 2017-06-08 NOTE — Progress Notes (Signed)
    06/08/2017 2:55 PM   DOB: 09/27/1961 / MRN: [id number]  SUBJECTIVE:  [name] is a 56 y.o. male presenting for refills of prilosec. Former smoker. Has a history of diabetes. Denies dysphagia or odynophagia. Denies sudden weight loss. Denies any GERD with the medication. Tells me that if he misses a dose or two that he will not be able to eat.    He has No Known Allergies.   He  has a past medical history of Anxiety; Arthritis; and Diabetes mellitus.    He  reports that he quit smoking about 4 months ago. His smoking use included Cigars. He has never used smokeless tobacco. He reports that he does not drink alcohol or use drugs. He  has no sexual activity history on file. The patient  has a past surgical history that includes Varicose vein surgery.  His family history is not on file.  Review of Systems  Constitutional: Negative for chills, diaphoresis and fever.  Eyes: Negative.   Respiratory: Negative for cough, hemoptysis, sputum production, shortness of breath and wheezing.   Cardiovascular: Negative for chest pain, orthopnea and leg swelling.  Gastrointestinal: Negative for nausea.  Skin: Negative for rash.  Neurological: Negative for dizziness, sensory change, speech change, focal weakness and headaches.    The problem list and medications were reviewed and updated by myself where necessary and exist elsewhere in the encounter.   OBJECTIVE:  BP 140/78   Pulse 91   Temp 97.8 F (36.6 C) (Oral)   Resp 18   Ht 5' 11.58" (1.818 m)   Wt 215 lb 12.8 oz (97.9 kg)   SpO2 98%   BMI 29.62 kg/m   Wt Readings from Last 3 Encounters:  06/08/17 215 lb 12.8 oz (97.9 kg)  08/09/16 211 lb 12.8 oz (96.1 kg)  09/20/15 218 lb (98.9 kg)     Physical Exam  Constitutional: He appears well-developed. He is active and cooperative.  Non-toxic appearance.  Cardiovascular: Normal rate, regular rhythm, S1 normal, S2 normal, normal heart sounds, intact distal pulses and normal pulses.   Exam reveals no gallop and no friction rub.   No murmur heard. Pulmonary/Chest: Effort normal. No stridor. No tachypnea. No respiratory distress. He has no wheezes. He has no rales.  Abdominal: Soft. Bowel sounds are normal. He exhibits no distension and no mass. There is no tenderness. There is no rebound and no guarding.  Musculoskeletal: He exhibits no edema.  Neurological: He is alert.  Skin: Skin is warm and dry. He is not diaphoretic. No pallor.  Vitals reviewed.     No results found for this or any previous visit (from the past 72 hour(s)).  No results found.  ASSESSMENT AND PLAN:  Dorinda HillDonald was seen today for medication refill.  Diagnoses and all orders for this visit:  Gastroesophageal reflux disease without esophagitis: Well controlled and without red flags.  Medication refills provided.  He will see Ms. English back next week for med check.  -     omeprazole (PRILOSEC) 40 MG capsule; Take 1 capsule (40 mg total) by mouth daily.    The patient is advised to call or return to clinic if he does not see an improvement in symptoms, or to seek the care of the closest emergency department if he worsens with the above plan.   Deliah BostonMichael Emonii Wienke, MHS, PA-C [facility] 06/08/2017 2:55 PM

## 2017-06-08 NOTE — Patient Instructions (Addendum)
Please see Judeth CornfieldStephanie back neck week for Xanax and diabetes check.     IF you received an x-ray today, you will receive an invoice from [facility]. Please contact [facility] at [phone] with questions or concerns regarding your invoice.   IF you received labwork today, you will receive an invoice from [facility]. Please contact [facility] at [phone] with questions or concerns regarding your invoice.   Our billing staff will not be able to assist you with questions regarding bills from these companies.  You will be contacted with the lab results as soon as they are available. The fastest way to get your results is to activate your My Chart account. Instructions are located on the last page of this paperwork. If you have not heard from us regarding the results in 2 weeks, please contact this office.

## 2017-06-13 ENCOUNTER — Ambulatory Visit: Payer: Medicare Other | Admitting: Physician Assistant

## 2017-06-21 ENCOUNTER — Encounter: Payer: Self-pay | Admitting: Physician Assistant

## 2017-06-21 ENCOUNTER — Ambulatory Visit (INDEPENDENT_AMBULATORY_CARE_PROVIDER_SITE_OTHER): Payer: Medicare Other | Admitting: Physician Assistant

## 2017-06-21 VITALS — BP 138/82 | HR 101 | Temp 98.0°F | Resp 18 | Ht 71.65 in | Wt 217.0 lb

## 2017-06-21 DIAGNOSIS — E1165 Type 2 diabetes mellitus with hyperglycemia: Secondary | ICD-10-CM

## 2017-06-21 DIAGNOSIS — IMO0002 Reserved for concepts with insufficient information to code with codable children: Secondary | ICD-10-CM

## 2017-06-21 DIAGNOSIS — F988 Other specified behavioral and emotional disorders with onset usually occurring in childhood and adolescence: Secondary | ICD-10-CM | POA: Diagnosis not present

## 2017-06-21 DIAGNOSIS — E1142 Type 2 diabetes mellitus with diabetic polyneuropathy: Secondary | ICD-10-CM | POA: Diagnosis not present

## 2017-06-21 DIAGNOSIS — K219 Gastro-esophageal reflux disease without esophagitis: Secondary | ICD-10-CM | POA: Diagnosis not present

## 2017-06-21 DIAGNOSIS — F411 Generalized anxiety disorder: Secondary | ICD-10-CM | POA: Diagnosis not present

## 2017-06-21 MED ORDER — ALPRAZOLAM 0.5 MG PO TABS: 0.2500 mg | ORAL_TABLET | Freq: Every evening | ORAL | 0 refills | Status: DC | PRN

## 2017-06-21 MED ORDER — LISINOPRIL 5 MG PO TABS: 5.0000 mg | ORAL_TABLET | Freq: Every day | ORAL | 2 refills | Status: DC

## 2017-06-21 MED ORDER — AMPHETAMINE-DEXTROAMPHETAMINE 15 MG PO TABS: 7.5000 mg | ORAL_TABLET | Freq: Every day | ORAL | 0 refills | Status: DC

## 2017-06-21 MED ORDER — OMEPRAZOLE 40 MG PO CPDR: 40.0000 mg | DELAYED_RELEASE_CAPSULE | Freq: Every day | ORAL | 0 refills | Status: DC

## 2017-06-21 NOTE — Progress Notes (Signed)
PRIMARY CARE AT [facility] [address], [place] [address] [phone]  Date:  06/21/2017   Name:  [name]   DOB:  21-Mar-1961   MRN:  [id number]  PCP:  Joretta Bachelor, PA    History of Present Illness:  [name] is a 56 y.o. male patient who presents to PCP with  Chief Complaint  Patient presents with  . Diabetes    6 month follow-up  . Medication Refill    Lisinopril,Alprazolam,Adderall  . Anxiety     Patient is here today for cc of medication refill.  Patient states that he has not been doing what he is supposed to do. Blood sugar can be 200-400s. He is eating carbs, and drinking sodas despite advisement of restrictions.  He has been switched to the glucotrol and [place].  He reports that he has only taken the glipizide.  He reports that he feels strange when he takes both, though he is not able to use the right verbage of    Reflux is doing ok.  He continues to use for focus.  This is as needed which is barely once per day.    Patient Active Problem List   Diagnosis Date Noted  . Vitamin D deficiency 04/23/2015  . Generalized anxiety disorder 04/23/2015  . Atypical chest pain 04/23/2015  . Diabetes type 2, uncontrolled ([place]) 04/23/2015  . Rosacea 04/22/2014  . Noncompliance with diabetes treatment 02/17/2014  . HYPERLIPIDEMIA 04/30/2008  . DEPRESSION 04/30/2008  . GERD 04/30/2008    Past Medical History:  Diagnosis Date  . Anxiety   . Arthritis   . Diabetes mellitus     Past Surgical History:  Procedure Laterality Date  . VARICOSE VEIN SURGERY     bilateral    Social History  Substance Use Topics  . Smoking status: Former Smoker    Types: Cigars    Quit date: 01/09/2017  . Smokeless tobacco: Never Used  . Alcohol use No    History reviewed. No pertinent family history.  No Known Allergies  Medication list has been reviewed and updated.  Current Outpatient Prescriptions on File Prior to Visit  Medication Sig Dispense Refill  .  ALPRAZolam (XANAX) 0.5 MG tablet Take 0.5-1 tablets (0.25-0.5 mg total) by mouth at bedtime as needed for anxiety or sleep. 30 tablet 0  . amphetamine-dextroamphetamine (ADDERALL) 15 MG tablet Take 0.5-1 tablets by mouth daily. 30 tablet 0  . aspirin 81 MG tablet Take 1 tablet (81 mg total) by mouth daily. 60 tablet 6  . canagliflozin (INVOKANA) 100 MG TABS tablet Take 1 tablet (100 mg total) by mouth daily before breakfast. Increase to 2 tablets before breakfast, after 5 days. 60 tablet 1  . dapagliflozin propanediol (FARXIGA) 10 MG TABS tablet Take 10 mg by mouth daily. 30 tablet 5  . glipiZIDE (GLUCOTROL) 5 MG tablet TAKE ONE TABLET BY MOUTH TWICE DAILY BEFORE MEAL(S). 60 tablet 0  . glucose blood test strip Test blood sugar daily. Dx code: 250.60. 100 each 3  . Lancets MISC Test blood sugar daily. Dx code 250.60 100 each 3  . lisinopril (PRINIVIL,ZESTRIL) 5 MG tablet Take 1 tablet (5 mg total) by mouth daily. 30 tablet 2  . metoprolol tartrate (LOPRESSOR) 25 MG tablet Take 1 tablet (25 mg total) by mouth daily. 60 tablet 1  . omeprazole (PRILOSEC) 40 MG capsule Take 1 capsule (40 mg total) by mouth daily. 365 capsule 0  . Vitamin D, Ergocalciferol, (DRISDOL) 50000 units CAPS capsule  TAKE 1 CAPSULE BY MOUTH EVERY 7 DAYS 12 capsule 0  . [DISCONTINUED] Omeprazole (PRILOSEC PO) Take 1 tablet by mouth every other day.     No current facility-administered medications on file prior to visit.     ROS ROS otherwise unremarkable unless listed above.  Physical Examination: BP 138/82   Pulse (!) 101   Temp 98 F (36.7 C) (Oral)   Resp 18   Ht 5' 11.65" (1.82 m)   Wt 217 lb (98.4 kg)   SpO2 95%   BMI 29.72 kg/m  Ideal Body Weight: Weight in (lb) to have BMI = 25: 182.2  Physical Exam  Constitutional: He is oriented to person, place, and time. He appears well-developed and well-nourished. No distress.  HENT:  Head: Normocephalic and atraumatic.  Eyes: Pupils are equal, round, and reactive  to light. Conjunctivae and EOM are normal.  Cardiovascular: Normal rate.   Pulmonary/Chest: Effort normal. No respiratory distress.  Feet:  Right Foot:  Protective Sensation: 5 sites tested. 6 sites sensed.  Left Foot:  Protective Sensation: 6 sites tested. 6 sites sensed.  Neurological: He is alert and oriented to person, place, and time.  Skin: Skin is warm and dry. He is not diaphoretic.  Psychiatric: He has a normal mood and affect. His behavior is normal.     Assessment and Plan: [name] is a 56 y.o. male who is here today for cc of diabetes follow up , and refill of adderall, and alprazolam. Uncontrolled type 2 diabetes mellitus with diabetic polyneuropathy, without long-term current use of insulin ([place]) - Plan: CMP14+EGFR, Hemoglobin A1c, DME Other see comment  Generalized anxiety disorder - Plan: ALPRAZolam (XANAX) 0.5 MG tablet, amphetamine-dextroamphetamine (ADDERALL) 15 MG tablet  Attention deficit disorder, unspecified hyperactivity presence - Plan: amphetamine-dextroamphetamine (ADDERALL) 15 MG tablet  Uncontrolled type 2 diabetes mellitus with hyperglycemia, without long-term current use of insulin ([facility]) - Plan: lisinopril (PRINIVIL,ZESTRIL) 5 MG tablet  Gastroesophageal reflux disease without esophagitis - Plan: omeprazole (PRILOSEC) 40 MG capsule  [name], PA-C [facility] 8/16/201812:59 PM ]

## 2017-06-21 NOTE — Patient Instructions (Addendum)
  Please take your medication.   IF you received an x-ray today, you will receive an invoice from [facility]. Please contact [facility] at [phone] with questions or concerns regarding your invoice.   IF you received labwork today, you will receive an invoice from [facility]. Please contact [facility] at [phone] with questions or concerns regarding your invoice.   Our billing staff will not be able to assist you with questions regarding bills from these companies.  You will be contacted with the lab results as soon as they are available. The fastest way to get your results is to activate your My Chart account. Instructions are located on the last page of this paperwork. If you have not heard from us regarding the results in 2 weeks, please contact this office.

## 2017-06-22 LAB — CMP14+EGFR
A/G RATIO: 1.7 (ref 1.2–2.2)
ALK PHOS: 92 IU/L (ref 39–117)
ALT: 43 IU/L (ref 0–44)
AST: 24 IU/L (ref 0–40)
Albumin: 4.7 g/dL (ref 3.5–5.5)
BILIRUBIN TOTAL: 0.5 mg/dL (ref 0.0–1.2)
BUN/Creatinine Ratio: 19 (ref 9–20)
BUN: 17 mg/dL (ref 6–24)
CHLORIDE: 101 mmol/L (ref 96–106)
CO2: 22 mmol/L (ref 20–29)
Calcium: 9.9 mg/dL (ref 8.7–10.2)
Creatinine, Ser: 0.91 mg/dL (ref 0.76–1.27)
GFR calc non Af Amer: 94 mL/min/{1.73_m2} (ref >59)
GFR, EST AFRICAN AMERICAN: 109 mL/min/{1.73_m2} (ref >59)
GLUCOSE: 240 mg/dL — AB (ref 65–99)
Globulin, Total: 2.7 g/dL (ref 1.5–4.5)
POTASSIUM: 4.4 mmol/L (ref 3.5–5.2)
Sodium: 139 mmol/L (ref 134–144)
Total Protein: 7.4 g/dL (ref 6.0–8.5)

## 2017-06-22 LAB — HEMOGLOBIN A1C
ESTIMATED AVERAGE GLUCOSE: 235 mg/dL
HEMOGLOBIN A1C: 9.8 % — AB (ref 4.8–5.6)

## 2017-09-06 ENCOUNTER — Telehealth: Payer: Self-pay

## 2017-09-06 NOTE — Telephone Encounter (Signed)
Called pt to schedule Medicare Annual Wellness Visit. -nr  

## 2017-09-14 ENCOUNTER — Other Ambulatory Visit: Payer: Self-pay | Admitting: Physician Assistant

## 2017-09-14 DIAGNOSIS — F411 Generalized anxiety disorder: Secondary | ICD-10-CM

## 2017-12-02 ENCOUNTER — Other Ambulatory Visit: Payer: Self-pay | Admitting: Physician Assistant

## 2017-12-02 DIAGNOSIS — F411 Generalized anxiety disorder: Secondary | ICD-10-CM

## 2017-12-03 NOTE — Telephone Encounter (Signed)
Request for controlled substance 

## 2017-12-03 NOTE — Telephone Encounter (Signed)
Pt requesting refill last filled 09/14/17 no future appt scheduled. Pls Advise

## 2018-02-13 ENCOUNTER — Encounter: Payer: Self-pay | Admitting: Physician Assistant

## 2018-03-05 ENCOUNTER — Other Ambulatory Visit: Payer: Self-pay | Admitting: Physician Assistant

## 2018-03-05 DIAGNOSIS — F411 Generalized anxiety disorder: Secondary | ICD-10-CM

## 2018-03-05 NOTE — Telephone Encounter (Signed)
Called number in chart states number has been changed, no longer in service, disconnected. Pt must have f/u prior to refill alprazolam refill Last OV: 06/21/17 Last Refill:12/05/17 #30 tabs no RF  Pharmacy:[facility] [address]  Trena PlattStephanie English PA

## 2018-03-07 ENCOUNTER — Other Ambulatory Visit: Payer: Self-pay | Admitting: Physician Assistant

## 2018-03-07 DIAGNOSIS — F411 Generalized anxiety disorder: Secondary | ICD-10-CM

## 2018-03-07 DIAGNOSIS — F988 Other specified behavioral and emotional disorders with onset usually occurring in childhood and adolescence: Secondary | ICD-10-CM

## 2018-03-07 NOTE — Telephone Encounter (Signed)
Copied from CRM [id number]. Topic: Quick Communication - Rx Refill/Question >> Mar 07, 2018  4:52 PM Raquel SarnaHayes, Teresa G wrote: sitaGLIPtin-metformin (JANUMET) 50-1000 MG tablet - needing asap - pt has been without since this last weekend.  ALPRAZolam (XANAX) 0.5 MG tablet amphetamine-dextroamphetamine (ADDERALL) 15 MG tablet omeprazole (PRILOSEC) 40 MG capsule - 90 day supply   [facility] [address] - [place], [place] - [id number] [place] [id number] Phone: [phone] Fax: [phone]

## 2018-03-09 NOTE — Telephone Encounter (Signed)
Request for refill of Xanax, last filled on 12/05/17 #30 and Adderall, last filled on 06/21/17 30. Request for Janumet 50/1000mg  tab which has filled by historical provider previously. Pt also requesting refill for Prilosec but prescription on 06/21/17 shows that 365 caps were dispensed.   LOV: 06/21/17  Thomasene Ripple  [facility]     [id number] [place]

## 2018-03-13 ENCOUNTER — Other Ambulatory Visit: Payer: Self-pay | Admitting: Physician Assistant

## 2018-03-13 DIAGNOSIS — F411 Generalized anxiety disorder: Secondary | ICD-10-CM

## 2018-03-13 NOTE — Telephone Encounter (Signed)
Duplicate.  Request also sent on 03/05/18.

## 2018-06-24 ENCOUNTER — Ambulatory Visit: Payer: Medicare Other | Admitting: Emergency Medicine

## 2018-06-24 ENCOUNTER — Ambulatory Visit (INDEPENDENT_AMBULATORY_CARE_PROVIDER_SITE_OTHER): Payer: Medicare Other | Admitting: Physician Assistant

## 2018-06-24 ENCOUNTER — Encounter: Payer: Self-pay | Admitting: Physician Assistant

## 2018-06-24 ENCOUNTER — Other Ambulatory Visit: Payer: Self-pay

## 2018-06-24 VITALS — BP 122/82 | HR 83 | Temp 98.0°F | Resp 16 | Ht 72.44 in | Wt 203.0 lb

## 2018-06-24 DIAGNOSIS — F411 Generalized anxiety disorder: Secondary | ICD-10-CM

## 2018-06-24 DIAGNOSIS — I10 Essential (primary) hypertension: Secondary | ICD-10-CM

## 2018-06-24 DIAGNOSIS — I4949 Other premature depolarization: Secondary | ICD-10-CM | POA: Diagnosis not present

## 2018-06-24 DIAGNOSIS — E1165 Type 2 diabetes mellitus with hyperglycemia: Secondary | ICD-10-CM | POA: Diagnosis not present

## 2018-06-24 DIAGNOSIS — E1142 Type 2 diabetes mellitus with diabetic polyneuropathy: Secondary | ICD-10-CM | POA: Diagnosis not present

## 2018-06-24 DIAGNOSIS — Z79899 Other long term (current) drug therapy: Secondary | ICD-10-CM

## 2018-06-24 DIAGNOSIS — K219 Gastro-esophageal reflux disease without esophagitis: Secondary | ICD-10-CM

## 2018-06-24 LAB — POCT URINALYSIS DIP (MANUAL ENTRY)
BILIRUBIN UA: NEGATIVE
Blood, UA: NEGATIVE
LEUKOCYTES UA: NEGATIVE
Nitrite, UA: NEGATIVE
Protein Ur, POC: NEGATIVE mg/dL
Spec Grav, UA: 1.015 (ref 1.010–1.025)
UROBILINOGEN UA: 0.2 U/dL
pH, UA: 5.5 (ref 5.0–8.0)

## 2018-06-24 LAB — POCT GLYCOSYLATED HEMOGLOBIN (HGB A1C): HEMOGLOBIN A1C: 13.6 % — AB (ref 4.0–5.6)

## 2018-06-24 LAB — GLUCOSE, POCT (MANUAL RESULT ENTRY): POC GLUCOSE: 297 mg/dL — AB (ref 70–99)

## 2018-06-24 MED ORDER — METFORMIN HCL 1000 MG PO TABS: 1000.0000 mg | ORAL_TABLET | Freq: Two times a day (BID) | ORAL | 0 refills | Status: DC

## 2018-06-24 MED ORDER — DULAGLUTIDE 0.75 MG/0.5ML ~~LOC~~ SOAJ: 0.7500 mg | SUBCUTANEOUS | 2 refills | Status: DC

## 2018-06-24 MED ORDER — LISINOPRIL 5 MG PO TABS: 2.5000 mg | ORAL_TABLET | Freq: Every day | ORAL | 0 refills | Status: DC

## 2018-06-24 MED ORDER — OMEPRAZOLE 40 MG PO CPDR
40.0000 mg | DELAYED_RELEASE_CAPSULE | Freq: Every day | ORAL | 3 refills | Status: DC
Start: 2018-06-24 — End: 2019-03-27

## 2018-06-24 NOTE — Patient Instructions (Addendum)
For newly diagnosed diabetes, you are going to be started on two medications.  The other is metformin 1000 mg and you will increase this dose over the next four weeks. During the first four days, you will take 500mg  in the morning with food. During next four days, you will take 500mg  in the morning and 500mg  in the evening with food. During next four days, you will take 1000mg  in the morning and 500mg  in the evening with food. After that, you will take 1000mg  in the morning and 1000mg  in the evening with food. Common side effects of metformin include GI upset like nausea and diarrhea, which is why it is important to take with food.   Start trulicity injection once a week. Common side effects can include nausea, vomiting, decreased appetite, and weight loss.   Start checking your blood sugars daily. Check your sugars when you are fasting in the morning before a meal. Your goal is 70-100. Signs of hypoglycemia are lightheadedness, blurred vision, headache, and confusion.   Start drinking lots of water. Follow up on Friday of this week for reevaluation.   I have placed a referral to endocrinology and they should contact you within 2 weeks.   Thank you for letting me participate in your health and well being.    Diabetes is a very complicated disease...lets simplify it.   An easy way to look at it to understand the complications is if you think of the extra sugar floating in your blood stream as glass shards floating through your blood stream.   Diabetes affects your small vessels first: 1) The glass shards (sugar) scrapes down the tiny blood vessels in your eyes and lead to diabetic retinopathy, the leading cause of blindness in the [place]. Diabetes is the leading cause of newly diagnosed adult (6620 to 57 years of age) blindness in the [place].  2) The glass shards scratches down the tiny vessels of your legs leading to nerve damage called neuropathy and can lead to amputations of your feet. More  than 60% of all non-traumatic amputations of lower limbs occur in people with diabetes.  3) Over time the small vessels in your brain are shredded and closed off, individually this does not cause any problems but over a long period of time many of the small vessels being blocked can lead to Vascular Dementia.   4) Your kidney's are a filter system and have a "net" that keeps certain things in the body and lets bad things out. Sugar shreds this net and leads to kidney damage and eventually failure. Decreasing the sugar that is destroying the net and certain blood pressure medications can help stop or decrease progression of kidney disease. Diabetes was the primary cause of kidney failure in 44 percent of all new cases in 2011.  5) Diabetes also destroys the small vessels in your penis that lead to erectile dysfunction. Eventually the vessels are so damaged that you may not be responsive to cialis or viagra.   Diabetes and your large vessels: Your larger vessels consist of your coronary arteries in your heart and the carotid vessels to your brain. Diabetes or even increased sugars put you at 300% increased risk of heart attack and stroke and this is why.. The sugar scrapes down your large blood vessels and your body sees this as an internal injury and tries to repair itself. Just like you get a scab on your skin, your platelets will stick to the blood vessel wall trying to  heal it. This is why we have diabetics on low dose aspirin daily, this prevents the platelets from sticking and can prevent plaque formation. In addition, your body takes cholesterol and tries to shove it into the open wound. This is why we want your LDL, or bad cholesterol, below 70.   The combination of platelets and cholesterol over 5-10 years forms plaque that can break off and cause a heart attack or stroke.   PLEASE REMEMBER:   Diabetes is preventable! Up to 85 percent of complications and morbidities among individuals with  type 2 diabetes can be prevented, delayed, or effectively treated and minimized with regular visits to a health professional, appropriate monitoring and medication, and a healthy diet and lifestyle.  Here is some information to help you keep your heart healthy: Move it! - Aim for 30 mins of activity every day. Take it slowly at first. Talk to [place] before starting any new exercise program.   Lose it.  -Body Mass Index (BMI) can indicate if you need to lose weight. A healthy range is 18.5-24.9. For a BMI calculator, go to [employer]  Waist Management -Excess abdominal fat is a risk factor for heart disease, diabetes, asthma, stroke and more. Ideal waist circumference is less than 35" for women and less than 40" for men.   Eat Right -focus on fruits, vegetables, whole grains, and meals you make yourself. Avoid foods with trans fat and high sugar/sodium content.   Snooze or Snore? - Loud snoring can be a sign of sleep apnea, a significant risk factor for high blood pressure, heart attach, stroke, and heart arrhythmias.  Kick the habit -Quit Smoking! Avoid second hand smoke. A single cigarette raises your blood pressure for 20 mins and increases the risk of heart attack and stroke for the next 24 hours.   Are Aspirin and Supplements right for you? -Add ENTERIC COATED low dose 81 mg Aspirin daily OR can do every other day if you have easy bruising to protect your heart and head. As well as to reduce risk of Colon Cancer by 20 %, Skin Cancer by 26 % , Melanoma by 46% and Pancreatic cancer by 60%  Say "No to Stress -There may be little you can do about problems that cause stress. However, techniques such as long walks, meditation, and exercise can help you manage it.   Start Now! - Make changes one at a time and set reasonable goals to increase your likelihood of success.      Diabetes Mellitus and Nutrition When you have diabetes (diabetes mellitus), it is very important to have healthy  eating habits because your blood sugar (glucose) levels are greatly affected by what you eat and drink. Eating healthy foods in the appropriate amounts, at about the same times every day, can help you:  Control your blood glucose.  Lower your risk of heart disease.  Improve your blood pressure.  Reach or maintain a healthy weight.  Every person with diabetes is different, and each person has different needs for a meal plan. Your health care provider may recommend that you work with a diet and nutrition specialist (dietitian) to make a meal plan that is best for you. Your meal plan may vary depending on factors such as:  The calories you need.  The medicines you take.  Your weight.  Your blood glucose, blood pressure, and cholesterol levels.  Your activity level.  Other health conditions you have, such as heart or kidney disease.  How do carbohydrates  affect me? Carbohydrates affect your blood glucose level more than any other type of food. Eating carbohydrates naturally increases the amount of glucose in your blood. Carbohydrate counting is a method for keeping track of how many carbohydrates you eat. Counting carbohydrates is important to keep your blood glucose at a healthy level, especially if you use insulin or take certain oral diabetes medicines. It is important to know how many carbohydrates you can safely have in each meal. This is different for every person. Your dietitian can help you calculate how many carbohydrates you should have at each meal and for snack. Foods that contain carbohydrates include:  Bread, cereal, rice, pasta, and crackers.  Potatoes and corn.  Peas, beans, and lentils.  Milk and yogurt.  Fruit and juice.  Desserts, such as cakes, cookies, ice cream, and candy.  How does alcohol affect me? Alcohol can cause a sudden decrease in blood glucose (hypoglycemia), especially if you use insulin or take certain oral diabetes medicines. Hypoglycemia can  be a life-threatening condition. Symptoms of hypoglycemia (sleepiness, dizziness, and confusion) are similar to symptoms of having too much alcohol. If your health care provider says that alcohol is safe for you, follow these guidelines:  Limit alcohol intake to no more than 1 drink per day for nonpregnant women and 2 drinks per day for men. One drink equals 12 oz of beer, 5 oz of wine, or 1 oz of hard liquor.  Do not drink on an empty stomach.  Keep yourself hydrated with water, diet soda, or unsweetened iced tea.  Keep in mind that regular soda, juice, and other mixers may contain a lot of sugar and must be counted as carbohydrates.  What are tips for following this plan? Reading food labels  Start by checking the serving size on the label. The amount of calories, carbohydrates, fats, and other nutrients listed on the label are based on one serving of the food. Many foods contain more than one serving per package.  Check the total grams (g) of carbohydrates in one serving. You can calculate the number of servings of carbohydrates in one serving by dividing the total carbohydrates by 15. For example, if a food has 30 g of total carbohydrates, it would be equal to 2 servings of carbohydrates.  Check the number of grams (g) of saturated and trans fats in one serving. Choose foods that have low or no amount of these fats.  Check the number of milligrams (mg) of sodium in one serving. Most people should limit total sodium intake to less than 2,300 mg per day.  Always check the nutrition information of foods labeled as "low-fat" or "nonfat". These foods may be higher in added sugar or refined carbohydrates and should be avoided.  Talk to your dietitian to identify your daily goals for nutrients listed on the label. Shopping  Avoid buying canned, premade, or processed foods. These foods tend to be high in fat, sodium, and added sugar.  Shop around the outside edge of the grocery store. This  includes fresh fruits and vegetables, bulk grains, fresh meats, and fresh dairy. Cooking  Use low-heat cooking methods, such as baking, instead of high-heat cooking methods like deep frying.  Cook using healthy oils, such as olive, canola, or sunflower oil.  Avoid cooking with butter, cream, or high-fat meats. Meal planning  Eat meals and snacks regularly, preferably at the same times every day. Avoid going long periods of time without eating.  Eat foods high in fiber, such as  fresh fruits, vegetables, beans, and whole grains. Talk to your dietitian about how many servings of carbohydrates you can eat at each meal.  Eat 4-6 ounces of lean protein each day, such as lean meat, chicken, fish, eggs, or tofu. 1 ounce is equal to 1 ounce of meat, chicken, or fish, 1 egg, or 1/4 cup of tofu.  Eat some foods each day that contain healthy fats, such as avocado, nuts, seeds, and fish. Lifestyle   Check your blood glucose regularly.  Exercise at least 30 minutes 5 or more days each week, or as told by your health care provider.  Take medicines as told by your health care provider.  Do not use any products that contain nicotine or tobacco, such as cigarettes and e-cigarettes. If you need help quitting, ask your health care provider.  Work with a Veterinary surgeoncounselor or diabetes educator to identify strategies to manage stress and any emotional and social challenges. What are some questions to ask my health care provider?  Do I need to meet with a diabetes educator?  Do I need to meet with a dietitian?  What number can I call if I have questions?  When are the best times to check my blood glucose? Where to find more information:  American Diabetes Association: diabetes.org/food-and-fitness/food  Academy of Nutrition and Dietetics: https://www.vargas.com/www.eatright.org/resources/health/diseases-and-conditions/diabetes  [employer] Institute of Diabetes and Digestive and Kidney Diseases (NIH):  FindJewelers.czwww.niddk.nih.gov/health-information/diabetes/overview/diet-eating-physical-activity Summary  A healthy meal plan will help you control your blood glucose and maintain a healthy lifestyle.  Working with a diet and nutrition specialist (dietitian) can help you make a meal plan that is best for you.  Keep in mind that carbohydrates and alcohol have immediate effects on your blood glucose levels. It is important to count carbohydrates and to use alcohol carefully. This information is not intended to replace advice given to you by your health care provider. Make sure you discuss any questions you have with your health care provider. Document Released: 07/27/2005 Document Revised: 12/04/2016 Document Reviewed: 12/04/2016 Elsevier Interactive Patient Education  2018 [employer]    IF you received an x-ray today, you will receive an invoice from [facility]. Please contact [facility] at [phone] with questions or concerns regarding your invoice.   IF you received labwork today, you will receive an invoice from [facility]. Please contact educated on potential [facility] at [phone] with questions or concerns regarding your invoice.   Our billing staff will not be able to assist you with questions regarding bills from these companies.  You will be contacted with the lab results as soon as they are available. The fastest way to get your results is to activate your My Chart account. Instructions are located on the last page of this paperwork. If you have not heard from us regarding the results in 2 weeks, please contact this office.

## 2018-06-24 NOTE — Progress Notes (Signed)
MRN: [id number]  Subjective:   [name] is a 57 y.o. male who presents for follow up of chronic medical conditions.  1) T2DM: Diagnosis was made "years ago." Used to see endocrinology, was on janumet and glipizide. Stopped glipizide 2 months ago. Stopped janumet 1 month ago.  "I have not been checking my sugars, I have given up. I can never get refills when I call. I have bad luck with doctors here." Current symptoms include paresthesia of the feet, polydipsia, and polyuria.  Patient denies diaphoresis, abdominal pain, chest pain, SOB,confusion, nausea, vomiting, increased appetite, fatigue, visual disturbance, and weight loss.  Patient is checking their feet daily. Neuropathy is getting worse. Last diabetic eye exam eye exam: >1 year. Diet: "with diabetes you crave sugar all the time."Known diabetic complications: peripheral neuropathy.  No past medical history of pancreatitis or thyroid carcinoma.  No family history of thyroid carcinoma.  2) GERD: Controlled prilosec 21m daily. "I need my stomach medicine. My acid reflux is so bad if I do not have my medicine, I do not know why this office would not send in refills to the pharmacy. I only have one pill left and need this medicine tomorrow."   3) HTN: Has been out of lisinopril 551mand lopresser 2537mor at least 6 months. Not checking blood pressure. "I don't have a way to check it, I am on a budget.  "I hate coming here because I always have to wait and I hate waiting. I got here early and had to wait and that is what is making me so worked up right now. I don't like that every time I come here I have to see a new doctor. All of my doctors quit and I don't like spilling my heart out to everyone over and over again. My endocrinologist quit and that was very hard for me. I need a new diabetic doctor now."   Review of Systems  Constitutional: Negative for chills and weight loss.  Respiratory: Negative for wheezing.   Cardiovascular:  Negative for palpitations, orthopnea and leg swelling.  Genitourinary: Negative for dysuria, flank pain and hematuria.  Neurological: Negative for dizziness, sensory change, speech change, focal weakness, weakness and headaches.  Psychiatric/Behavioral: The patient is nervous/anxious.      Past Medical History:  Diagnosis Date  . Anxiety   . Arthritis   . Diabetes mellitus    No Patient Care Coordination Note on file.  Past Surgical History:  Procedure Laterality Date  . VARICOSE VEIN SURGERY     bilateral    Social History   Socioeconomic History  . Marital status: Single    Spouse name: Not on file  . Number of children: Not on file  . Years of education: Not on file  . Highest education level: Not on file  Occupational History  . Not on file  Social Needs  . Financial resource strain: Not on file  . Food insecurity:    Worry: Not on file    Inability: Not on file  . Transportation needs:    Medical: Not on file    Non-medical: Not on file  Tobacco Use  . Smoking status: Former Smoker    Types: Cigars    Last attempt to quit: 01/09/2017    Years since quitting: 1.4  . Smokeless tobacco: Never Used  Substance and Sexual Activity  . Alcohol use: No  . Drug use: No  . Sexual activity: Not on file  Lifestyle  .  Physical activity:    Days per week: Not on file    Minutes per session: Not on file  . Stress: Not on file  Relationships  . Social connections:    Talks on phone: Not on file    Gets together: Not on file    Attends religious service: Not on file    Active member of club or organization: Not on file    Attends meetings of clubs or organizations: Not on file    Relationship status: Not on file  . Intimate partner violence:    Fear of current or ex partner: Not on file    Emotionally abused: Not on file    Physically abused: Not on file    Forced sexual activity: Not on file  Other Topics Concern  . Not on file  Social History Narrative  . Not  on file        Objective:   PHYSICAL EXAM BP 122/82   Pulse 83   Temp 98 F (36.7 C) (Oral)   Resp 16   Ht 6' 0.44" (1.84 m)   Wt 203 lb (92.1 kg)   SpO2 97%   BMI 27.20 kg/m   Physical Exam  Constitutional: He is oriented to person, place, and time. He appears well-developed and well-nourished. No distress.  HENT:  Head: Normocephalic and atraumatic.  Mouth/Throat: Uvula is midline and oropharynx is clear and moist. Mucous membranes are dry. No tonsillar exudate.  Eyes: Pupils are equal, round, and reactive to light. Conjunctivae, EOM and lids are normal.  Neck: Normal range of motion.  Cardiovascular: Normal rate, normal heart sounds and intact distal pulses.  No murmur heard. Pulses:      Dorsalis pedis pulses are 2+ on the right side, and 2+ on the left side.       Posterior tibial pulses are 2+ on the right side, and 2+ on the left side.  Occasional ectopic beats noted with auscultation.   Pulmonary/Chest: Effort normal and breath sounds normal. He has no decreased breath sounds. He has no wheezes. He has no rhonchi. He has no rales.  Abdominal: Soft. Normal appearance and bowel sounds are normal. There is no tenderness.  Musculoskeletal:       Right lower leg: He exhibits no edema.       Left lower leg: He exhibits no edema.  Neurological: He is alert and oriented to person, place, and time.  Skin: Skin is warm and dry.  Psychiatric: His mood appears anxious.  Circumferential speech noted.   Vitals reviewed.   Diabetic Foot Exam - Simple   Simple Foot Form Visual Inspection No deformities, no ulcerations, no other skin breakdown bilaterally:  Yes Sensation Testing Intact to touch and monofilament testing bilaterally:  Yes Pulse Check Posterior Tibialis and Dorsalis pulse intact bilaterally:  Yes Comments     Results for orders placed or performed in visit on 06/24/18 (from the past 24 hour(s))  POCT glucose (manual entry)     Status: Abnormal    Collection Time: 06/24/18  6:42 PM  Result Value Ref Range   POC Glucose 297 (A) 70 - 99 mg/dl  POCT urinalysis dipstick     Status: Abnormal   Collection Time: 06/24/18  6:42 PM  Result Value Ref Range   Color, UA yellow yellow   Clarity, UA clear clear   Glucose, UA =500 (A) negative mg/dL   Bilirubin, UA negative negative   Ketones, POC UA trace (5) (A) negative mg/dL  Spec Grav, UA 1.015 1.010 - 1.025   Blood, UA negative negative   pH, UA 5.5 5.0 - 8.0   Protein Ur, POC negative negative mg/dL   Urobilinogen, UA 0.2 0.2 or 1.0 E.U./dL   Nitrite, UA Negative Negative   Leukocytes, UA Negative Negative  POCT glycosylated hemoglobin (Hb A1C)     Status: Abnormal   Collection Time: 06/24/18  6:49 PM  Result Value Ref Range   Hemoglobin A1C 13.6 (A) 4.0 - 5.6 %   HbA1c POC (<> result, manual entry)     HbA1c, POC (prediabetic range)     HbA1c, POC (controlled diabetic range)     BP Readings from Last 3 Encounters:  06/24/18 122/82  06/21/17 138/82  06/08/17 140/78    EKG shows sinus rhythm with rate of 76bpm. Few PACs noted.  No acute ST or T wave abnormalities.  Findings presented and discussed with Dr. Brigitte Pulse.   Assessment and Plan :  1. Type 2 diabetes mellitus with hyperglycemia, without long-term current use of insulin (HCC) Uncontrolled.  A1c 13.6.POC glucose 297.  Positive glucosuria with trace ketones.  Symptoms include polyuria and polydipsia.  Otherwise asymptomatic.  Do not suspect DKA at this time.  Patient is overall well-appearing, NAD.  Mucous membranes are mildly dry, but otherwise normal exam. He does appear overall anxious but this is his baseline.  Labs pending.  Suggest oral hydration, restarting metformin and adding on Trulicity at this time.  Referral to endocrinology placed.  Will restart lisinopril at half tablet due to blood pressure reading today in office.  Educated on potential complications of uncontrolled diabetes.  Patient is motivated to make  lifestyle modifications that will aid in controlling his diabetes.  He also agrees to follow-up appropriately in office.  The plan for close follow-up in a few days for reevaluation.  Given strict ED precautions. - POCT glucose (manual entry) - POCT glycosylated hemoglobin (Hb A1C) - POCT urinalysis dipstick - CBC with Differential/Platelet - CMP14+EGFR - Lipid panel - TSH - Microalbumin/Creatinine Ratio, Urine - Ambulatory referral to Ophthalmology - HM Diabetes Foot Exam - Dulaglutide (TRULICITY) 2.24 MG/5.0IB SOPN; Inject 0.75 mg into the skin once a week.  Dispense: 4 pen; Refill: 2 - metFORMIN (GLUCOPHAGE) 1000 MG tablet; Take 1 tablet (1,000 mg total) by mouth 2 (two) times daily with a meal.  Dispense: 180 tablet; Refill: 0 - Ambulatory referral to Endocrinology - lisinopril (PRINIVIL,ZESTRIL) 5 MG tablet; Take 0.5 tablets (2.5 mg total) by mouth daily.  Dispense: 90 tablet; Refill: 0 - Ambulatory referral to diabetic education  2. Diabetic polyneuropathy associated with type 2 diabetes mellitus ([place])  3. Gastroesophageal reflux disease without esophagitis Refills provided. - omeprazole (PRILOSEC) 40 MG capsule; Take 1 capsule (40 mg total) by mouth daily.  Dispense: 90 capsule; Refill: 3  4. Current use of proton pump inhibitor - Magnesium  5. Ectopic heartbeat Ectopic heartbeats auscultated on exam.  EKG with PACs.  Patient is asymptomatic. Will monitor at f/u visit.  - EKG 12-Lead  6. Generalized anxiety disorder Appears uncontrolled.  Takes Xanax as needed for sleep and anxiety.  May benefit from long-acting medication such as SSRI.  Will address at follow-up visit.  7. Essential hypertension Well controlled without blood pressure medication at this time. Will add on lisinopril for kidney protection at 2.5 mg daily.   Side effects, risks, benefits, and alternatives of the medications and treatment plan prescribed today were discussed, and patient expressed  understanding of the instructions given.  No barriers to understanding were identified. Red flags discussed in detail. Pt expressed understanding regarding what to do in case of emergency/urgent symptoms.  Note - This record has been created using [employer].  Chart creation errors have been sought, but may not always  have been located. Such creation errors do not reflect on  the standard of medical care.  A total of 40 minutes was spent in the room with the patient, greater than 50% of which was in counseling/coordination of care regarding chronic medical conditions as above.   Tenna Delaine, PA-C  [facility] 06/24/2018 6:55 PM

## 2018-06-25 ENCOUNTER — Encounter: Payer: Self-pay | Admitting: Internal Medicine

## 2018-06-25 LAB — MICROALBUMIN / CREATININE URINE RATIO
Creatinine, Urine: 74.8 mg/dL
MICROALB/CREAT RATIO: 20.7 mg/g{creat} (ref 0.0–30.0)
MICROALBUM., U, RANDOM: 15.5 ug/mL

## 2018-06-25 LAB — CMP14+EGFR
A/G RATIO: 2.1 (ref 1.2–2.2)
ALT: 62 IU/L — AB (ref 0–44)
AST: 32 IU/L (ref 0–40)
Albumin: 5.1 g/dL (ref 3.5–5.5)
Alkaline Phosphatase: 112 IU/L (ref 39–117)
BUN/Creatinine Ratio: 17 (ref 9–20)
BUN: 15 mg/dL (ref 6–24)
Bilirubin Total: 0.6 mg/dL (ref 0.0–1.2)
CALCIUM: 9.9 mg/dL (ref 8.7–10.2)
CO2: 22 mmol/L (ref 20–29)
Chloride: 98 mmol/L (ref 96–106)
Creatinine, Ser: 0.87 mg/dL (ref 0.76–1.27)
GFR calc Af Amer: 111 mL/min/{1.73_m2} (ref >59)
GFR, EST NON AFRICAN AMERICAN: 96 mL/min/{1.73_m2} (ref >59)
GLUCOSE: 299 mg/dL — AB (ref 65–99)
Globulin, Total: 2.4 g/dL (ref 1.5–4.5)
POTASSIUM: 4.6 mmol/L (ref 3.5–5.2)
Sodium: 139 mmol/L (ref 134–144)
TOTAL PROTEIN: 7.5 g/dL (ref 6.0–8.5)

## 2018-06-25 LAB — CBC WITH DIFFERENTIAL/PLATELET
BASOS ABS: 0 10*3/uL (ref 0.0–0.2)
Basos: 0 %
EOS (ABSOLUTE): 0.3 10*3/uL (ref 0.0–0.4)
Eos: 3 %
Hematocrit: 47 % (ref 37.5–51.0)
Hemoglobin: 16 g/dL (ref 13.0–17.7)
IMMATURE GRANS (ABS): 0 10*3/uL (ref 0.0–0.1)
IMMATURE GRANULOCYTES: 0 %
Lymphocytes Absolute: 2.9 10*3/uL (ref 0.7–3.1)
Lymphs: 28 %
MCH: 29.1 pg (ref 26.6–33.0)
MCHC: 34 g/dL (ref 31.5–35.7)
MCV: 86 fL (ref 79–97)
MONOS ABS: 0.6 10*3/uL (ref 0.1–0.9)
Monocytes: 5 %
NEUTROS PCT: 64 %
Neutrophils Absolute: 6.5 10*3/uL (ref 1.4–7.0)
PLATELETS: 248 10*3/uL (ref 150–450)
RBC: 5.5 x10E6/uL (ref 4.14–5.80)
RDW: 14.1 % (ref 12.3–15.4)
WBC: 10.4 10*3/uL (ref 3.4–10.8)

## 2018-06-25 LAB — LIPID PANEL
CHOL/HDL RATIO: 5.5 ratio — AB (ref 0.0–5.0)
Cholesterol, Total: 213 mg/dL — ABNORMAL HIGH (ref 100–199)
HDL: 39 mg/dL — AB (ref >39)
LDL Calculated: 130 mg/dL — ABNORMAL HIGH (ref 0–99)
Triglycerides: 221 mg/dL — ABNORMAL HIGH (ref 0–149)
VLDL Cholesterol Cal: 44 mg/dL — ABNORMAL HIGH (ref 5–40)

## 2018-06-25 LAB — MAGNESIUM: Magnesium: 2.1 mg/dL (ref 1.6–2.3)

## 2018-06-25 LAB — TSH: TSH: 2.8 u[IU]/mL (ref 0.450–4.500)

## 2018-06-28 NOTE — Progress Notes (Deleted)
    MRN: [id number]  Subjective:   [name] is a 57 y.o. male who presents for follow up of Type 2 diabetes mellitus.   Diagnosis was made ***. Patient is currently managed with {dm interventions:14074}. Admits {good/fair/poor:33178} compliance. Denies adverse effects including metallic taste, hypoglycemia, nausea, vomiting, ***.   Patient {is/are not:32546} checking home blood sugars. Home blood sugar records: {dm home sugars:14018}. Current symptoms include {dm sx:14075}. Patient denies {dm sx:19199}. Patient {is/are not:32546} checking their feet daily. *** foot concerns. Last diabetic eye exam eye exam ***.   Diet *** {diet habits:16563}. Exercise includes {exercise types:16438}. Denies smoking or alcohol use.   Known diabetic complications: {diabetes complications:1215}  Immunizations: Flu vaccine ***, shingles ***, pneumococal vaccine ***  Other concerns: ***   Objective:   PHYSICAL EXAM There were no vitals taken for this visit.  Physical Exam  Diabetic Foot Exam - Simple   No data filed      No results found for this or any previous visit (from the past 24 hour(s)).  Assessment and Plan :  1. Type 2 diabetes mellitus with hyperglycemia, without long-term current use of insulin (HCC) ***   Benjiman CoreBrittany Ka Flammer, PA-C  [facility] 06/28/2018 9:52 PM

## 2018-06-29 ENCOUNTER — Ambulatory Visit: Payer: Medicare Other | Admitting: Physician Assistant

## 2018-07-02 NOTE — Progress Notes (Signed)
I called phone number [phone] and a gentleman stated that I have the wrong number and that he gets a lot of calls asking to speak to Dow ChemicalDonald Huang.

## 2018-09-16 ENCOUNTER — Other Ambulatory Visit: Payer: Self-pay | Admitting: Physician Assistant

## 2018-09-16 DIAGNOSIS — E1165 Type 2 diabetes mellitus with hyperglycemia: Secondary | ICD-10-CM

## 2018-09-17 NOTE — Telephone Encounter (Signed)
Requested Prescriptions  Pending Prescriptions Disp Refills  . TRULICITY 0.75 MG/0.5ML SOPN [Pharmacy Med Name: TRULICITY 0.75/0.5  INJ]  2    Sig: INJECT 0.75MG  INTO THE SKIN ONCE A WEEK     Endocrinology:  Diabetes - GLP-1 Receptor Agonists Failed - 09/16/2018  5:12 PM      Failed - HBA1C is between 0 and 7.9 and within 180 days    Hemoglobin A1C  Date Value Ref Range Status  06/24/2018 13.6 (A) 4.0 - 5.6 % Final   Hgb A1c MFr Bld  Date Value Ref Range Status  06/21/2017 9.8 (H) 4.8 - 5.6 % Final    Comment:             Pre-diabetes: 5.7 - 6.4          Diabetes: >6.4          Glycemic control for adults with diabetes: <7.0          Failed - Valid encounter within last 6 months    Recent Outpatient Visits          2 months ago Type 2 diabetes mellitus with hyperglycemia, without long-term current use of insulin (HCC)   Primary Care at [place], [place] D, PA-C   1 year ago Uncontrolled type 2 diabetes mellitus with diabetic polyneuropathy, without long-term current use of insulin (HCC)   Primary Care at [place], [place] D, [place]   1 year ago Gastroesophageal reflux disease without esophagitis   [facility] at [place] [facility], Marolyn Hammock, PA-C   2 years ago Uncontrolled type 2 diabetes mellitus with hyperglycemia, without long-term current use of insulin [facility])   Primary Care at [place], [place] D, [place]   2 years ago Uncontrolled type 2 diabetes mellitus with hyperglycemia, without long-term current use of insulin [facility])   Primary Care at [place], [place] D, [place]

## 2018-10-18 ENCOUNTER — Other Ambulatory Visit: Payer: Self-pay | Admitting: Physician Assistant

## 2018-10-18 DIAGNOSIS — E1165 Type 2 diabetes mellitus with hyperglycemia: Secondary | ICD-10-CM

## 2018-10-21 NOTE — Telephone Encounter (Signed)
Requested medication (s) are due for refill today: Yes  Requested medication (s) are on the active medication list: Yes  Last refill:  06/24/18  Future visit scheduled: No  Notes to clinic:  Unable to reach patient, no numbers on file, wrong number on DPR.     Requested Prescriptions  Pending Prescriptions Disp Refills   metFORMIN (GLUCOPHAGE) 1000 MG tablet [Pharmacy Med Name: METFORMIN 1000MG TAB] 180 tablet 0    Sig: TAKE 1 TABLET BY MOUTH TWICE DAILY WITH A MEAL     Endocrinology:  Diabetes - Biguanides Failed - 10/21/2018  9:47 AM      Failed - HBA1C is between 0 and 7.9 and within 180 days    Hemoglobin A1C  Date Value Ref Range Status  06/24/2018 13.6 (A) 4.0 - 5.6 % Final   Hgb A1c MFr Bld  Date Value Ref Range Status  06/21/2017 9.8 (H) 4.8 - 5.6 % Final    Comment:             Pre-diabetes: 5.7 - 6.4          Diabetes: >6.4          Glycemic control for adults with diabetes: <7.0          Failed - Valid encounter within last 6 months    Recent Outpatient Visits          3 months ago Type 2 diabetes mellitus with hyperglycemia, without long-term current use of insulin ([place])   Primary Care at [place], [place] D, PA-C   1 year ago Uncontrolled type 2 diabetes mellitus with diabetic polyneuropathy, without long-term current use of insulin ([place])   Primary Care at [place], [place] D, [place]   1 year ago Gastroesophageal reflux disease without esophagitis   [facility] at [place], Audrie Lia, PA-C   2 years ago Uncontrolled type 2 diabetes mellitus with hyperglycemia, without long-term current use of insulin ([place])   Primary Care at [place], [place] D, [place]   3 years ago Uncontrolled type 2 diabetes mellitus with hyperglycemia, without long-term current use of insulin ([place])   Primary Care at [place], [place] D, [place]             Passed - Cr in normal range and within 360 days    Creat  Date Value Ref Range Status  09/20/2015 0.98  0.70 - 1.33 mg/dL Final   Creatinine, Ser  Date Value Ref Range Status  06/24/2018 0.87 0.76 - 1.27 mg/dL Final         Passed - eGFR in normal range and within 360 days    GFR, Est African American  Date Value Ref Range Status  09/20/2015 >89 >=60 mL/min Final   GFR calc Af Amer  Date Value Ref Range Status  06/24/2018 111 >59 mL/min/1.73 Final   GFR, Est Non African American  Date Value Ref Range Status  09/20/2015 87 >=60 mL/min Final    Comment:      The estimated GFR is a calculation valid for adults (>=58 years old) that uses the CKD-EPI algorithm to adjust for age and sex. It is   not to be used for children, pregnant women, hospitalized patients,    patients on dialysis, or with rapidly changing kidney function. According to the NKDEP, eGFR >89 is normal, 60-89 shows mild impairment, 30-59 shows moderate impairment, 15-29 shows severe impairment and <15 is ESRD.      GFR calc non Af Wyvonnia Lora  Date Value Ref Range Status  06/24/2018 96 >59 mL/min/1.73 Final

## 2018-10-21 NOTE — Telephone Encounter (Signed)
Rx denied. There is no telephone number listed to contact pt to see if he has seen an endocrinologist and whether or not if they are filling pt's medication. pls advise if he calls back. Thanks.

## 2018-10-21 NOTE — Telephone Encounter (Addendum)
There is no contact information on file to call the patient to discuss his need for an appointment. Called number listed on DPR and the person who answered said he is not Dorinda Hillonald and he receives a lot of calls for this person, that he switched his number to this number, which is listed on the DPR.

## 2018-10-23 ENCOUNTER — Other Ambulatory Visit: Payer: Self-pay | Admitting: *Deleted

## 2018-10-23 DIAGNOSIS — F411 Generalized anxiety disorder: Secondary | ICD-10-CM

## 2018-10-23 DIAGNOSIS — E1165 Type 2 diabetes mellitus with hyperglycemia: Secondary | ICD-10-CM

## 2018-10-23 MED ORDER — METFORMIN HCL 1000 MG PO TABS: 1000.0000 mg | ORAL_TABLET | Freq: Two times a day (BID) | ORAL | 0 refills | Status: DC

## 2018-12-19 ENCOUNTER — Other Ambulatory Visit: Payer: Self-pay | Admitting: Physician Assistant

## 2018-12-19 DIAGNOSIS — E1165 Type 2 diabetes mellitus with hyperglycemia: Secondary | ICD-10-CM

## 2018-12-19 NOTE — Telephone Encounter (Signed)
Requested medication (s) are due for refill today: yes  Requested medication (s) are on the active medication list: yes    Last refill: 09/17/18 4pens 2 refills  Future visit scheduled no  Notes to clinic:unable to reach patient; no listed number.  Requested Prescriptions  Pending Prescriptions Disp Refills   TRULICITY 0.75 MG/0.5ML SOPN [Pharmacy Med Name: Trulicity 0.75 MG/0.5ML Subcutaneous Solution Pen-injector] 4 mL 0    Sig: INJECT 0.75MG  INTO THE SKIN ONCE A WEEK     Endocrinology:  Diabetes - GLP-1 Receptor Agonists Failed - 12/19/2018  9:23 AM      Failed - HBA1C is between 0 and 7.9 and within 180 days    Hemoglobin A1C  Date Value Ref Range Status  06/24/2018 13.6 (A) 4.0 - 5.6 % Final   Hgb A1c MFr Bld  Date Value Ref Range Status  06/21/2017 9.8 (H) 4.8 - 5.6 % Final    Comment:             Pre-diabetes: 5.7 - 6.4          Diabetes: >6.4          Glycemic control for adults with diabetes: <7.0          Failed - Valid encounter within last 6 months    Recent Outpatient Visits          5 months ago Type 2 diabetes mellitus with hyperglycemia, without long-term current use of insulin (HCC)   Primary Care at [place], [place] D, PA-C   1 year ago Uncontrolled type 2 diabetes mellitus with diabetic polyneuropathy, without long-term current use of insulin (HCC)   Primary Care at [place], [place] D, [place]   1 year ago Gastroesophageal reflux disease without esophagitis   [facility] at [facility], Marolyn Hammock, PA-C   2 years ago Uncontrolled type 2 diabetes mellitus with hyperglycemia, without long-term current use of insulin [facility])   Primary Care at [place], [place] D, [place]   3 years ago Uncontrolled type 2 diabetes mellitus with hyperglycemia, without long-term current use of insulin [facility])   Primary Care at [place], [place] D, [place]

## 2018-12-24 NOTE — Telephone Encounter (Signed)
LVM for pt to call the office and schedule an OV before Trulicity can be filled. When pt calls back, please schedule an OV - Establish Care/med refills. Pt will need to schedule with either Drs. Chyrel Masson or Sagardia to establish care. Thank you!

## 2019-02-18 ENCOUNTER — Other Ambulatory Visit: Payer: Self-pay | Admitting: Physician Assistant

## 2019-02-18 DIAGNOSIS — E1165 Type 2 diabetes mellitus with hyperglycemia: Secondary | ICD-10-CM

## 2019-02-19 ENCOUNTER — Telehealth: Payer: Self-pay | Admitting: Emergency Medicine

## 2019-02-19 NOTE — Telephone Encounter (Signed)
Copied from CRM [id number]. Topic: Quick Communication - Rx Refill/Question >> Feb 19, 2019  4:26 PM Tamela Oddi wrote: Medication: ALPRAZolam (XANAX) 0.5 MG tablet / amphetamine-dextroamphetamine (ADDERALL) 15 MG tablet / TRULICITY 0.75 MG/0.5ML SOPN  Patient called to request a refill for the above medications  Preferred Pharmacy (with phone number or street name): [facility] [address] [place] - [address] [phone] (Phone) [phone] (Fax)

## 2019-02-21 NOTE — Telephone Encounter (Signed)
Spoke with pharmacy and got his number on file which was [phone] and it just kept ringing for couldn't leave a voicemail. However, patient needs to est care with a provider to get his medications filled due to no being seen in awhile and seeing [place] and [place]. He can do a tele med or webex.

## 2019-02-26 ENCOUNTER — Telehealth: Payer: Self-pay | Admitting: Family Medicine

## 2019-02-26 NOTE — Telephone Encounter (Signed)
Pt has an app on 5/4 with Dr. Leretha Pol for a TOC. He would like a curtsy refill on his TRULICITY 0.75 MG/0.5ML SOPN [[id number]] Pharmacy:  [facility] [address], [place] - [address] Please advise at [phone]. He would also like a refill on his amphetamine-dextroamphetamine (ADDERALL) 15 MG tablet [[id number]] and ALPRAZolam (XANAX) 0.5 MG tablet [[id number]]. I let him know this probably is not going to happen. He was very insistent.

## 2019-03-03 ENCOUNTER — Ambulatory Visit: Payer: Self-pay

## 2019-03-03 NOTE — Telephone Encounter (Signed)
This patient called several times today requesting refill on Trulicity 0.75 MG/0.5ML. His LOV was 06/14/18 with [place]. He has a scheduled virtual visit to establish with Dr. Leretha Pol on 5/4. He needs a much sooner, like tomorrow (Tuesday) visit if possible so he can resume taking his diabetes medication. He may be reached at [phone]. Routing to the PCP for contact to schedule patient for tomorrow, possible. He can be available by by smart phone.

## 2019-03-03 NOTE — Telephone Encounter (Signed)
Pt was calling back and hung up before call was transferred. See earlier triage note from today.

## 2019-03-03 NOTE — Telephone Encounter (Signed)
Pt called and hung up before call was transferred to NT. Called pt and left message to call back.

## 2019-03-03 NOTE — Telephone Encounter (Signed)
Spoke with office, pt has upcoming appt on 5/04. Pt is asking for this to be filled.  Pt says he can not wait much longer, pt doesn't know his blood sugar, he has not checked it.  Please call back

## 2019-03-03 NOTE — Telephone Encounter (Signed)
      [name] Male, 58 y.o., 06-11-1961 MRN:  [id number] Phone:  [phone] Judie Petit) PCP:  None Primary Cvg:  MEDICARE/MEDICARE PART A AND B Next Appt With Family Medicine 03/17/2019 at 4:20 PM Message from Barbee Cough [facility] sent at 03/03/2019 4:24 PM EDT   Summary: dizziness, symptoms   Pt has run out of trulicity. Refill request was sent in on 04/15. Pt says he has neuropathy in feet, and can not feel them. has not checked his blood sugar, feels dizzy, pt says he is very sick and it has taken him all day to make phone call, that he can not get anything done w/o sitting a resting. Spoke to office, they had me re-send the refill request.          Call History    Type Contact Phone  03/03/2019 04:20 PM Phone (Incoming) Walker, Daniel (Self) [phone] (H)  User: Jay Schlichter

## 2019-03-03 NOTE — Telephone Encounter (Signed)
Message from Jay SchlichterMelissa J Weikart sent at 03/03/2019 4:24 PM EDT   Summary: dizziness, symptoms   Pt has run out of trulicity. Refill request was sent in on 04/15. Pt says he has neuropathy in feet, and can not feel them. has not checked his blood sugar, feels dizzy, pt says he is very sick and it has taken him all day to make phone call, that he can not get anything done w/o sitting a resting. Spoke to office, they had me re-send the refill request.           Returned call to patient who currently has complaints of dizziness for the past week and worsening neuropathy in is feet.Pt states he has been feeling bad and feels erratic and has trouble concentrating. Pt also states he does not know if dizziness could be from his BP but states he does not have a way to check his BP at home but has been taking BP medication.Pt has called muliple times requesting refill of Trulicity(see previous telephone encounters) and states the last dose of this medication was taken on 4/3/20Pt states that he has not checked his blood glucose in a while and while on the phone pt attempted to find glucometer but was unable to locate it at the time of call. Pt states he stopped taking his blood glucose twice a day because it was hurting his fingers. Unable to state specifically when the last time blood glucose was taken. Pt states that the numbness and neuropathy had worsened in his feet where it used to be just in his toes. Pt also states that he has noticed that gets up to urinate 3-4 times a night and states that he sometimes experiences pain on the right side of his chest that goes to the left arm as well. Pt denies chest pain, nausea, vomiting, fever, or diarrhea at the time of triage call. Pt states he has been eating and today right before speaking with a triage nurse he states he ate a sweet potato but states it is hard to maintain diabetic diet due to current budget and not being able to afford food that is not high in  sugar or carbohydrates. Pt states that his meals are often spaghetti because that is what he can afford. Educated the pt on the importance of checking blood glucose daily especially when taking medications to control blood glucose. Also educated pt on the importance of eating foods that are good for a diabetic and low in sugar and carbohydrates. Pt advised that if he had any worsening symptoms during the night to call 911 to seek treatment in the ED. Pt verbalized understanding. Establish care appt with Dr. Leretha PolSantiago on 03/17/19 but pt will need sooner appt if available for current symptoms. LOV with Benjiman CoreBrittany [name] on 06/24/18. Pt can be contacted at  [phone]. Pt notified that he should be contacted within 2 hours of the office opening. Email address updated in chart.  Reason for Disposition . [1] Symptoms of high blood sugar (e.g., frequent urination, weak, weight loss) AND [2] not able to test blood glucose . Taking a medicine that could cause dizziness (e.g., blood pressure medications, diuretics)  Answer Assessment - Initial Assessment Questions 1. BLOOD GLUCOSE: "What is your blood glucose level?"      Unknown has not checked blood glucose level in a while 2. ONSET: "When did you check the blood glucose?"     n/a 3. USUAL RANGE: "What is your glucose level usually?" (e.g., usual  fasting morning value, usual evening value)     n/a 4. KETONES: "Do you check for ketones (urine or blood test strips)?" If yes, ask: "What does the test show now?"      n/a 5. TYPE 1 or 2:  "Do you know what type of diabetes you have?"  (e.g., Type 1, Type 2, Gestational; doesn't know)      Type II 6. INSULIN: "Do you take insulin?" "What type of insulin(s) do you use? What is the mode of delivery? (syringe, pen (e.g., injection or  pump)?"      Pt takes Trulicity injection weekly, last dose was on 02/14/19 7. DIABETES PILLS: "Do you take any pills for your diabetes?" If yes, ask: "Have you missed taking any pills  recently?"     Not at this time 8. OTHER SYMPTOMS: "Do you have any symptoms?" (e.g., fever, frequent urination, difficulty breathing, dizziness, weakness, vomiting)     Frequent urination and dizziness 9. PREGNANCY: "Is there any chance you are pregnant?" "When was your last menstrual period?"     n/a  Answer Assessment - Initial Assessment Questions 1. DESCRIPTION: "Describe your dizziness."     Pt states he has been without dizziness for a while but it has worsened in the past week 2. LIGHTHEADED: "Do you feel lightheaded?" (e.g., somewhat faint, woozy, weak upon standing)   Pt states sometimes with waling he feels like he is going to fall over 3. VERTIGO: "Do you feel like either you or the room is spinning or tilting?" (i.e. vertigo)     Denies at his time 4. SEVERITY: "How bad is it?"  "Do you feel like you are going to faint?" "Can you stand and walk?"   - MILD - walking normally   - MODERATE - interferes with normal activities (e.g., work, school)    - SEVERE - unable to stand, requires support to walk, feels like passing out now.      At the time of the call the pt states he is lying down but states that sometimes with walking he felt as if he was going to fall over 5. ONSET:  "When did the dizziness begin?"     A week ago 6. AGGRAVATING FACTORS: "Does anything make it worse?" (e.g., standing, change in head position)   Standing and waling 7. HEART RATE: "Can you tell me your heart rate?" "How many beats in 15 seconds?"  (Note: not all patients can do this)       Pt does not have a home BP monitor 8. CAUSE: "What do you think is causing the dizziness?"     Unsure but could be from not taking diabetic medication or from blood glucose 9. RECURRENT SYMPTOM: "Have you had dizziness before?" If so, ask: "When was the last time?" "What happened that time?"     Yes has been awhile 10. OTHER SYMPTOMS: "Do you have any other symptoms?" (e.g., fever, chest pain, vomiting, diarrhea,  bleeding)       No 11. PREGNANCY: "Is there any chance you are pregnant?" "When was your last menstrual period?"       n/a  Protocols used: DIABETES - HIGH BLOOD SUGAR-A-AH, DIZZINESS - [facility]

## 2019-03-03 NOTE — Telephone Encounter (Signed)
This encounter was created in error - please disregard.

## 2019-03-03 NOTE — Telephone Encounter (Signed)
If courtesy refill of Trulicity given he would like for the medication to be sent to [facility] on [address].

## 2019-03-04 ENCOUNTER — Other Ambulatory Visit: Payer: Self-pay

## 2019-03-04 ENCOUNTER — Telehealth: Payer: Self-pay

## 2019-03-04 DIAGNOSIS — E1165 Type 2 diabetes mellitus with hyperglycemia: Secondary | ICD-10-CM

## 2019-03-04 MED ORDER — DULAGLUTIDE 0.75 MG/0.5ML ~~LOC~~ SOAJ: SUBCUTANEOUS | 2 refills | Status: DC

## 2019-03-04 NOTE — Telephone Encounter (Signed)
Pt is wanting his xanax and adderall. He has a appt with you on 5/4. He is asking that you give him enough meds to get to his appt. He is a Conservation officer, nature from [employer]

## 2019-03-05 NOTE — Telephone Encounter (Signed)
Patient has 05/04 OV for TOC with [place] but would like to schedule sooner appt if available.  Please contact pt to arrange appt.

## 2019-03-05 NOTE — Telephone Encounter (Signed)
Please let patient know that I am not going to refill his adderall or xanax as I do not treat ADD.  I am happy to treat his other chronic medical conditions such as diabetes and HTN.  I am happy to make a referral to psychiatry for further evaluation and treatment of his ADD and anxiety.  Thank you

## 2019-03-05 NOTE — Telephone Encounter (Signed)
Pmp reviewed He has not been on adderall since 2018 Last xanax use in feb 2019  meds declined

## 2019-03-06 NOTE — Telephone Encounter (Signed)
Attempted to c/b to pt re req for Adderall and Xanax.  L/m for pt to c/b.  When pt c/b please advise that we will not be refilling that medication through this clinic but would be happy to submit a referral to psych for specialized treatment and medication for ADD and anxiety.    Please let us know if you would like a referral.

## 2019-03-17 ENCOUNTER — Telehealth: Payer: Medicare Other | Admitting: Family Medicine

## 2019-03-27 ENCOUNTER — Other Ambulatory Visit: Payer: Self-pay

## 2019-03-27 ENCOUNTER — Telehealth (INDEPENDENT_AMBULATORY_CARE_PROVIDER_SITE_OTHER): Payer: Medicare Other | Admitting: Family Medicine

## 2019-03-27 DIAGNOSIS — E1142 Type 2 diabetes mellitus with diabetic polyneuropathy: Secondary | ICD-10-CM | POA: Diagnosis not present

## 2019-03-27 DIAGNOSIS — Z125 Encounter for screening for malignant neoplasm of prostate: Secondary | ICD-10-CM

## 2019-03-27 DIAGNOSIS — E1165 Type 2 diabetes mellitus with hyperglycemia: Secondary | ICD-10-CM | POA: Diagnosis not present

## 2019-03-27 DIAGNOSIS — K219 Gastro-esophageal reflux disease without esophagitis: Secondary | ICD-10-CM

## 2019-03-27 DIAGNOSIS — IMO0002 Reserved for concepts with insufficient information to code with codable children: Secondary | ICD-10-CM

## 2019-03-27 DIAGNOSIS — I10 Essential (primary) hypertension: Secondary | ICD-10-CM

## 2019-03-27 DIAGNOSIS — Z1211 Encounter for screening for malignant neoplasm of colon: Secondary | ICD-10-CM

## 2019-03-27 MED ORDER — LISINOPRIL 5 MG PO TABS: 2.5000 mg | ORAL_TABLET | Freq: Every day | ORAL | 0 refills | Status: DC

## 2019-03-27 MED ORDER — OMEPRAZOLE 40 MG PO CPDR: 40.0000 mg | DELAYED_RELEASE_CAPSULE | Freq: Every day | ORAL | 3 refills | Status: DC

## 2019-03-27 MED ORDER — DULAGLUTIDE 0.75 MG/0.5ML ~~LOC~~ SOAJ: SUBCUTANEOUS | 0 refills | Status: DC

## 2019-03-27 NOTE — Progress Notes (Signed)
Virtual Visit Note  I connected with patient on 03/27/19 at 347pm by phone and verified that I am speaking with the correct person using two identifiers. [name] is currently located at home and patient is currently with them during visit. The provider, Rutherford Guys, MD is located in their office at time of visit.  I discussed the limitations, risks, security and privacy concerns of performing an evaluation and management service by telephone and the availability of in person appointments. I also discussed with the patient that there may be a patient responsible charge related to this service. The patient expressed understanding and agreed to proceed.   CC: diabetes  HPI   Last OV Aug 2019 - [name], PA-C Started on trulicity at last OV Has been tolerating well Has been checking cbgs at home Ran out of medication for about a week, cbgs in 300s  Before he ran out of trulicity still in the [address] Tries to what he eats Very anxious  Lab Results  Component Value Date   HGBA1C 13.6 (A) 06/24/2018   HGBA1C 9.8 (H) 06/21/2017   HGBA1C 11.1 (H) 09/20/2015   Lab Results  Component Value Date   MICROALBUR 3.28 (H) 04/03/2013   LDLCALC 130 (H) 06/24/2018   CREATININE 0.87 06/24/2018   No Known Allergies  Prior to Admission medications   Medication Sig Start Date End Date Taking? Authorizing Provider  ALPRAZolam (XANAX) 0.5 MG tablet TAKE 1/2 TO 1 (ONE-HALF TO ONE) TABLET BY MOUTH AT BEDTIME AS NEEDED FOR ANXIETY OR SLEEP 12/05/17   English, Colletta [place] D, PA  amphetamine-dextroamphetamine (ADDERALL) 15 MG tablet Take 0.5-1 tablets by mouth daily. 06/21/17   [name] D, PA  aspirin 81 MG tablet Take 1 tablet (81 mg total) by mouth daily. 04/22/15   Jaynee Eagles, PA-C  Dulaglutide (TRULICITY) 0.71 QR/9.7JO SOPN INJECT 0.75MG INTO THE SKIN ONCE A WEEK 03/04/19   Rutherford Guys, MD  glucose blood test strip Test blood sugar daily. Dx code: 250.60. 08/09/16   [name] D,  PA  Lancets MISC Test blood sugar daily. Dx code 250.60 08/09/16   [name] D, PA  lisinopril (PRINIVIL,ZESTRIL) 5 MG tablet Take 0.5 tablets (2.5 mg total) by mouth daily. 06/24/18   Tenna Delaine D, PA-C  omeprazole (PRILOSEC) 40 MG capsule Take 1 capsule (40 mg total) by mouth daily. 06/24/18   Tenna Delaine D, PA-C  Omeprazole (PRILOSEC PO) Take 1 tablet by mouth every other day.  03/21/12  [provider]    Past Medical History:  Diagnosis Date  . Anxiety   . Arthritis   . Diabetes mellitus     Past Surgical History:  Procedure Laterality Date  . VARICOSE VEIN SURGERY     bilateral    Social History   Tobacco Use  . Smoking status: Former Smoker    Types: Cigars    Last attempt to quit: 01/09/2017    Years since quitting: 2.2  . Smokeless tobacco: Never Used  Substance Use Topics  . Alcohol use: No    No family history on file.  ROS Per hpi  Objective  Vitals as reported by the patient: none   ASSESSMENT and PLAN  1. Uncontrolled type 2 diabetes mellitus with diabetic polyneuropathy, without long-term current use of insulin ([place]) Trulicity refilled for one month.  - Lipid panel; Future - TSH; Future - Microalbumin / creatinine urine ratio; Future - CMP14+EGFR; Future - lisinopril (ZESTRIL) 5 MG tablet; Take 0.5 tablets (2.5  mg total) by mouth daily. - Dulaglutide (TRULICITY) 7.07 EM/7.5QG SOPN; INJECT 0.75MG INTO THE SKIN ONCE A WEEK - POCT A1C; Future - POCT urinalysis dipstick; Future  2. Essential hypertension - Lipid panel; Future - TSH; Future - CMP14+EGFR; Future  3. Special screening for malignant neoplasms, colon - Cologuard; Future  4. Screening for prostate cancer  5. Gastroesophageal reflux disease without esophagitis - omeprazole (PRILOSEC) 40 MG capsule; Take 1 capsule (40 mg total) by mouth daily.  FOLLOW-UP: 1 week   The above assessment and management plan was discussed with the patient. The patient  verbalized understanding of and has agreed to the management plan. Patient is aware to call the clinic if symptoms persist or worsen. Patient is aware when to return to the clinic for a follow-up visit. Patient educated on when it is appropriate to go to the emergency department.    I provided 20 minutes of non-face-to-face time during this encounter.  Rutherford Guys, MD Primary Care at [facility] [place], [place] [address] Ph.  [phone] Fax [phone]

## 2019-03-27 NOTE — Progress Notes (Signed)
Pt is needing refills on pended medication. Wants to know if he were to ever run out of his trulicity, can he take some of the metformin pills that he has left over? Asking just in case his medication is not refilled in time. Does not wish to come into the office at this time. Labs are pended for future draw

## 2019-03-28 ENCOUNTER — Telehealth: Payer: Self-pay | Admitting: Family Medicine

## 2019-03-28 ENCOUNTER — Ambulatory Visit: Payer: Medicare Other | Admitting: Family Medicine

## 2019-03-28 NOTE — Telephone Encounter (Signed)
03/28/2019 - PATIENT HAD A TELEMED VISIT WITH DR. Darcel Bayley ON THURS. (03/27/2019). SHE HAS REQUESTED HE COME INTO THE OFFICE FOR A FOLLOW-UP APPOINTMENT IN 1 WEEK. NIKKI SAID IT IS ALRIGHT TO DOUBLE BOOK DR. IRMA. I TRIED TO CALL AND SCHEDULE BUT HAD TO LEAVE A VOICE MAIL TO RETURN OUR CALL. MBC

## 2019-04-03 ENCOUNTER — Ambulatory Visit: Payer: Medicare Other | Admitting: Family Medicine

## 2019-04-09 ENCOUNTER — Ambulatory Visit: Payer: Medicare Other | Admitting: Family Medicine

## 2019-06-27 ENCOUNTER — Other Ambulatory Visit: Payer: Self-pay | Admitting: Family Medicine

## 2019-06-27 DIAGNOSIS — E1142 Type 2 diabetes mellitus with diabetic polyneuropathy: Secondary | ICD-10-CM

## 2019-06-27 DIAGNOSIS — IMO0002 Reserved for concepts with insufficient information to code with codable children: Secondary | ICD-10-CM

## 2019-06-27 NOTE — Telephone Encounter (Signed)
Review for refill of Trulicity 8.72 BM/1.8MQ   Last refill 03/27/2019  LOV was 06/24/18  Need office visit  Last virtual visit was 03/27/2019  Last labs for HBA1C 06/24/18. Dr. Pamella Pert

## 2019-08-16 ENCOUNTER — Other Ambulatory Visit: Payer: Self-pay | Admitting: Family Medicine

## 2019-08-16 DIAGNOSIS — IMO0002 Reserved for concepts with insufficient information to code with codable children: Secondary | ICD-10-CM

## 2019-08-16 DIAGNOSIS — E1142 Type 2 diabetes mellitus with diabetic polyneuropathy: Secondary | ICD-10-CM

## 2019-08-16 NOTE — Telephone Encounter (Signed)
Requested medication (s) are due for refill today: Yes  Requested medication (s) are on the active medication list: Yes  Last refill:  03/2019  Future visit scheduled: No  Notes to clinic:  Failed labs, unable to refill     Requested Prescriptions  Pending Prescriptions Disp Refills   TRULICITY 8.29 HB/7.1IR SOPN [Pharmacy Med Name: Trulicity 6.78 LF/8.1OF Subcutaneous Solution Pen-injector] 4 mL 0    Sig: INJECT 0.75 MG INTO SKIN ONCE A WEEK     Endocrinology:  Diabetes - GLP-1 Receptor Agonists Failed - 08/16/2019  6:53 PM      Failed - HBA1C is between 0 and 7.9 and within 180 days    Hemoglobin A1C  Date Value Ref Range Status  06/24/2018 13.6 (A) 4.0 - 5.6 % Final   Hgb A1c MFr Bld  Date Value Ref Range Status  06/21/2017 9.8 (H) 4.8 - 5.6 % Final    Comment:             Pre-diabetes: 5.7 - 6.4          Diabetes: >6.4          Glycemic control for adults with diabetes: <7.0          Passed - Valid encounter within last 6 months    Recent Outpatient Visits          4 months ago Uncontrolled type 2 diabetes mellitus with diabetic polyneuropathy, without long-term current use of insulin ([place])   [facility], Lilia Argue, MD   1 year ago Type 2 diabetes mellitus with hyperglycemia, without long-term current use of insulin [facility])   Primary Care at [place], [place] D, PA-C   2 years ago Uncontrolled type 2 diabetes mellitus with diabetic polyneuropathy, without long-term current use of insulin ([place])   Primary Care at [place], [place] D, [place]   2 years ago Gastroesophageal reflux disease without esophagitis   Primary Care at [place], PA-C   3 years ago Uncontrolled type 2 diabetes mellitus with hyperglycemia, without long-term current use of insulin [facility])   Primary Care at [place], [place] D, [place]

## 2019-10-03 ENCOUNTER — Other Ambulatory Visit: Payer: Self-pay | Admitting: Family Medicine

## 2019-10-03 DIAGNOSIS — IMO0002 Reserved for concepts with insufficient information to code with codable children: Secondary | ICD-10-CM

## 2019-10-03 DIAGNOSIS — E1142 Type 2 diabetes mellitus with diabetic polyneuropathy: Secondary | ICD-10-CM

## 2019-10-03 NOTE — Telephone Encounter (Signed)
Forwarding medication refill request to the clinical pool for review. 

## 2019-10-29 ENCOUNTER — Other Ambulatory Visit: Payer: Self-pay | Admitting: Family Medicine

## 2019-10-29 DIAGNOSIS — IMO0002 Reserved for concepts with insufficient information to code with codable children: Secondary | ICD-10-CM

## 2019-10-29 DIAGNOSIS — E1142 Type 2 diabetes mellitus with diabetic polyneuropathy: Secondary | ICD-10-CM

## 2019-10-29 NOTE — Telephone Encounter (Signed)
Requested medication (s) are due for refill today: yes  Requested medication (s) are on the active medication list: yes  Last refill:  10/06/2019  Future visit scheduled: no  Notes to clinic:  needs OV and Labs    Requested Prescriptions  Pending Prescriptions Disp Refills   TRULICITY 3.41 PF/7.9KW SOPN [Pharmacy Med Name: Trulicity 4.09 BD/5.3GD Subcutaneous Solution Pen-injector] 4 mL 0    Sig: INJECT 0.75 MG INTO SKIN ONCE A WEEK      Endocrinology:  Diabetes - GLP-1 Receptor Agonists Failed - 10/29/2019  4:40 PM      Failed - HBA1C is between 0 and 7.9 and within 180 days    Hemoglobin A1C  Date Value Ref Range Status  06/24/2018 13.6 (A) 4.0 - 5.6 % Final   Hgb A1c MFr Bld  Date Value Ref Range Status  06/21/2017 9.8 (H) 4.8 - 5.6 % Final    Comment:             Pre-diabetes: 5.7 - 6.4          Diabetes: >6.4          Glycemic control for adults with diabetes: <7.0           Failed - Valid encounter within last 6 months    Recent Outpatient Visits           7 months ago Uncontrolled type 2 diabetes mellitus with diabetic polyneuropathy, without long-term current use of insulin ([place])   [facility], Lilia Argue, MD   1 year ago Type 2 diabetes mellitus with hyperglycemia, without long-term current use of insulin [facility])   Primary Care at [place], [place] D, PA-C   2 years ago Uncontrolled type 2 diabetes mellitus with diabetic polyneuropathy, without long-term current use of insulin ([place])   Primary Care at [place], [place], [place]   2 years ago Gastroesophageal reflux disease without esophagitis   Primary Care at [place], PA-C   3 years ago Uncontrolled type 2 diabetes mellitus with hyperglycemia, without long-term current use of insulin [facility])   Primary Care at [place], [place], [place]

## 2019-12-05 ENCOUNTER — Other Ambulatory Visit: Payer: Self-pay | Admitting: Family Medicine

## 2019-12-05 DIAGNOSIS — E1142 Type 2 diabetes mellitus with diabetic polyneuropathy: Secondary | ICD-10-CM

## 2019-12-05 DIAGNOSIS — IMO0002 Reserved for concepts with insufficient information to code with codable children: Secondary | ICD-10-CM

## 2019-12-05 NOTE — Telephone Encounter (Signed)
Requested medication (s) are due for refill today:   Yes  Requested medication (s) are on the active medication list:   Yes  Future visit scheduled:   No   Last ordered: 10/29/2019  4 ml  0 refills.    Failed protocol     Requested Prescriptions  Pending Prescriptions Disp Refills   TRULICITY 0.75 MG/0.5ML SOPN [Pharmacy Med Name: Trulicity 0.75 MG/0.5ML Subcutaneous Solution Pen-injector] 4 mL 0    Sig: INJECT5 0.75MG  INTO THE SKIN ONCE A WEEK      Endocrinology:  Diabetes - GLP-1 Receptor Agonists Failed - 12/05/2019 11:49 AM      Failed - HBA1C is between 0 and 7.9 and within 180 days    Hemoglobin A1C  Date Value Ref Range Status  06/24/2018 13.6 (A) 4.0 - 5.6 % Final   Hgb A1c MFr Bld  Date Value Ref Range Status  06/21/2017 9.8 (H) 4.8 - 5.6 % Final    Comment:             Pre-diabetes: 5.7 - 6.4          Diabetes: >6.4          Glycemic control for adults with diabetes: <7.0           Failed - Valid encounter within last 6 months    Recent Outpatient Visits           8 months ago Uncontrolled type 2 diabetes mellitus with diabetic polyneuropathy, without long-term current use of insulin ([facility])   [facility], Meda Coffee, MD   1 year ago Type 2 diabetes mellitus with hyperglycemia, without long-term current use of insulin [facility])   Primary Care at [place], [place] D, PA-C   2 years ago Uncontrolled type 2 diabetes mellitus with diabetic polyneuropathy, without long-term current use of insulin ([facility])   Primary Care at [place], [place] D, [place]   2 years ago Gastroesophageal reflux disease without esophagitis   [facility], Marolyn Hammock, PA-C   3 years ago Uncontrolled type 2 diabetes mellitus with hyperglycemia, without long-term current use of insulin [facility])   Primary Care at [place], [place] D, [place]

## 2019-12-09 ENCOUNTER — Telehealth: Payer: Self-pay | Admitting: *Deleted

## 2019-12-09 NOTE — Telephone Encounter (Signed)
Schedule AWV.  

## 2020-01-07 ENCOUNTER — Other Ambulatory Visit: Payer: Self-pay | Admitting: Family Medicine

## 2020-01-07 DIAGNOSIS — IMO0002 Reserved for concepts with insufficient information to code with codable children: Secondary | ICD-10-CM

## 2020-01-07 DIAGNOSIS — E1142 Type 2 diabetes mellitus with diabetic polyneuropathy: Secondary | ICD-10-CM

## 2020-01-07 NOTE — Telephone Encounter (Signed)
I have attempted to call pt to schedule appointment. He has not been seen since May 2020.   Please advise if it is okay to refill TRULICITY, med pended

## 2020-01-07 NOTE — Telephone Encounter (Signed)
Requested medication (s) are due for refill today:   Yes  Requested medication (s) are on the active medication list:   Yes  Future visit scheduled:   No   Last ordered: 12/05/2019  4 ml  0 refills  Clinic note:  Over due an OV.   HBA1C due     Requested Prescriptions  Pending Prescriptions Disp Refills   TRULICITY 0.75 MG/0.5ML SOPN [Pharmacy Med Name: Trulicity 0.75 MG/0.5ML Subcutaneous Solution Pen-injector] 4 mL 0    Sig: INJECT 0.75MG  INTO THE SKIN ONCE WEEKLY      Endocrinology:  Diabetes - GLP-1 Receptor Agonists Failed - 01/07/2020  2:15 PM      Failed - HBA1C is between 0 and 7.9 and within 180 days    Hemoglobin A1C  Date Value Ref Range Status  06/24/2018 13.6 (A) 4.0 - 5.6 % Final   Hgb A1c MFr Bld  Date Value Ref Range Status  06/21/2017 9.8 (H) 4.8 - 5.6 % Final    Comment:             Pre-diabetes: 5.7 - 6.4          Diabetes: >6.4          Glycemic control for adults with diabetes: <7.0           Failed - Valid encounter within last 6 months    Recent Outpatient Visits           9 months ago Uncontrolled type 2 diabetes mellitus with diabetic polyneuropathy, without long-term current use of insulin ([facility])   [facility], Meda Coffee, MD   1 year ago Type 2 diabetes mellitus with hyperglycemia, without long-term current use of insulin [facility])   Primary Care at [place], [place] D, PA-C   2 years ago Uncontrolled type 2 diabetes mellitus with diabetic polyneuropathy, without long-term current use of insulin ([facility])   Primary Care at [place], [place] D, [place]   2 years ago Gastroesophageal reflux disease without esophagitis   [facility], Marolyn Hammock, PA-C   3 years ago Uncontrolled type 2 diabetes mellitus with hyperglycemia, without long-term current use of insulin [facility])   Primary Care at [place], [place], [place]

## 2020-01-12 ENCOUNTER — Telehealth: Payer: Self-pay | Admitting: Family Medicine

## 2020-01-12 NOTE — Telephone Encounter (Signed)
Pt would like to get his TRULICITY 0.75 MG/0.5ML SOPN [[id number]]  proscription refilled but the pharmacy wont fill unless he has an appt and he would like to have a courtesy refill before hes appt on 01/22/20 Pt would like a call [phone] please advise.

## 2020-01-13 ENCOUNTER — Other Ambulatory Visit: Payer: Self-pay | Admitting: *Deleted

## 2020-01-13 DIAGNOSIS — E1142 Type 2 diabetes mellitus with diabetic polyneuropathy: Secondary | ICD-10-CM

## 2020-01-13 DIAGNOSIS — IMO0002 Reserved for concepts with insufficient information to code with codable children: Secondary | ICD-10-CM

## 2020-01-13 MED ORDER — TRULICITY 0.75 MG/0.5ML ~~LOC~~ SOAJ: SUBCUTANEOUS | 0 refills | Status: DC

## 2020-01-13 NOTE — Telephone Encounter (Signed)
Prescription sent Patient must keep appointment for additional refills

## 2020-01-22 ENCOUNTER — Encounter: Payer: Self-pay | Admitting: Family Medicine

## 2020-01-22 ENCOUNTER — Other Ambulatory Visit: Payer: Self-pay

## 2020-01-22 ENCOUNTER — Ambulatory Visit (INDEPENDENT_AMBULATORY_CARE_PROVIDER_SITE_OTHER): Payer: Medicare Other | Admitting: Family Medicine

## 2020-01-22 VITALS — BP 134/80 | HR 100 | Temp 98.3°F | Ht 72.44 in | Wt 214.2 lb

## 2020-01-22 DIAGNOSIS — E1142 Type 2 diabetes mellitus with diabetic polyneuropathy: Secondary | ICD-10-CM | POA: Diagnosis not present

## 2020-01-22 DIAGNOSIS — I10 Essential (primary) hypertension: Secondary | ICD-10-CM | POA: Diagnosis not present

## 2020-01-22 DIAGNOSIS — Z1211 Encounter for screening for malignant neoplasm of colon: Secondary | ICD-10-CM

## 2020-01-22 DIAGNOSIS — F411 Generalized anxiety disorder: Secondary | ICD-10-CM

## 2020-01-22 DIAGNOSIS — F988 Other specified behavioral and emotional disorders with onset usually occurring in childhood and adolescence: Secondary | ICD-10-CM

## 2020-01-22 DIAGNOSIS — E1165 Type 2 diabetes mellitus with hyperglycemia: Secondary | ICD-10-CM | POA: Diagnosis not present

## 2020-01-22 DIAGNOSIS — IMO0002 Reserved for concepts with insufficient information to code with codable children: Secondary | ICD-10-CM

## 2020-01-22 LAB — POCT GLYCOSYLATED HEMOGLOBIN (HGB A1C): Hemoglobin A1C: 12.5 % — AB (ref 4.0–5.6)

## 2020-01-22 MED ORDER — GABAPENTIN 100 MG PO CAPS: 100.0000 mg | ORAL_CAPSULE | Freq: Three times a day (TID) | ORAL | 3 refills | Status: DC

## 2020-01-22 MED ORDER — TRULICITY 0.75 MG/0.5ML ~~LOC~~ SOAJ: SUBCUTANEOUS | 5 refills | Status: DC

## 2020-01-22 MED ORDER — ALPRAZOLAM 0.5 MG PO TABS: ORAL_TABLET | ORAL | 0 refills | Status: DC

## 2020-01-22 MED ORDER — LISINOPRIL 5 MG PO TABS: 2.5000 mg | ORAL_TABLET | Freq: Every day | ORAL | 1 refills | Status: DC

## 2020-01-22 MED ORDER — AMPHETAMINE-DEXTROAMPHETAMINE 15 MG PO TABS: 7.5000 mg | ORAL_TABLET | Freq: Every day | ORAL | 0 refills | Status: DC

## 2020-01-22 MED ORDER — PEN NEEDLES 32G X 6 MM MISC: 1.0000 | Freq: Every day | 1 refills | Status: AC

## 2020-01-22 MED ORDER — BLOOD GLUCOSE MONITOR KIT: PACK | 11 refills | Status: AC

## 2020-01-22 MED ORDER — LANTUS SOLOSTAR 100 UNIT/ML ~~LOC~~ SOPN: 16.0000 [IU] | PEN_INJECTOR | Freq: Every day | SUBCUTANEOUS | 11 refills | Status: AC

## 2020-01-22 NOTE — Progress Notes (Signed)
3/11/20213:09 PM  [name] 10/10/1961, 59 y.o., male [id number]  Chief Complaint  Patient presents with  . Diabetes    says when he eats, he sometimes feels dizzy depending on what it is    HPI:   Patient is a 59 y.o. male with past medical history significant for DM2, ADD, GERD  who presents today for routine followup  Last OV May 2020 - telemedicine, no labs since 2019 On disability for "nerves, injured back at work, neuropathy"  Takes trulicity once a week, denies any issues meds tried per chart review, does not remember side effects Invokana, farxiga, glipizide, metformin, [place] Struggles with diet,  Reports at times balance issues when walking, numbness and tingling in his feet Requesting renewal of his disability placard He used to see a podiatrist, requesting referral Sees Dr Gershon Crane next week for DM eye exam Declines pneumonia vaccine today He plans on getting covid vaccine  Long standing issues with mental health, ADD Feels currently mood is stable Takes adderrall and xanax very prn pmp reviewed  Depression screen [facility] 2/9 01/22/2020 03/27/2019 03/27/2019  Decreased Interest 0 0 0  Down, Depressed, Hopeless 0 0 0  PHQ - 2 Score 0 0 0  Altered sleeping - - -  Tired, decreased energy - - -  Change in appetite - - -  Feeling bad or failure about yourself  - - -  Trouble concentrating - - -  Moving slowly or fidgety/restless - - -  Suicidal thoughts - - -  PHQ-9 Score - - -  Difficult doing work/chores - - -    Fall Risk  01/22/2020 03/27/2019 03/27/2019 06/24/2018 06/21/2017  Falls in the past year? 0 0 0 No No  Number falls in past yr: 0 0 0 - -  Injury with Fall? 0 0 0 - -     No Known Allergies  Prior to Admission medications   Medication Sig Start Date End Date Taking? Authorizing Provider  ALPRAZolam (XANAX) 0.5 MG tablet TAKE 1/2 TO 1 (ONE-HALF TO ONE) TABLET BY MOUTH AT BEDTIME AS NEEDED FOR ANXIETY OR SLEEP 12/05/17  Yes English, Stephanie D, PA    amphetamine-dextroamphetamine (ADDERALL) 15 MG tablet Take 0.5-1 tablets by mouth daily. 06/21/17  Yes [name] D, PA  aspirin 81 MG tablet Take 1 tablet (81 mg total) by mouth daily. 04/22/15  Yes Jaynee Eagles, PA-C  Dulaglutide (TRULICITY) 5.10 CH/8.5ID SOPN INJECT5 0.75MG INTO THE SKIN ONCE A WEEK 01/13/20  Yes Rutherford Guys, MD  glucose blood test strip Test blood sugar daily. Dx code: 250.60. 08/09/16  Yes [name] D, PA  Lancets MISC Test blood sugar daily. Dx code 250.60 08/09/16  Yes [name] D, PA  lisinopril (ZESTRIL) 5 MG tablet Take 0.5 tablets (2.5 mg total) by mouth daily. 03/27/19  Yes Rutherford Guys, MD  omeprazole (PRILOSEC) 40 MG capsule Take 1 capsule (40 mg total) by mouth daily. 03/27/19  Yes Rutherford Guys, MD  Omeprazole (PRILOSEC PO) Take 1 tablet by mouth every other day.  03/21/12  [provider]    Past Medical History:  Diagnosis Date  . Anxiety   . Arthritis   . Diabetes mellitus     Past Surgical History:  Procedure Laterality Date  . VARICOSE VEIN SURGERY     bilateral    Social History   Tobacco Use  . Smoking status: Former Smoker    Types: Cigars    Quit date: 01/09/2017  Years since quitting: 3.0  . Smokeless tobacco: Never Used  Substance Use Topics  . Alcohol use: No    History reviewed. No pertinent family history.  Review of Systems  Constitutional: Positive for malaise/fatigue. Negative for chills and fever.  Respiratory: Negative for cough and shortness of breath.   Cardiovascular: Negative for chest pain, palpitations and leg swelling.  Gastrointestinal: Positive for heartburn. Negative for abdominal pain, nausea and vomiting.  Genitourinary: Positive for frequency.  Neurological: Positive for dizziness and tingling.  Endo/Heme/Allergies: Positive for polydipsia.  Psychiatric/Behavioral: The patient is nervous/anxious.      OBJECTIVE:  Today's Vitals   01/22/20 1504  BP: 134/80  Pulse:  100  Temp: 98.3 F (36.8 C)  SpO2: 96%  Weight: 214 lb 3.2 oz (97.2 kg)  Height: 6' 0.44" (1.84 m)   Body mass index is 28.7 kg/m.  Wt Readings from Last 3 Encounters:  01/22/20 214 lb 3.2 oz (97.2 kg)  06/24/18 203 lb (92.1 kg)  06/21/17 217 lb (98.4 kg)    Physical Exam Vitals and nursing note reviewed.  Constitutional:      Appearance: He is well-developed.  HENT:     Head: Normocephalic and atraumatic.  Eyes:     Conjunctiva/sclera: Conjunctivae normal.     Pupils: Pupils are equal, round, and reactive to light.  Cardiovascular:     Rate and Rhythm: Normal rate and regular rhythm.     Heart sounds: No murmur. No friction rub. No gallop.   Pulmonary:     Effort: Pulmonary effort is normal.     Breath sounds: Normal breath sounds. No wheezing or rales.  Musculoskeletal:     Cervical back: Neck supple.     Right lower leg: No edema.     Left lower leg: No edema.  Skin:    General: Skin is warm and dry.  Neurological:     Mental Status: He is alert and oriented to person, place, and time.  Psychiatric:        Mood and Affect: Mood is anxious.     Results for orders placed or performed in visit on 01/22/20 (from the past 24 hour(s))  POCT A1C     Status: Abnormal   Collection Time: 01/22/20  3:25 PM  Result Value Ref Range   Hemoglobin A1C 12.5 (A) 4.0 - 5.6 %   HbA1c POC (<> result, manual entry)     HbA1c, POC (prediabetic range)     HbA1c, POC (controlled diabetic range)      No results found.   ASSESSMENT and PLAN  1. Uncontrolled type 2 diabetes mellitus with diabetic polyneuropathy, without long-term current use of insulin (HCC) Uncontrolled. Starting lantus. Reviewed titration, home blood glucose monitoring, r/se/b. Cont with trulicity. Referring to podiatry, recommended he discuss DM shoes with them. Renewed disability placard. Has upcoming appt with ophtho.  - CMP14+EGFR - Microalbumin / creatinine urine ratio - TSH - Lipid panel - POCT A1C -  Ambulatory referral to Podiatry - Dulaglutide (TRULICITY) 4.49 EE/1.0OF SOPN; INJECT5 0.75MG INTO THE SKIN ONCE A WEEK - lisinopril (ZESTRIL) 5 MG tablet; Take 0.5 tablets (2.5 mg total) by mouth daily.  2. Essential hypertension Controlled. Continue current regime.  - CMP14+EGFR - TSH - Lipid panel  3. Colon cancer screening - Ambulatory referral to Gastroenterology  4. Generalized anxiety disorder Stable. Cont with current regime - ALPRAZolam (XANAX) 0.5 MG tablet; TAKE 1/2 TO 1 (ONE-HALF TO ONE) TABLET BY MOUTH AT BEDTIME AS NEEDED FOR ANXIETY OR  SLEEP   5. Attention deficit disorder, unspecified hyperactivity presence Stable. Cont with current regime. - amphetamine-dextroamphetamine (ADDERALL) 15 MG tablet; Take 0.5-1 tablets by mouth daily.  Other orders - insulin glargine (LANTUS SOLOSTAR) 100 UNIT/ML Solostar Pen; Inject 16 Units into the skin at bedtime. - Insulin Pen Needle (PEN NEEDLES) 32G X 6 MM MISC; 1 each by Does not apply route at bedtime. - gabapentin (NEURONTIN) 100 MG capsule; Take 1 capsule (100 mg total) by mouth 3 (three) times daily. - blood glucose meter kit and supplies KIT; Per insurance preference. Check blood glucose once a day. Dx E11.9, Z79.4  Return in about 3 months (around 04/23/2020).    Rutherford Guys, MD Primary Care at [place], [place] [address] Ph.  [phone] Fax [phone]

## 2020-01-22 NOTE — Patient Instructions (Addendum)
    Start lantus at 4 units at bedtime x 1 week, increase by 4 units once a week until reach 16 units at bedtime  If you have lab work done today you will be contacted with your lab results within the next 2 weeks.  If you have not heard from [place] then please contact us. The fastest way to get your results is to register for My Chart.   IF you received an x-ray today, you will receive an invoice from [facility]. Please contact [facility] at [phone] with questions or concerns regarding your invoice.   IF you received labwork today, you will receive an invoice from [place]. Please contact [facility] at [phone] with questions or concerns regarding your invoice.   Our billing staff will not be able to assist you with questions regarding bills from these companies.  You will be contacted with the lab results as soon as they are available. The fastest way to get your results is to activate your My Chart account. Instructions are located on the last page of this paperwork. If you have not heard from [place] regarding the results in 2 weeks, please contact this office.

## 2020-01-23 LAB — LIPID PANEL
Chol/HDL Ratio: 4.5 ratio (ref 0.0–5.0)
Cholesterol, Total: 175 mg/dL (ref 100–199)
HDL: 39 mg/dL — ABNORMAL LOW (ref >39)
LDL Chol Calc (NIH): 106 mg/dL — ABNORMAL HIGH (ref 0–99)
Triglycerides: 170 mg/dL — ABNORMAL HIGH (ref 0–149)
VLDL Cholesterol Cal: 30 mg/dL (ref 5–40)

## 2020-01-23 LAB — CMP14+EGFR
ALT: 79 IU/L — ABNORMAL HIGH (ref 0–44)
AST: 44 IU/L — ABNORMAL HIGH (ref 0–40)
Albumin/Globulin Ratio: 1.5 (ref 1.2–2.2)
Albumin: 4.4 g/dL (ref 3.8–4.9)
Alkaline Phosphatase: 121 IU/L — ABNORMAL HIGH (ref 39–117)
BUN/Creatinine Ratio: 15 (ref 9–20)
BUN: 15 mg/dL (ref 6–24)
Bilirubin Total: 0.7 mg/dL (ref 0.0–1.2)
CO2: 21 mmol/L (ref 20–29)
Calcium: 9.8 mg/dL (ref 8.7–10.2)
Chloride: 101 mmol/L (ref 96–106)
Creatinine, Ser: 0.97 mg/dL (ref 0.76–1.27)
GFR calc Af Amer: 99 mL/min/{1.73_m2} (ref >59)
GFR calc non Af Amer: 86 mL/min/{1.73_m2} (ref >59)
Globulin, Total: 3 g/dL (ref 1.5–4.5)
Glucose: 228 mg/dL — ABNORMAL HIGH (ref 65–99)
Potassium: 4.5 mmol/L (ref 3.5–5.2)
Sodium: 139 mmol/L (ref 134–144)
Total Protein: 7.4 g/dL (ref 6.0–8.5)

## 2020-01-23 LAB — TSH: TSH: 2.37 u[IU]/mL (ref 0.450–4.500)

## 2020-02-05 ENCOUNTER — Telehealth: Payer: Self-pay | Admitting: Family Medicine

## 2020-02-05 NOTE — Telephone Encounter (Signed)
Pt called states that the insulin pen medication he takes it ever night before bed it says to take 16 units he also states he took Gabapentin with insulin the first night. He woke up after taking medication after taken  3 nights in a row he woke up with his feet burning. Hes stopped taking medication on 3/24. Pt states  feet night are feeling better. Pt is wanting a nurse to call him regarding this. [phone] Please advise.

## 2020-02-09 ENCOUNTER — Other Ambulatory Visit: Payer: Self-pay | Admitting: Family Medicine

## 2020-02-09 DIAGNOSIS — E1169 Type 2 diabetes mellitus with other specified complication: Secondary | ICD-10-CM

## 2020-02-09 DIAGNOSIS — E785 Hyperlipidemia, unspecified: Secondary | ICD-10-CM

## 2020-02-09 DIAGNOSIS — R748 Abnormal levels of other serum enzymes: Secondary | ICD-10-CM

## 2020-02-09 MED ORDER — ATORVASTATIN CALCIUM 20 MG PO TABS: 20.0000 mg | ORAL_TABLET | Freq: Every day | ORAL | 3 refills | Status: DC

## 2020-02-11 NOTE — Telephone Encounter (Signed)
Called patient 2x no answer = lvm to callback and schedule

## 2020-03-15 ENCOUNTER — Ambulatory Visit: Payer: Medicare Other | Admitting: Podiatry

## 2020-03-24 ENCOUNTER — Other Ambulatory Visit: Payer: Self-pay | Admitting: Family Medicine

## 2020-03-24 DIAGNOSIS — K219 Gastro-esophageal reflux disease without esophagitis: Secondary | ICD-10-CM

## 2020-03-24 NOTE — Telephone Encounter (Signed)
Requested Prescriptions  Pending Prescriptions Disp Refills  . omeprazole (PRILOSEC) 40 MG capsule [Pharmacy Med Name: Omeprazole 40 MG Oral Capsule Delayed Release] 90 capsule 2    Sig: Take 1 capsule by mouth once daily     Gastroenterology: Proton Pump Inhibitors Passed - 03/24/2020  3:35 PM      Passed - Valid encounter within last 12 months    Recent Outpatient Visits          2 months ago Uncontrolled type 2 diabetes mellitus with diabetic polyneuropathy, without long-term current use of insulin (HCC)   [facility] [place] [facility], Meda Coffee, MD   12 months ago Uncontrolled type 2 diabetes mellitus with diabetic polyneuropathy, without long-term current use of insulin [facility])   [facility] [place] [facility], Meda Coffee, MD   1 year ago Type 2 diabetes mellitus with hyperglycemia, without long-term current use of insulin [facility])   Primary Care at [place], [place] D, PA-C   2 years ago Uncontrolled type 2 diabetes mellitus with diabetic polyneuropathy, without long-term current use of insulin (HCC)   Primary Care at [place], [place] D, [place]   2 years ago Gastroesophageal reflux disease without esophagitis   [facility], Marolyn Hammock, PA-C      Future Appointments            In 4 weeks Myles Lipps, MD Primary Care at [place], [facility]

## 2020-03-26 ENCOUNTER — Encounter: Payer: Self-pay | Admitting: Family Medicine

## 2020-04-13 ENCOUNTER — Telehealth: Payer: Self-pay | Admitting: *Deleted

## 2020-04-13 NOTE — Telephone Encounter (Signed)
Schedule AWV.  

## 2020-04-22 ENCOUNTER — Other Ambulatory Visit: Payer: Self-pay

## 2020-04-22 ENCOUNTER — Ambulatory Visit (INDEPENDENT_AMBULATORY_CARE_PROVIDER_SITE_OTHER): Payer: Medicare Other | Admitting: Family Medicine

## 2020-04-22 ENCOUNTER — Ambulatory Visit (INDEPENDENT_AMBULATORY_CARE_PROVIDER_SITE_OTHER): Payer: Medicare Other

## 2020-04-22 ENCOUNTER — Encounter: Payer: Self-pay | Admitting: Family Medicine

## 2020-04-22 VITALS — BP 139/75 | HR 98 | Temp 97.9°F | Ht 72.0 in | Wt 214.0 lb

## 2020-04-22 DIAGNOSIS — K219 Gastro-esophageal reflux disease without esophagitis: Secondary | ICD-10-CM

## 2020-04-22 DIAGNOSIS — R748 Abnormal levels of other serum enzymes: Secondary | ICD-10-CM | POA: Diagnosis not present

## 2020-04-22 DIAGNOSIS — IMO0002 Reserved for concepts with insufficient information to code with codable children: Secondary | ICD-10-CM

## 2020-04-22 DIAGNOSIS — F411 Generalized anxiety disorder: Secondary | ICD-10-CM | POA: Diagnosis not present

## 2020-04-22 DIAGNOSIS — M2042 Other hammer toe(s) (acquired), left foot: Secondary | ICD-10-CM

## 2020-04-22 DIAGNOSIS — R0600 Dyspnea, unspecified: Secondary | ICD-10-CM | POA: Diagnosis not present

## 2020-04-22 DIAGNOSIS — E1165 Type 2 diabetes mellitus with hyperglycemia: Secondary | ICD-10-CM

## 2020-04-22 DIAGNOSIS — F988 Other specified behavioral and emotional disorders with onset usually occurring in childhood and adolescence: Secondary | ICD-10-CM

## 2020-04-22 DIAGNOSIS — E1169 Type 2 diabetes mellitus with other specified complication: Secondary | ICD-10-CM | POA: Diagnosis not present

## 2020-04-22 DIAGNOSIS — E1142 Type 2 diabetes mellitus with diabetic polyneuropathy: Secondary | ICD-10-CM

## 2020-04-22 DIAGNOSIS — E785 Hyperlipidemia, unspecified: Secondary | ICD-10-CM

## 2020-04-22 DIAGNOSIS — M2041 Other hammer toe(s) (acquired), right foot: Secondary | ICD-10-CM

## 2020-04-22 DIAGNOSIS — R0609 Other forms of dyspnea: Secondary | ICD-10-CM

## 2020-04-22 LAB — POCT GLYCOSYLATED HEMOGLOBIN (HGB A1C): Hemoglobin A1C: 11.8 % — AB (ref 4.0–5.6)

## 2020-04-22 MED ORDER — OMEPRAZOLE 40 MG PO CPDR: 40.0000 mg | DELAYED_RELEASE_CAPSULE | Freq: Every day | ORAL | 3 refills | Status: AC

## 2020-04-22 MED ORDER — LISINOPRIL 5 MG PO TABS: 2.5000 mg | ORAL_TABLET | Freq: Every day | ORAL | 1 refills | Status: DC

## 2020-04-22 MED ORDER — AMPHETAMINE-DEXTROAMPHETAMINE 15 MG PO TABS: 7.5000 mg | ORAL_TABLET | Freq: Every day | ORAL | 0 refills | Status: DC

## 2020-04-22 MED ORDER — ATORVASTATIN CALCIUM 20 MG PO TABS: 20.0000 mg | ORAL_TABLET | Freq: Every day | ORAL | 3 refills | Status: DC

## 2020-04-22 MED ORDER — ALPRAZOLAM 0.5 MG PO TABS: ORAL_TABLET | ORAL | 0 refills | Status: DC

## 2020-04-22 MED ORDER — TRULICITY 1.5 MG/0.5ML ~~LOC~~ SOAJ: 1.5000 mg | SUBCUTANEOUS | 5 refills | Status: DC

## 2020-04-22 NOTE — Progress Notes (Signed)
6/10/20213:19 PM  [name] Jul 15, 1961, 59 y.o., male [id number]  Chief Complaint  Patient presents with  . Diabetes    stopped taking lantus 2 days after prescribed due to burning on both feet  . Hypertension  . Shortness of Breath    sometimes around the house w/ activity     HPI:   Patient is a 59 y.o. male with past medical history significant for DM2, ADD, GERD  who presents today for routine followup  Last OV March 2021 - started lantus and atorvastatin  Patient reports that he has been having lots of problems with his feet He continues to take gabapentin 178m once a day He reports that he feels that lantus was causing worsening neuropathy, he has continued trulicity Checking cbgs at home, 200-300s Nocturia x 2-33  pmp reviewed  Uses adderrall and xanax prn  Needs refills  Having DOE for past several months No chest pain or palpitations or nausea, it is associated with diaphoresis, no cough or wheezing Used to be chain smoker Last smoked about 10 years ago  Lab Results  Component Value Date   HGBA1C 12.5 (A) 01/22/2020   HGBA1C 13.6 (A) 06/24/2018   HGBA1C 9.8 (H) 06/21/2017   Lab Results  Component Value Date   MICROALBUR 3.28 (H) 04/03/2013   LDLCALC 106 (H) 01/22/2020   CREATININE 0.97 01/22/2020    Wt Readings from Last 3 Encounters:  04/22/20 214 lb (97.1 kg)  01/22/20 214 lb 3.2 oz (97.2 kg)  06/24/18 203 lb (92.1 kg)    [facility] 04/22/2020 01/22/2020 03/27/2019  Decreased Interest 0 0 0  Down, Depressed, Hopeless 0 0 0  PHQ - 2 Score 0 0 0  Altered sleeping - - -  Tired, decreased energy - - -  Change in appetite - - -  Feeling bad or failure about yourself  - - -  Trouble concentrating - - -  Moving slowly or fidgety/restless - - -  Suicidal thoughts - - -  PHQ-9 Score - - -  Difficult doing work/chores - - -    Fall Risk  04/22/2020 01/22/2020 03/27/2019 03/27/2019 06/24/2018  Falls in the past year? 0 0 0 0 No    Number falls in past yr: 0 0 0 0 -  Injury with Fall? 0 0 0 0 -  Follow up Falls evaluation completed - - - -     No Known Allergies  Prior to Admission medications   Medication Sig Start Date End Date Taking? Authorizing Provider  ALPRAZolam (XANAX) 0.5 MG tablet TAKE 1/2 TO 1 (ONE-HALF TO ONE) TABLET BY MOUTH AT BEDTIME AS NEEDED FOR ANXIETY OR SLEEP 01/22/20  Yes SRutherford Guys MD  amphetamine-dextroamphetamine (ADDERALL) 15 MG tablet Take 0.5-1 tablets by mouth daily. 01/22/20  Yes SRutherford Guys MD  aspirin 81 MG tablet Take 1 tablet (81 mg total) by mouth daily. 04/22/15  Yes MJaynee Eagles PA-C  atorvastatin (LIPITOR) 20 MG tablet Take 1 tablet (20 mg total) by mouth daily. 02/09/20  Yes SRutherford Guys MD  blood glucose meter kit and supplies KIT Per insurance preference. Check blood glucose once a day. Dx E11.9, Z79.4 01/22/20  Yes SRutherford Guys MD  Dulaglutide (TRULICITY) 05.03MTW/6.5KCSOPN INJECT5 0.75MG INTO THE SKIN ONCE A WEEK 01/22/20  Yes SRutherford Guys MD  gabapentin (NEURONTIN) 100 MG capsule Take 1 capsule (100 mg total) by mouth 3 (three) times daily. 01/22/20  Yes SGrant FontanaM,  MD  glucose blood test strip Test blood sugar daily. Dx code: 250.60. 08/09/16  Yes [name] D, PA  Insulin Pen Needle (PEN NEEDLES) 32G X 6 MM MISC 1 each by Does not apply route at bedtime. 01/22/20  Yes Rutherford Guys, MD  Lancets MISC Test blood sugar daily. Dx code 250.60 08/09/16  Yes [name] D, PA  lisinopril (ZESTRIL) 5 MG tablet Take 0.5 tablets (2.5 mg total) by mouth daily. 01/22/20  Yes Rutherford Guys, MD  omeprazole (PRILOSEC) 40 MG capsule Take 1 capsule by mouth once daily 03/24/20  Yes Rutherford Guys, MD  insulin glargine (LANTUS SOLOSTAR) 100 UNIT/ML Solostar Pen Inject 16 Units into the skin at bedtime. Patient not taking: Reported on 04/22/2020 01/22/20   Rutherford Guys, MD  Omeprazole (PRILOSEC PO) Take 1 tablet by mouth every other day.  03/21/12   [provider]    Past Medical History:  Diagnosis Date  . Anxiety   . Arthritis   . Diabetes mellitus     Past Surgical History:  Procedure Laterality Date  . VARICOSE VEIN SURGERY     bilateral    Social History   Tobacco Use  . Smoking status: Former Smoker    Types: Cigars    Quit date: 01/09/2017    Years since quitting: 3.2  . Smokeless tobacco: Never Used  Substance Use Topics  . Alcohol use: No    No family history on file.  ROS   OBJECTIVE:  Today's Vitals   04/22/20 1459  BP: 139/75  Pulse: 98  Temp: 97.9 F (36.6 C)  SpO2: 98%  Weight: 214 lb (97.1 kg)  Height: 6' (1.829 m)   Body mass index is 29.02 kg/m.   Physical Exam  Diabetic Foot Form - Detailed   Diabetic Foot Exam - detailed Can the patient see the bottom of their feet?: Yes Are the shoes appropriate in style and fit?: Yes Is there swelling or and abnormal foot shape?: Yes Is there a claw toe deformity?: Yes Is there elevated skin temparature?: No Is there foot or ankle muscle weakness?: No Normal Range of Motion: No Semmes-Weinstein Monofilament Test R Site 1-Great Toe: Pos L Site 1-Great Toe: Neg        Results for orders placed or performed in visit on 04/22/20 (from the past 24 hour(s))  POCT glycosylated hemoglobin (Hb A1C)     Status: Abnormal   Collection Time: 04/22/20  3:36 PM  Result Value Ref Range   Hemoglobin A1C 11.8 (A) 4.0 - 5.6 %   HbA1c POC (<> result, manual entry)     HbA1c, POC (prediabetic range)     HbA1c, POC (controlled diabetic range)      No results found.  My interpretation of EKG:  NSR, HR 87, PVCs  ASSESSMENT and PLAN  1. Uncontrolled type 2 diabetes mellitus with diabetic polyneuropathy, without long-term current use of insulin (HCC) Increase trulicity, restart lantus, reviewed r/se/b - POCT glycosylated hemoglobin (Hb A1C) - Microalbumin/Creatinine Ratio, Urine - lisinopril (ZESTRIL) 5 MG tablet; Take 0.5 tablets (2.5 mg  total) by mouth daily.  2. Hyperlipidemia associated with type 2 diabetes mellitus ([place]) Checking labs today, medications will be adjusted as needed.  - Lipid panel  3. Abnormal liver enzymes - Comprehensive metabolic panel  4. Generalized anxiety disorder - ALPRAZolam (XANAX) 0.5 MG tablet; TAKE 1/2 TO 1 (ONE-HALF TO ONE) TABLET BY MOUTH AT BEDTIME AS NEEDED FOR ANXIETY OR SLEEP - amphetamine-dextroamphetamine (ADDERALL)  15 MG tablet; Take 0.5-1 tablets by mouth daily.  5. Attention deficit disorder, unspecified hyperactivity presence - amphetamine-dextroamphetamine (ADDERALL) 15 MG tablet; Take 0.5-1 tablets by mouth daily.  6. Gastroesophageal reflux disease without esophagitis - omeprazole (PRILOSEC) 40 MG capsule; Take 1 capsule (40 mg total) by mouth daily.  7. DOE (dyspnea on exertion) - EKG 12-Lead - normal - DG Chest 2 View - no official read yet, my read - normal - Ambulatory referral to Cardiology - given RF r/o CAD  8. Hammer toes of both feet - Ambulatory referral to Podiatry  Other orders - Dulaglutide (TRULICITY) 1.5 KC/0.0LK SOPN; Inject 0.5 mLs (1.5 mg total) into the skin once a week. - atorvastatin (LIPITOR) 20 MG tablet; Take 1 tablet (20 mg total) by mouth daily.  Return in about 4 weeks (around 05/20/2020).    Rutherford Guys, MD Primary Care at [place], [place] [address] Ph.  [phone] Fax [phone]

## 2020-04-22 NOTE — Patient Instructions (Addendum)
  Restart lantus at bedtime Continue taking trulicity Increase gabapentin 100mg  three times a day    If you have lab work done today you will be contacted with your lab results within the next 2 weeks.  If you have not heard from then please contact us. The fastest way to get your results is to register for My Chart.   IF you received an x-ray today, you will receive an invoice from [facility]. Please contact [facility] at [phone] with questions or concerns regarding your invoice.   IF you received labwork today, you will receive an invoice from [place]. Please contact [facility] at [phone] with questions or concerns regarding your invoice.   Our billing staff will not be able to assist you with questions regarding bills from these companies.  You will be contacted with the lab results as soon as they are available. The fastest way to get your results is to activate your My Chart account. Instructions are located on the last page of this paperwork. If you have not heard from [phone] regarding the results in 2 weeks, please contact this office.

## 2020-04-23 LAB — COMPREHENSIVE METABOLIC PANEL
ALT: 54 IU/L — ABNORMAL HIGH (ref 0–44)
AST: 31 IU/L (ref 0–40)
Albumin/Globulin Ratio: 1.5 (ref 1.2–2.2)
Albumin: 4.4 g/dL (ref 3.8–4.9)
Alkaline Phosphatase: 108 IU/L (ref 48–121)
BUN/Creatinine Ratio: 22 — ABNORMAL HIGH (ref 9–20)
BUN: 20 mg/dL (ref 6–24)
Bilirubin Total: 0.5 mg/dL (ref 0.0–1.2)
CO2: 21 mmol/L (ref 20–29)
Calcium: 9.6 mg/dL (ref 8.7–10.2)
Chloride: 99 mmol/L (ref 96–106)
Creatinine, Ser: 0.89 mg/dL (ref 0.76–1.27)
GFR calc Af Amer: 108 mL/min/{1.73_m2} (ref >59)
GFR calc non Af Amer: 94 mL/min/{1.73_m2} (ref >59)
Globulin, Total: 2.9 g/dL (ref 1.5–4.5)
Glucose: 281 mg/dL — ABNORMAL HIGH (ref 65–99)
Potassium: 4.7 mmol/L (ref 3.5–5.2)
Sodium: 137 mmol/L (ref 134–144)
Total Protein: 7.3 g/dL (ref 6.0–8.5)

## 2020-04-23 LAB — LIPID PANEL
Chol/HDL Ratio: 4.6 ratio (ref 0.0–5.0)
Cholesterol, Total: 173 mg/dL (ref 100–199)
HDL: 38 mg/dL — ABNORMAL LOW (ref >39)
LDL Chol Calc (NIH): 103 mg/dL — ABNORMAL HIGH (ref 0–99)
Triglycerides: 181 mg/dL — ABNORMAL HIGH (ref 0–149)
VLDL Cholesterol Cal: 32 mg/dL (ref 5–40)

## 2020-04-23 LAB — MICROALBUMIN / CREATININE URINE RATIO
Creatinine, Urine: 133 mg/dL
Microalb/Creat Ratio: 26 mg/g creat (ref 0–29)
Microalbumin, Urine: 34.2 ug/mL

## 2020-05-03 ENCOUNTER — Encounter: Payer: Self-pay | Admitting: Family Medicine

## 2020-05-10 ENCOUNTER — Other Ambulatory Visit: Payer: Self-pay

## 2020-05-10 NOTE — Progress Notes (Signed)
Received call from [facility], wrong codes were used. Lab has not been able to ship out kit. Correct code was given z12.11. Original order had dm codes. Form is being faxed to confirm change of dx code by myself

## 2020-05-25 ENCOUNTER — Ambulatory Visit (INDEPENDENT_AMBULATORY_CARE_PROVIDER_SITE_OTHER): Payer: Medicare Other | Admitting: Family Medicine

## 2020-05-25 ENCOUNTER — Other Ambulatory Visit: Payer: Self-pay

## 2020-05-25 ENCOUNTER — Encounter: Payer: Self-pay | Admitting: Family Medicine

## 2020-05-25 VITALS — BP 139/89 | HR 100 | Temp 97.9°F | Ht 72.0 in | Wt 219.4 lb

## 2020-05-25 DIAGNOSIS — E1165 Type 2 diabetes mellitus with hyperglycemia: Secondary | ICD-10-CM | POA: Diagnosis not present

## 2020-05-25 DIAGNOSIS — E1142 Type 2 diabetes mellitus with diabetic polyneuropathy: Secondary | ICD-10-CM | POA: Diagnosis not present

## 2020-05-25 MED ORDER — GABAPENTIN 300 MG PO CAPS: 300.0000 mg | ORAL_CAPSULE | Freq: Three times a day (TID) | ORAL | 3 refills | Status: DC

## 2020-05-25 NOTE — Progress Notes (Signed)
7/13/20214:12 PM  Elenore Rota Clingan 1961/11/02, 59 y.o., male [id number]  Chief Complaint  Patient presents with  . Follow-up    has appt for podiatry nx month and has to make appt for cards. Needing the address on summary for cards office  . Diabetes    HPI:   Patient is a 59 y.o. male with past medical history significant for DM2, ADD, GERDwho presents today for routine followup  Last oV June 4967 - increased trulicity, restarted lantus  He has started higher dose trulicity, tolerating well Has not started lantus as concerned about having low cbgs PN not well controlled - tolerating gabapentin, no worsening dizziness nor sedation  Lab Results  Component Value Date   HGBA1C 11.8 (A) 04/22/2020    Depression screen [facility] 2/9 04/22/2020 01/22/2020 03/27/2019  Decreased Interest 0 0 0  Down, Depressed, Hopeless 0 0 0  PHQ - 2 Score 0 0 0  Altered sleeping - - -  Tired, decreased energy - - -  Change in appetite - - -  Feeling bad or failure about yourself  - - -  Trouble concentrating - - -  Moving slowly or fidgety/restless - - -  Suicidal thoughts - - -  PHQ-9 Score - - -  Difficult doing work/chores - - -    Fall Risk  04/22/2020 01/22/2020 03/27/2019 03/27/2019 06/24/2018  Falls in the past year? 0 0 0 0 No  Number falls in past yr: 0 0 0 0 -  Injury with Fall? 0 0 0 0 -  Follow up Falls evaluation completed - - - -     No Known Allergies  Prior to Admission medications   Medication Sig Start Date End Date Taking? Authorizing Provider  ALPRAZolam (XANAX) 0.5 MG tablet TAKE 1/2 TO 1 (ONE-HALF TO ONE) TABLET BY MOUTH AT BEDTIME AS NEEDED FOR ANXIETY OR SLEEP 04/22/20   Rutherford Guys, MD  amphetamine-dextroamphetamine (ADDERALL) 15 MG tablet Take 0.5-1 tablets by mouth daily. 04/22/20   Rutherford Guys, MD  aspirin 81 MG tablet Take 1 tablet (81 mg total) by mouth daily. 04/22/15   Jaynee Eagles, PA-C  atorvastatin (LIPITOR) 20 MG tablet Take 1 tablet (20 mg total) by mouth  daily. 04/22/20   Rutherford Guys, MD  blood glucose meter kit and supplies KIT Per insurance preference. Check blood glucose once a day. Dx E11.9, Z79.4 01/22/20   Rutherford Guys, MD  Dulaglutide (TRULICITY) 1.5 RF/1.6BW SOPN Inject 0.5 mLs (1.5 mg total) into the skin once a week. 04/22/20   Rutherford Guys, MD  gabapentin (NEURONTIN) 100 MG capsule Take 1 capsule (100 mg total) by mouth 3 (three) times daily. 01/22/20   Rutherford Guys, MD  glucose blood test strip Test blood sugar daily. Dx code: 250.60. 08/09/16   [name] D, PA  insulin glargine (LANTUS SOLOSTAR) 100 UNIT/ML Solostar Pen Inject 16 Units into the skin at bedtime. Patient not taking: Reported on 04/22/2020 01/22/20   Rutherford Guys, MD  Insulin Pen Needle (PEN NEEDLES) 32G X 6 MM MISC 1 each by Does not apply route at bedtime. 01/22/20   Rutherford Guys, MD  Lancets MISC Test blood sugar daily. Dx code 250.60 08/09/16   [name] D, PA  lisinopril (ZESTRIL) 5 MG tablet Take 0.5 tablets (2.5 mg total) by mouth daily. 04/22/20   Rutherford Guys, MD  omeprazole (PRILOSEC) 40 MG capsule Take 1 capsule (40 mg total) by mouth daily. 04/22/20  Santiago, Irma M, MD  Omeprazole (PRILOSEC PO) Take 1 tablet by mouth every other day.  03/21/12  [provider]    Past Medical History:  Diagnosis Date  . Anxiety   . Arthritis   . Diabetes mellitus     Past Surgical History:  Procedure Laterality Date  . VARICOSE VEIN SURGERY     bilateral    Social History   Tobacco Use  . Smoking status: Former Smoker    Types: Cigars    Quit date: 01/09/2017    Years since quitting: 3.3  . Smokeless tobacco: Never Used  Substance Use Topics  . Alcohol use: No    No family history on file.  ROS Per hpi  OBJECTIVE:  Today's Vitals   05/25/20 1618  BP: 139/89  Pulse: 100  Temp: 97.9 F (36.6 C)  SpO2: 97%  Weight: 219 lb 6.4 oz (99.5 kg)  Height: 6' (1.829 m)   There is no height or weight on file  to calculate BMI.   Physical Exam Vitals and nursing note reviewed.  Constitutional:      Appearance: He is well-developed.  HENT:     Head: Normocephalic and atraumatic.  Eyes:     Conjunctiva/sclera: Conjunctivae normal.     Pupils: Pupils are equal, round, and reactive to light.  Pulmonary:     Effort: Pulmonary effort is normal.  Musculoskeletal:     Cervical back: Neck supple.  Skin:    General: Skin is warm and dry.  Neurological:     Mental Status: He is alert and oriented to person, place, and time.     No results found for this or any previous visit (from the past 24 hour(s)).  No results found.   ASSESSMENT and PLAN  1. Uncontrolled type 2 diabetes mellitus with hyperglycemia (HCC) Discussed use of insulin in DM2, reviewed r/se/b. discussed slow titration of lantus. Cont working on LFM  2. Diabetic polyneuropathy associated with type 2 diabetes mellitus (HCC) Not controlled. Increase gabapentin to 300mg TID Other orders - gabapentin (NEURONTIN) 300 MG capsule; Take 1 capsule (300 mg total) by mouth 3 (three) times daily.  Return in about 2 months (around 07/26/2020).    Irma M Santiago, MD [facility] [address] [place], [place] [address] Ph.  [phone] Fax [phone]   

## 2020-05-25 NOTE — Patient Instructions (Addendum)
Start lantus at 5 units at bedtime lantus controls only fasting blood glucose Fasting blood glucose goal are 90-130 Increase lantus by 2 units every 5 days until fasting glucose around 130  Increase gabapentin to 300mg  three times a day  [facility] [address], [place], [place]  [phone]     If you have lab work done today you will be contacted with your lab results within the next 2 weeks.  If you have not heard from [phone] then please contact us. The fastest way to get your results is to register for My Chart.   IF you received an x-ray today, you will receive an invoice from [facility]. Please contact [facility] at [phone] with questions or concerns regarding your invoice.   IF you received labwork today, you will receive an invoice from [place]. Please contact [facility] at [phone] with questions or concerns regarding your invoice.   Our billing staff will not be able to assist you with questions regarding bills from these companies.  You will be contacted with the lab results as soon as they are available. The fastest way to get your results is to activate your My Chart account. Instructions are located on the last page of this paperwork. If you have not heard from [phone] regarding the results in 2 weeks, please contact this office.

## 2020-06-09 ENCOUNTER — Ambulatory Visit (INDEPENDENT_AMBULATORY_CARE_PROVIDER_SITE_OTHER): Payer: Medicare Other | Admitting: Podiatry

## 2020-06-09 ENCOUNTER — Encounter: Payer: Self-pay | Admitting: Podiatry

## 2020-06-09 ENCOUNTER — Other Ambulatory Visit: Payer: Self-pay

## 2020-06-09 DIAGNOSIS — M79674 Pain in right toe(s): Secondary | ICD-10-CM

## 2020-06-09 DIAGNOSIS — M79675 Pain in left toe(s): Secondary | ICD-10-CM

## 2020-06-09 DIAGNOSIS — L84 Corns and callosities: Secondary | ICD-10-CM

## 2020-06-09 DIAGNOSIS — B351 Tinea unguium: Secondary | ICD-10-CM | POA: Diagnosis not present

## 2020-06-09 DIAGNOSIS — E1149 Type 2 diabetes mellitus with other diabetic neurological complication: Secondary | ICD-10-CM | POA: Diagnosis not present

## 2020-06-09 DIAGNOSIS — E1140 Type 2 diabetes mellitus with diabetic neuropathy, unspecified: Secondary | ICD-10-CM

## 2020-06-09 NOTE — Progress Notes (Signed)
Subjective:   Patient ID: [name], male   DOB: 59 y.o.   MRN: [id number]   HPI Patient presents stating that he has had long-term diabetes has painful corns on nails and foot structure and burning pain and states he needs new shoes diabetic.  Patient does not smoke and has had diabetes for around 10 years of treatment  5   Review of Systems  All other systems reviewed and are negative.       Objective:  Physical Exam Vitals and nursing note reviewed.  Constitutional:      Appearance: He is well-developed.  Pulmonary:     Effort: Pulmonary effort is normal.  Musculoskeletal:        General: Normal range of motion.  Skin:    General: Skin is warm.  Neurological:     Mental Status: He is alert.     Neurovascular status found to be diminished with diminished sharp dull vibratory bilateral.  Patient is noted to have significant cavus foot structure with significant hammertoe deformity bilateral and severe keratotic lesions inside fifth digit right lateral that are painful.  Patient has thickened yellow brittle nailbeds 1-5 both feet that he cannot take care of himself and runs an A1c of between 11 and 12.  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  At this point I went ahead I debrided nailbeds 1-5 both feet and lesions on both feet with no iatrogenic bleeding and patient will be seen back for routine care.  Patient is encouraged to call us with questions concerns which may come up and will have diabetic shoes made due to his long-term condition and strong at risk factors

## 2020-06-10 ENCOUNTER — Telehealth: Payer: Self-pay | Admitting: *Deleted

## 2020-06-10 NOTE — Telephone Encounter (Signed)
Schedule awv  

## 2020-06-15 ENCOUNTER — Other Ambulatory Visit: Payer: Self-pay

## 2020-06-15 ENCOUNTER — Ambulatory Visit: Payer: Medicare Other | Admitting: Orthotics

## 2020-06-15 DIAGNOSIS — B351 Tinea unguium: Secondary | ICD-10-CM

## 2020-06-15 DIAGNOSIS — L84 Corns and callosities: Secondary | ICD-10-CM

## 2020-06-15 DIAGNOSIS — E1140 Type 2 diabetes mellitus with diabetic neuropathy, unspecified: Secondary | ICD-10-CM

## 2020-06-15 DIAGNOSIS — M79674 Pain in right toe(s): Secondary | ICD-10-CM

## 2020-06-15 NOTE — Progress Notes (Signed)

## 2020-06-18 ENCOUNTER — Telehealth: Payer: Self-pay

## 2020-06-18 NOTE — Telephone Encounter (Signed)
Received Form from [facility] from our provider. She has signed it and I have faxed it back to [phone].  Thanks.

## 2020-07-08 LAB — HM DIABETES EYE EXAM

## 2020-07-14 ENCOUNTER — Telehealth: Payer: Self-pay | Admitting: Podiatry

## 2020-07-14 NOTE — Telephone Encounter (Signed)
Pt left message today @ 1131 asking to see if diabetic shoes were in..   I returned call and left message for pt that they are in and to call to schedule an appt to pick them up.  I also left message yesterday for him to call to schedule an appt.

## 2020-07-22 ENCOUNTER — Ambulatory Visit: Payer: Medicare Other | Admitting: Family Medicine

## 2020-07-23 ENCOUNTER — Other Ambulatory Visit: Payer: Self-pay

## 2020-07-23 ENCOUNTER — Ambulatory Visit: Payer: Medicare Other | Admitting: Orthotics

## 2020-07-23 DIAGNOSIS — E1140 Type 2 diabetes mellitus with diabetic neuropathy, unspecified: Secondary | ICD-10-CM | POA: Diagnosis not present

## 2020-07-23 DIAGNOSIS — M216X2 Other acquired deformities of left foot: Secondary | ICD-10-CM

## 2020-07-23 DIAGNOSIS — M216X1 Other acquired deformities of right foot: Secondary | ICD-10-CM | POA: Diagnosis not present

## 2020-07-23 DIAGNOSIS — M2041 Other hammer toe(s) (acquired), right foot: Secondary | ICD-10-CM | POA: Diagnosis not present

## 2020-07-23 DIAGNOSIS — M2042 Other hammer toe(s) (acquired), left foot: Secondary | ICD-10-CM

## 2020-08-23 ENCOUNTER — Encounter: Payer: Self-pay | Admitting: Family Medicine

## 2020-11-30 ENCOUNTER — Other Ambulatory Visit: Payer: Self-pay | Admitting: *Deleted

## 2020-11-30 ENCOUNTER — Other Ambulatory Visit: Payer: Self-pay | Admitting: Family Medicine

## 2020-11-30 ENCOUNTER — Encounter: Payer: Self-pay | Admitting: Family Medicine

## 2020-11-30 ENCOUNTER — Telehealth (INDEPENDENT_AMBULATORY_CARE_PROVIDER_SITE_OTHER): Payer: Medicare Other | Admitting: Family Medicine

## 2020-11-30 DIAGNOSIS — F988 Other specified behavioral and emotional disorders with onset usually occurring in childhood and adolescence: Secondary | ICD-10-CM | POA: Diagnosis not present

## 2020-11-30 DIAGNOSIS — F411 Generalized anxiety disorder: Secondary | ICD-10-CM

## 2020-11-30 DIAGNOSIS — E1165 Type 2 diabetes mellitus with hyperglycemia: Secondary | ICD-10-CM | POA: Diagnosis not present

## 2020-11-30 DIAGNOSIS — K219 Gastro-esophageal reflux disease without esophagitis: Secondary | ICD-10-CM | POA: Diagnosis not present

## 2020-11-30 MED ORDER — AMPHETAMINE-DEXTROAMPHETAMINE 15 MG PO TABS: 7.5000 mg | ORAL_TABLET | Freq: Every day | ORAL | 0 refills | Status: DC

## 2020-11-30 MED ORDER — TRULICITY 1.5 MG/0.5ML ~~LOC~~ SOAJ: 1.5000 mg | SUBCUTANEOUS | 3 refills | Status: DC

## 2020-11-30 MED ORDER — ALPRAZOLAM 0.5 MG PO TABS: ORAL_TABLET | ORAL | 0 refills | Status: AC

## 2020-11-30 MED ORDER — TRULICITY 1.5 MG/0.5ML ~~LOC~~ SOAJ: 1.5000 mg | SUBCUTANEOUS | 0 refills | Status: DC

## 2020-11-30 NOTE — Progress Notes (Signed)
Virtual Visit Note  I connected with patient on 11/30/20 at 1440 by telephone due to unable to work Epic video visit and verified that I am speaking with the correct person using two identifiers. [name] is currently located at home and no family members are currently with them during visit. The provider, Laurita Quint Jaxon Mynhier, FNP is located in their office at time of visit.  I discussed the limitations, risks, security and privacy concerns of performing an evaluation and management service by telephone and the availability of in person appointments. I also discussed with the patient that there may be a patient responsible charge related to this service. The patient expressed understanding and agreed to proceed.   I provided 20 minutes of non-face-to-face time during this encounter.  Chief Complaint  Patient presents with  . Diabetes    Trulicity Rx  weekly injection / short script sent today Also xanax and adderall prescription      HPI ? Patient is a 60 y.o. male with past medical history significant for DM2, ADD, GERDwho presents today for routine follow-up.  DM Lantus (never started) Trulicity 1.5: takes once a week was out of medication this week Gabapentin increased to 331m tid Doesn't check BG at home Neuropathy bilateral feet Lives by himself Needs to follow up with podiatry (last visit 9/1) Verbalizes black spots on bottom of toe and complete numbness to to bilateral feet Has lots of swelling to bilateral feet Eye doctor last year: no issues Car broke down in November, takes the bus now Needs to go to the dentist Can normally tell when has high sugars due to dizziness Continues to struggle with diet  Lab Results  Component Value Date   HGBA1C 11.8 (A) 04/22/2020    ADD PDMP reviewed Adderall and Xanax Needs refills of these  Noticed changes in color of urine  No Known Allergies  Prior to Admission medications   Medication Sig Start Date End Date  Taking? Authorizing Provider  ALPRAZolam (XANAX) 0.5 MG tablet TAKE 1/2 TO 1 (ONE-HALF TO ONE) TABLET BY MOUTH AT BEDTIME AS NEEDED FOR ANXIETY OR SLEEP 04/22/20  Yes SJacelyn Pi Irma M, MD  amphetamine-dextroamphetamine (ADDERALL) 15 MG tablet Take 0.5-1 tablets by mouth daily. 04/22/20  Yes SJacelyn Pi ILilia Argue MD  aspirin 81 MG tablet Take 1 tablet (81 mg total) by mouth daily. 04/22/15  Yes MJaynee Eagles PA-C  atorvastatin (LIPITOR) 20 MG tablet Take 1 tablet (20 mg total) by mouth daily. 04/22/20  Yes SJacelyn Pi Irma M, MD  Dulaglutide (TRULICITY) 1.5 MPH/1.5AVSOPN Inject 1.5 mg into the skin once a week. 11/30/20  Yes MMaximiano Coss NP  gabapentin (NEURONTIN) 300 MG capsule Take 1 capsule (300 mg total) by mouth 3 (three) times daily. 05/25/20  Yes SJacelyn Pi Irma M, MD  insulin glargine (LANTUS SOLOSTAR) 100 UNIT/ML Solostar Pen Inject 16 Units into the skin at bedtime. 01/22/20  Yes SJacelyn Pi Irma M, MD  lisinopril (ZESTRIL) 5 MG tablet Take 0.5 tablets (2.5 mg total) by mouth daily. 04/22/20  Yes SJacelyn Pi ILilia Argue MD  omeprazole (PRILOSEC) 40 MG capsule Take 1 capsule (40 mg total) by mouth daily. 04/22/20  Yes SJacelyn Pi ILilia Argue MD  blood glucose meter kit and supplies KIT Per insurance preference. Check blood glucose once a day. Dx E11.9, Z79.4 01/22/20   SJacelyn Pi ILilia Argue MD  glucose blood test strip Test blood sugar daily. Dx code: 250.60. 08/09/16   EJoretta Bachelor PA  Insulin Pen Needle (PEN NEEDLES) 32G X 6 MM MISC 1 each by Does not apply route at bedtime. 01/22/20   Daleen Squibb, MD  Lancets MISC Test blood sugar daily. Dx code 250.60 08/09/16   [name] D, PA  Omeprazole (PRILOSEC PO) Take 1 tablet by mouth every other day.  03/21/12  [provider]    Past Medical History:  Diagnosis Date  . Anxiety   . Arthritis   . Diabetes mellitus     Past Surgical History:  Procedure Laterality Date  . VARICOSE VEIN SURGERY      bilateral    Social History   Tobacco Use  . Smoking status: Former Smoker    Types: Cigars    Quit date: 01/09/2017    Years since quitting: 3.8  . Smokeless tobacco: Never Used  Substance Use Topics  . Alcohol use: No    History reviewed. No pertinent family history.  Review of Systems  Constitutional: Negative for chills, fever and malaise/fatigue.  Eyes: Negative for blurred vision and double vision.  Respiratory: Negative for cough, shortness of breath and wheezing.   Cardiovascular: Negative for chest pain, palpitations and leg swelling.  Gastrointestinal: Negative for abdominal pain, blood in stool, constipation, diarrhea, heartburn, nausea and vomiting.  Genitourinary: Negative for dysuria, frequency and hematuria.  Musculoskeletal: Negative for back pain and joint pain.  Skin: Negative for rash.  Neurological: Positive for sensory change (bilateral neuropathy of feet). Negative for dizziness, weakness and headaches.  Endo/Heme/Allergies: Positive for polydipsia.    Objective  Constitutional:      General: Not in acute distress.    Appearance: Normal appearance. Not ill-appearing.   Pulmonary:     Effort: Pulmonary effort is normal. No respiratory distress.  Neurological:     Mental Status: Alert and oriented to person, place, and time.  Psychiatric:        Mood and Affect: Mood normal.        Behavior: Behavior normal.     ASSESSMENT and PLAN  Problem List Items Addressed This Visit      Digestive   GERD     Endocrine   Diabetes type 2, uncontrolled ([place]) - Primary   Relevant Medications   Dulaglutide (TRULICITY) 1.5 OY/7.7AJ SOPN   Other Relevant Orders   Ambulatory referral to Endocrinology     Other   Generalized anxiety disorder   Relevant Medications   ALPRAZolam (XANAX) 0.5 MG tablet    Other Visit Diagnoses    Attention deficit disorder, unspecified hyperactivity presence       Relevant Medications   amphetamine-dextroamphetamine  (ADDERALL) 15 MG tablet      Plan . Refills sent . Needs to be seen in person for labs . Needs to follow up with podiatry  . Referral sent to endocrinology   Return in about 4 weeks (around 12/28/2020) for Labs.   The above assessment and management plan was discussed with the patient. The patient verbalized understanding of and has agreed to the management plan. Patient is aware to call the clinic if symptoms persist or worsen. Patient is aware when to return to the clinic for a follow-up visit. Patient educated on when it is appropriate to go to the emergency department.     Huston Foley Danah Reinecke, FNP-BC [facility] at [place], [place] [address] Ph.  [phone] Fax [phone]

## 2020-11-30 NOTE — Telephone Encounter (Signed)
Copied from CRM [id number]. Topic: Quick Communication - Rx Refill/Question >> Nov 30, 2020 10:42 AM Marylen Ponto wrote: Medication: Dulaglutide (TRULICITY) 1.5 MG/0.5ML SOPN  Has the patient contacted their pharmacy? yes   Preferred Pharmacy (with phone number or street name): [id number] [place], [place] - [address] [facility] [address]   Phone: [phone]   Fax: [phone]  Agent: Please be advised that RX refills may take up to 3 business days. We ask that you follow-up with your pharmacy.

## 2020-11-30 NOTE — Patient Instructions (Addendum)
Diabetes Mellitus and Foot Care Foot care is an important part of your health, especially when you have diabetes. Diabetes may cause you to have problems because of poor blood flow (circulation) to your feet and legs, which can cause your skin to:  Become thinner and drier.  Break more easily.  Heal more slowly.  Peel and crack. You may also have nerve damage (neuropathy) in your legs and feet, causing decreased feeling in them. This means that you may not notice minor injuries to your feet that could lead to more serious problems. Noticing and addressing any potential problems early is the best way to prevent future foot problems. How to care for your feet Foot hygiene  Wash your feet daily with warm water and mild soap. Do not use hot water. Then, pat your feet and the areas between your toes until they are completely dry. Do not soak your feet as this can dry your skin.  Trim your toenails straight across. Do not dig under them or around the cuticle. File the edges of your nails with an emery board or nail file.  Apply a moisturizing lotion or petroleum jelly to the skin on your feet and to dry, brittle toenails. Use lotion that does not contain alcohol and is unscented. Do not apply lotion between your toes.   Shoes and socks  Wear clean socks or stockings every day. Make sure they are not too tight. Do not wear knee-high stockings since they may decrease blood flow to your legs.  Wear shoes that fit properly and have enough cushioning. Always look in your shoes before you put them on to be sure there are no objects inside.  To break in new shoes, wear them for just a few hours a day. This prevents injuries on your feet. Wounds, scrapes, corns, and calluses  Check your feet daily for blisters, cuts, bruises, sores, and redness. If you cannot see the bottom of your feet, use a mirror or ask someone for help.  Do not cut corns or calluses or try to remove them with medicine.  If  you find a minor scrape, cut, or break in the skin on your feet, keep it and the skin around it clean and dry. You may clean these areas with mild soap and water. Do not clean the area with peroxide, alcohol, or iodine.  If you have a wound, scrape, corn, or callus on your foot, look at it several times a day to make sure it is healing and not infected. Check for: ? Redness, swelling, or pain. ? Fluid or blood. ? Warmth. ? Pus or a bad smell.   General tips  Do not cross your legs. This may decrease blood flow to your feet.  Do not use heating pads or hot water bottles on your feet. They may burn your skin. If you have lost feeling in your feet or legs, you may not know this is happening until it is too late.  Protect your feet from hot and cold by wearing shoes, such as at the beach or on hot pavement.  Schedule a complete foot exam at least once a year (annually) or more often if you have foot problems. Report any cuts, sores, or bruises to your health care provider immediately. Where to find more information  American Diabetes Association: www.diabetes.org  Association of Diabetes Care & Education Specialists: www.diabeteseducator.org Contact a health care provider if:  You have a medical condition that increases your risk of infection  and you have any cuts, sores, or bruises on your feet.  You have an injury that is not healing.  You have redness on your legs or feet.  You feel burning or tingling in your legs or feet.  You have pain or cramps in your legs and feet.  Your legs or feet are numb.  Your feet always feel cold.  You have pain around any toenails. Get help right away if:  You have a wound, scrape, corn, or callus on your foot and: ? You have pain, swelling, or redness that gets worse. ? You have fluid or blood coming from the wound, scrape, corn, or callus. ? Your wound, scrape, corn, or callus feels warm to the touch. ? You have pus or a bad smell coming  from the wound, scrape, corn, or callus. ? You have a fever. ? You have a red line going up your leg. Summary  Check your feet every day for blisters, cuts, bruises, sores, and redness.  Apply a moisturizing lotion or petroleum jelly to the skin on your feet and to dry, brittle toenails.  Wear shoes that fit properly and have enough cushioning.  If you have foot problems, report any cuts, sores, or bruises to your health care provider immediately.  Schedule a complete foot exam at least once a year (annually) or more often if you have foot problems. This information is not intended to replace advice given to you by your health care provider. Make sure you discuss any questions you have with your health care provider. Document Revised: 05/20/2020 Document Reviewed: 05/20/2020 Elsevier Patient Education  2021 [employer].    If you have lab work done today you will be contacted with your lab results within the next 2 weeks.  If you have not heard from [place] then please contact us. The fastest way to get your results is to register for My Chart.   IF you received an x-ray today, you will receive an invoice from [facility]. Please contact [facility] at [phone] with questions or concerns regarding your invoice.   IF you received labwork today, you will receive an invoice from [facility]. Please contact [facility] at [phone] with questions or concerns regarding your invoice.   Our billing staff will not be able to assist you with questions regarding bills from these companies.  You will be contacted with the lab results as soon as they are available. The fastest way to get your results is to activate your My Chart account. Instructions are located on the last page of this paperwork. If you have not heard from [place] regarding the results in 2 weeks, please contact this office.

## 2020-12-28 ENCOUNTER — Ambulatory Visit: Payer: Medicare Other

## 2021-01-24 ENCOUNTER — Ambulatory Visit: Payer: Medicare Other | Admitting: Endocrinology

## 2021-01-28 ENCOUNTER — Other Ambulatory Visit: Payer: Self-pay | Admitting: Registered Nurse

## 2021-01-28 DIAGNOSIS — E1165 Type 2 diabetes mellitus with hyperglycemia: Secondary | ICD-10-CM

## 2021-04-08 ENCOUNTER — Telehealth: Payer: Self-pay

## 2021-04-08 ENCOUNTER — Other Ambulatory Visit: Payer: Self-pay

## 2021-04-08 DIAGNOSIS — Z9189 Other specified personal risk factors, not elsewhere classified: Secondary | ICD-10-CM

## 2021-04-08 NOTE — Progress Notes (Signed)
[facility])                                            [facility] Quality Pharmacy Team                                        Statin Quality Measure Assessment    04/08/2021  Pearse Shiffler [birth year] [id number]  Per review of chart and payor information, this patient has been flagged for non-adherence to the following CMS Quality Measure:   [x]  Statin Use in Persons with Diabetes  []  Statin Use in Persons with Cardiovascular Disease  The 10-year ASCVD risk score DC Jr., et al., 2013) is: 22.4%   Values used to calculate the score:     Age: 60 years     Sex: Male     Is Non-Hispanic African American: No     Diabetic: Yes     Tobacco smoker: No     Systolic Blood Pressure: 139 mmHg     Is BP treated: Yes     HDL Cholesterol: 38 mg/dL     Total Cholesterol: 173 mg/dL  2014  Currently prescribed statin:  [x]  Yes []  No     Comments: atorvastatin 20 mg   Patient was previously at Primary Care at [facility] receiving care from Dr. 1/51/7616. Per payor information, patient's new PCP is Dr. but patient reports he has not established care with this provider. Patient reports that he has not taken atorvastatin in a long time as he ran out of medication over a year ago and cannot get refills. I offered to setup an appointment with a provider and pt declined. I informed patient that he would likely need to establish an appointment to have maintenance medication refilled. He stated that he would scheduled an appointment himself. Will f/u with the patient in 1-2 weeks to see if appointment established.   Thank you for your time,  , PharmD Clinical Pharmacist [facility] Cell: [phone]

## 2021-04-08 NOTE — Progress Notes (Unsigned)
This encounter was created in error - please disregard.

## 2022-01-26 IMAGING — DX DG CHEST 2V
2 series · 2 of 2 positions shown · non-contrast
Comparison: None.

CLINICAL DATA: Shortness of breath

EXAM:
CHEST - 2 VIEW

[chest pa]
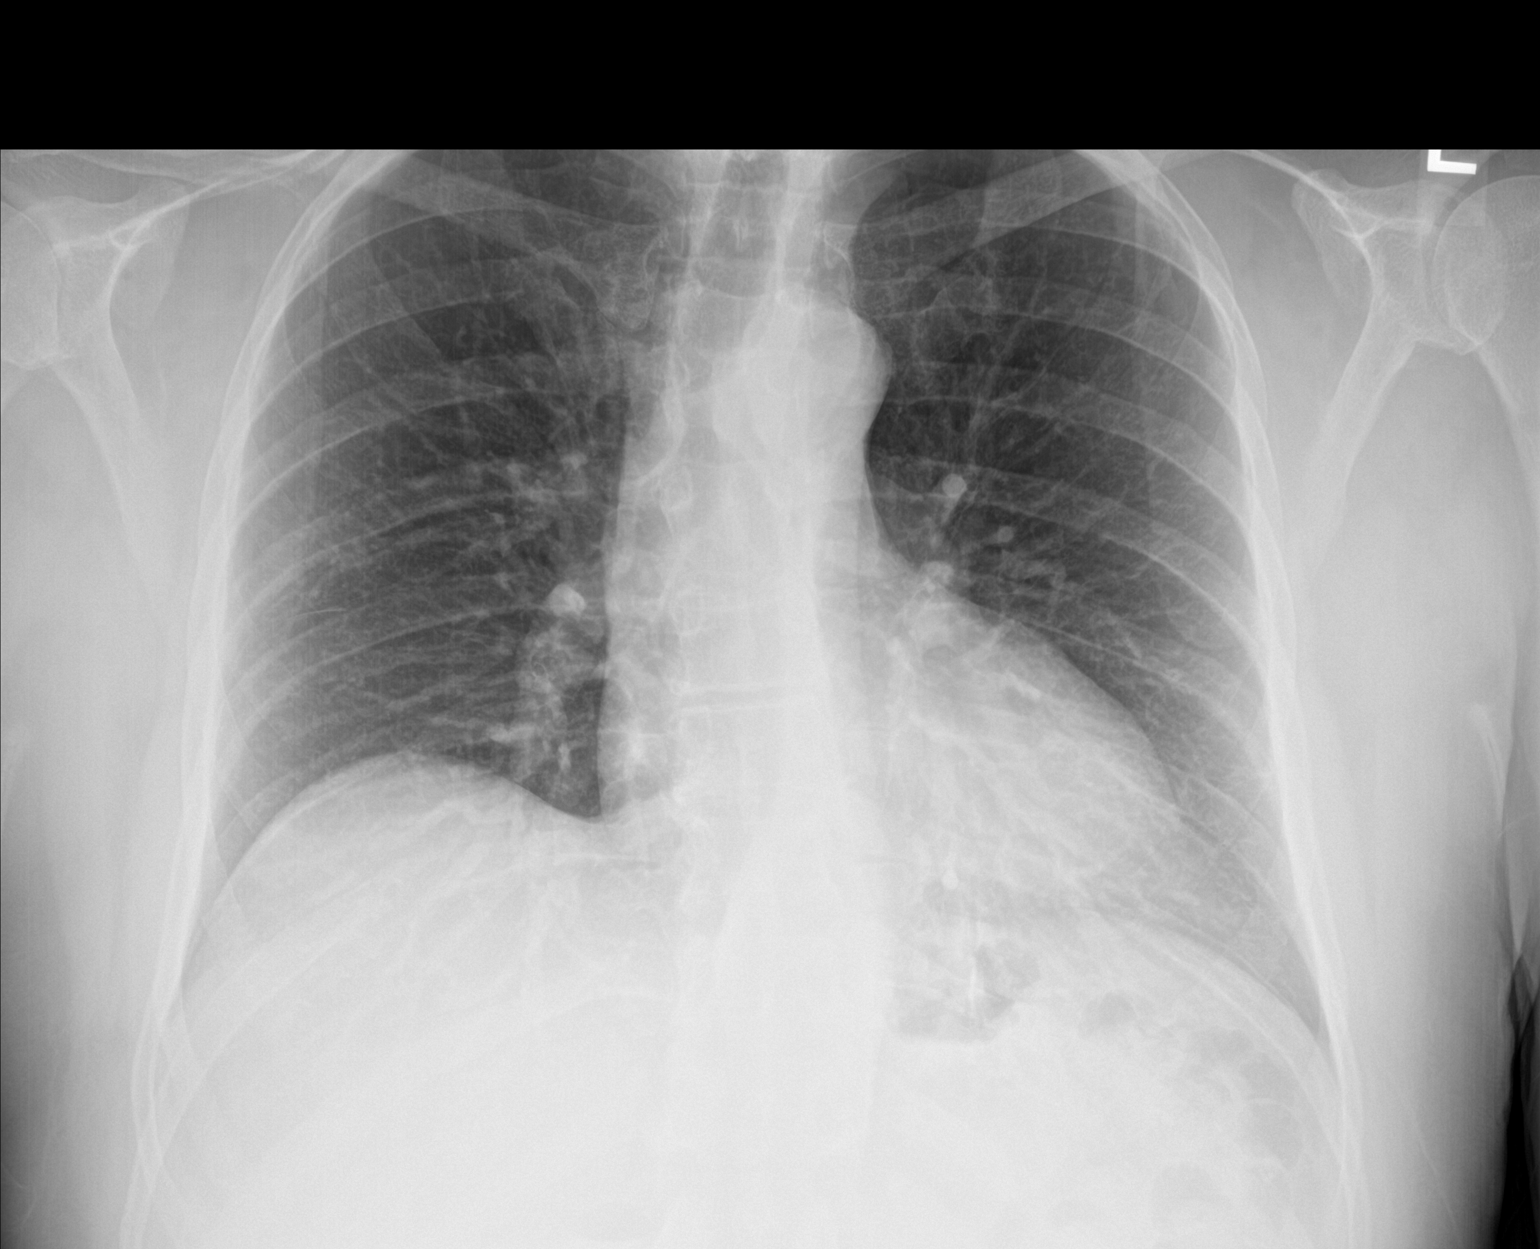

[chest lat]
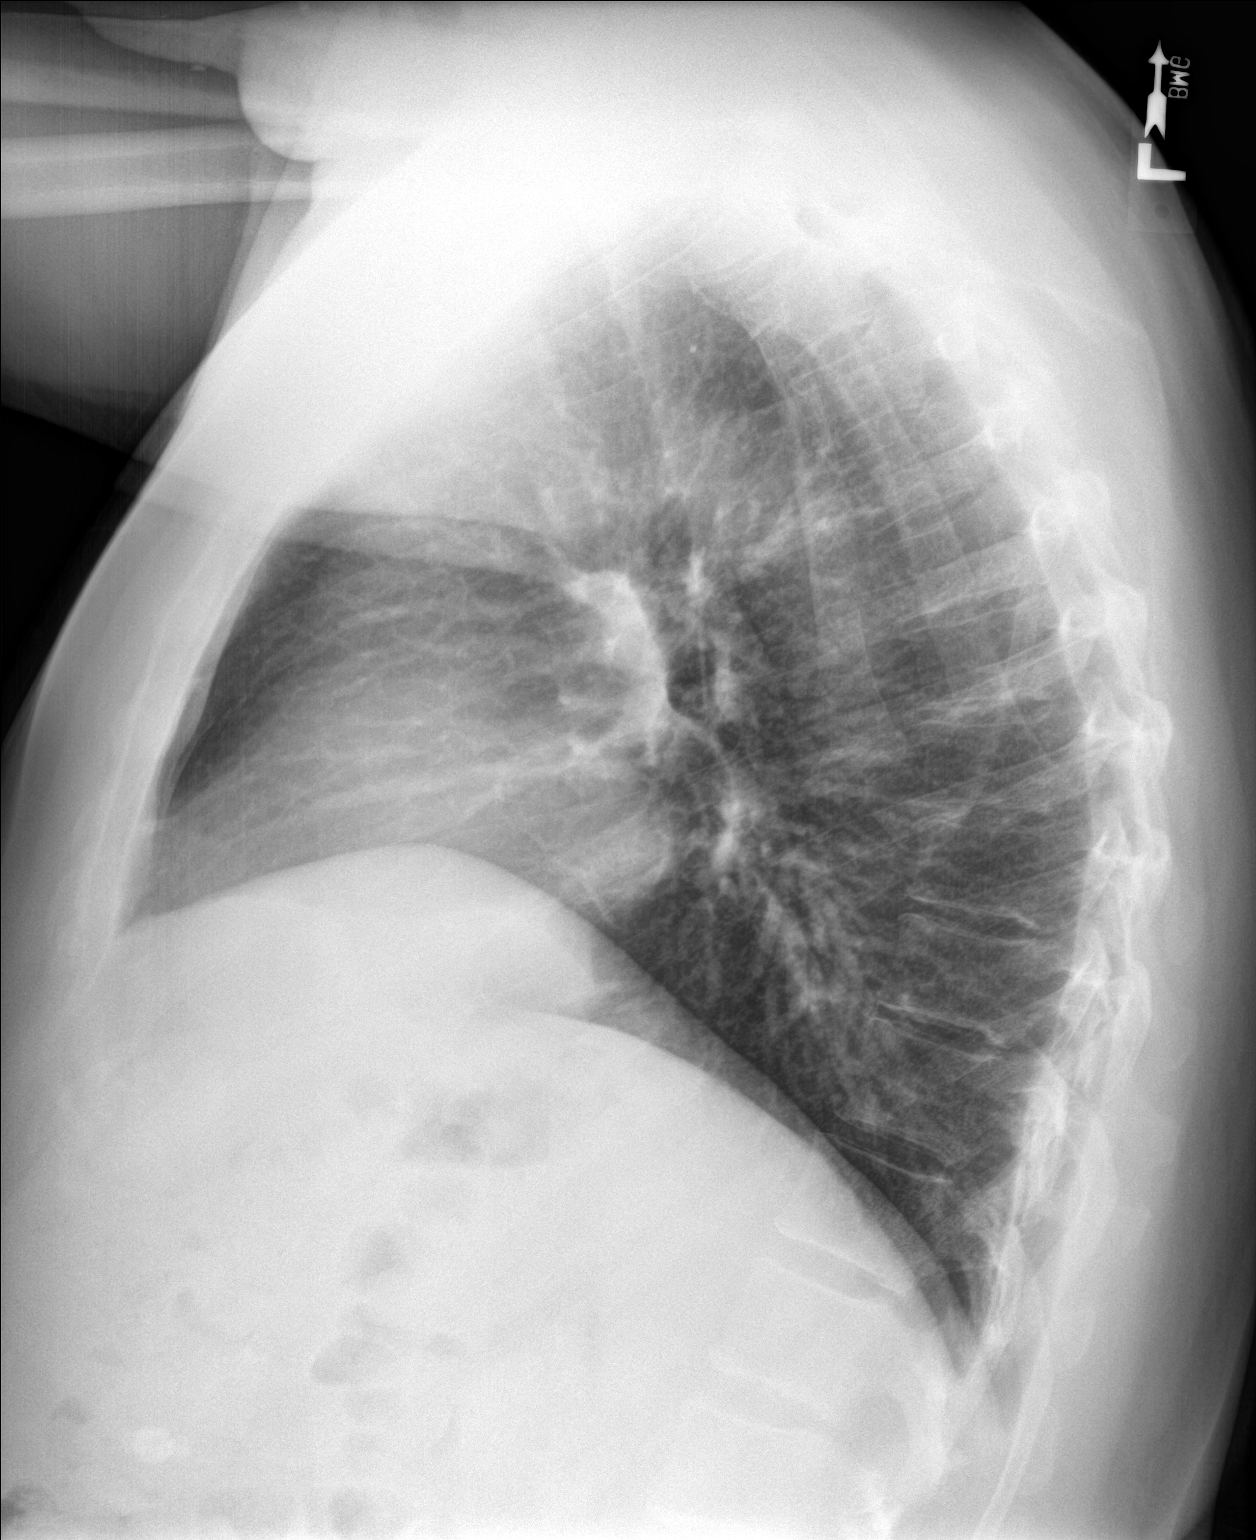

[2 of 2 positions shown; findings below may reference images not displayed]

FINDINGS: The heart size and mediastinal contours are within normal limits.
Both lungs are clear. The visualized skeletal structures are
unremarkable.
IMPRESSION: No active cardiopulmonary disease.

## 2022-02-14 ENCOUNTER — Other Ambulatory Visit: Payer: Self-pay

## 2022-02-14 ENCOUNTER — Encounter (HOSPITAL_BASED_OUTPATIENT_CLINIC_OR_DEPARTMENT_OTHER): Payer: Self-pay

## 2022-02-14 ENCOUNTER — Emergency Department (HOSPITAL_BASED_OUTPATIENT_CLINIC_OR_DEPARTMENT_OTHER)
Admission: EM | Admit: 2022-02-14 | Discharge: 2022-02-14 | Disposition: A | Discharge status: Home / Self Care | Payer: Medicare Other | Attending: Emergency Medicine | Admitting: Emergency Medicine

## 2022-02-14 ENCOUNTER — Emergency Department (HOSPITAL_BASED_OUTPATIENT_CLINIC_OR_DEPARTMENT_OTHER): Payer: Medicare Other

## 2022-02-14 DIAGNOSIS — Z7982 Long term (current) use of aspirin: Secondary | ICD-10-CM | POA: Insufficient documentation

## 2022-02-14 DIAGNOSIS — L089 Local infection of the skin and subcutaneous tissue, unspecified: Secondary | ICD-10-CM

## 2022-02-14 DIAGNOSIS — D72829 Elevated white blood cell count, unspecified: Secondary | ICD-10-CM | POA: Insufficient documentation

## 2022-02-14 DIAGNOSIS — Z794 Long term (current) use of insulin: Secondary | ICD-10-CM | POA: Diagnosis not present

## 2022-02-14 DIAGNOSIS — E119 Type 2 diabetes mellitus without complications: Secondary | ICD-10-CM | POA: Diagnosis not present

## 2022-02-14 DIAGNOSIS — R000 Tachycardia, unspecified: Secondary | ICD-10-CM | POA: Diagnosis not present

## 2022-02-14 DIAGNOSIS — B351 Tinea unguium: Secondary | ICD-10-CM | POA: Insufficient documentation

## 2022-02-14 DIAGNOSIS — Z4800 Encounter for change or removal of nonsurgical wound dressing: Secondary | ICD-10-CM | POA: Diagnosis present

## 2022-02-14 LAB — BASIC METABOLIC PANEL
Anion gap: 13 (ref 5–15)
BUN: 23 mg/dL — ABNORMAL HIGH (ref 6–20)
CO2: 23 mmol/L (ref 22–32)
Calcium: 10 mg/dL (ref 8.9–10.3)
Chloride: 97 mmol/L — ABNORMAL LOW (ref 98–111)
Creatinine, Ser: 1.13 mg/dL (ref 0.61–1.24)
GFR, Estimated: >60 mL/min (ref >60)
Glucose, Bld: 358 mg/dL — ABNORMAL HIGH (ref 70–99)
Potassium: 4.6 mmol/L (ref 3.5–5.1)
Sodium: 133 mmol/L — ABNORMAL LOW (ref 135–145)

## 2022-02-14 LAB — CBC WITH DIFFERENTIAL/PLATELET
Abs Immature Granulocytes: 0.03 10*3/uL (ref 0.00–0.07)
Basophils Absolute: 0.1 10*3/uL (ref 0.0–0.1)
Basophils Relative: 1 %
Eosinophils Absolute: 0.1 10*3/uL (ref 0.0–0.5)
Eosinophils Relative: 0 %
HCT: 43.5 % (ref 39.0–52.0)
Hemoglobin: 14.8 g/dL (ref 13.0–17.0)
Immature Granulocytes: 0 %
Lymphocytes Relative: 15 %
Lymphs Abs: 1.8 10*3/uL (ref 0.7–4.0)
MCH: 28.5 pg (ref 26.0–34.0)
MCHC: 34 g/dL (ref 30.0–36.0)
MCV: 83.8 fL (ref 80.0–100.0)
Monocytes Absolute: 0.4 10*3/uL (ref 0.1–1.0)
Monocytes Relative: 4 %
Neutro Abs: 9.9 10*3/uL — ABNORMAL HIGH (ref 1.7–7.7)
Neutrophils Relative %: 80 %
Platelets: 298 10*3/uL (ref 150–400)
RBC: 5.19 MIL/uL (ref 4.22–5.81)
RDW: 13 % (ref 11.5–15.5)
WBC: 12.3 10*3/uL — ABNORMAL HIGH (ref 4.0–10.5)
nRBC: 0 % (ref 0.0–0.2)

## 2022-02-14 LAB — LACTIC ACID, PLASMA: Lactic Acid, Venous: 1.4 mmol/L (ref 0.5–1.9)

## 2022-02-14 MED ORDER — DOXYCYCLINE HYCLATE 100 MG PO CAPS: 100.0000 mg | ORAL_CAPSULE | Freq: Two times a day (BID) | ORAL | 0 refills | Status: DC

## 2022-02-14 MED ORDER — DOXYCYCLINE HYCLATE 100 MG PO TABS
100.0000 mg | ORAL_TABLET | Freq: Once | ORAL | Status: AC
  Administered 2022-02-14: 100 mg via ORAL
  Filled 2022-02-14: qty 1

## 2022-02-14 MED ORDER — SODIUM CHLORIDE 0.9 % IV BOLUS
500.0000 mL | Freq: Once | INTRAVENOUS | Status: AC
  Administered 2022-02-14: 500 mL via INTRAVENOUS

## 2022-02-14 NOTE — ED Triage Notes (Signed)
Patient BIB [facility] from PCP Office for Wound Assessment. ? ?Endorses having Blister-Like Formation to Left Middle Toe approximately 1 Week ago that now appears infected. Patient has been treating with Neosporin at Home but Endorses Continued Drainage. ? ?No Fevers. History of Diabetes. ? ?NAD Noted during Triage. A&Ox4. GCS 15. Ambulatory. ?

## 2022-02-14 NOTE — ED Notes (Signed)
PA at the Bedside. ?

## 2022-02-14 NOTE — ED Provider Notes (Signed)
?[place] [facility] ?Provider Note ? ? ?CSN: [id number] ?Arrival date & time: 02/14/22  1708 ? ?  ? ?History ? ?Chief Complaint  ?Patient presents with  ? Wound Check  ? ? ?[name] is a 61 y.o. male with a past medical history of uncontrolled diabetes presenting with a wound to his left middle toe.  He was sent from his new PCP office because they wanted him to get antibiotics for his wound.  Says that on 3/23 he took a long walk and on 3/24 he started to notice a blister on his left middle toe.  On 3/25 he noted the blister had burst open.  He has been treating it with Neosporin however the wound continues to worsen.  Denies any pain and says that his feet are numb due to diabetes.  Says he no longer checks his blood sugars at home however his A1c around a year ago was 11.  He has not had it checked since.  Says that he takes Trulicity once a week and believes it helps him.  ? ? ? ?Home Medications ?Prior to Admission medications   ?Medication Sig Start Date End Date Taking? Authorizing Provider  ?ALPRAZolam (XANAX) 0.5 MG tablet TAKE 1/2 TO 1 (ONE-HALF TO ONE) TABLET BY MOUTH AT BEDTIME AS NEEDED FOR ANXIETY OR SLEEP 11/30/20   Just, Laurita Quint, FNP  ?amphetamine-dextroamphetamine (ADDERALL) 15 MG tablet Take 0.5-1 tablets by mouth daily. 11/30/20   Just, Laurita Quint, FNP  ?aspirin 81 MG tablet Take 1 tablet (81 mg total) by mouth daily. 04/22/15   Jaynee Eagles, PA-C  ?atorvastatin (LIPITOR) 20 MG tablet Take 1 tablet (20 mg total) by mouth daily. 04/22/20   Daleen Squibb, MD  ?blood glucose meter kit and supplies KIT Per insurance preference. Check blood glucose once a day. Dx E11.9, Z79.4 01/22/20   Jacelyn Pi, Lilia Argue, MD  ?gabapentin (NEURONTIN) 300 MG capsule Take 1 capsule (300 mg total) by mouth 3 (three) times daily. 05/25/20   Daleen Squibb, MD  ?glucose blood test strip Test blood sugar daily. Dx code: 250.60. 08/09/16   [name] D, PA  ?insulin glargine (LANTUS  SOLOSTAR) 100 UNIT/ML Solostar Pen Inject 16 Units into the skin at bedtime. 01/22/20   Daleen Squibb, MD  ?Insulin Pen Needle (PEN NEEDLES) 32G X 6 MM MISC 1 each by Does not apply route at bedtime. 01/22/20   Daleen Squibb, MD  ?Lancets MISC Test blood sugar daily. Dx code 250.60 08/09/16   [name] D, PA  ?lisinopril (ZESTRIL) 5 MG tablet Take 0.5 tablets (2.5 mg total) by mouth daily. 04/22/20   Daleen Squibb, MD  ?omeprazole (PRILOSEC) 40 MG capsule Take 1 capsule (40 mg total) by mouth daily. 04/22/20   Daleen Squibb, MD  ?TRULICITY 1.5 KD/9.8PJ SOPN ADMINISTER 1.5 MG UNDER THE SKIN 1 TIME A WEEK 01/28/21   Just, Laurita Quint, FNP  ?Omeprazole (PRILOSEC PO) Take 1 tablet by mouth every other day.  03/21/12  [provider]  ?   ? ?Allergies    ?Patient has no known allergies.   ? ?Review of Systems   ?Review of Systems  ?Constitutional:  Negative for chills and fever.  ?Respiratory:  Negative for shortness of breath.   ?Cardiovascular:  Negative for chest pain.  ?Neurological:  Positive for numbness.  ? ?Physical Exam ?Updated Vital Signs ?BP (!) 177/119 (BP Location: Right Arm)   Pulse Marland Kitchen)  120   Temp 98.2 ?F (36.8 ?C) (Oral)   Resp 18   Ht 6' (1.829 m)   Wt 99.5 kg   SpO2 99%   BMI 29.75 kg/m?  ?Physical Exam ?Vitals and nursing note reviewed.  ?Constitutional:   ?   Appearance: Normal appearance.  ?HENT:  ?   Head: Normocephalic and atraumatic.  ?Eyes:  ?   General: No scleral icterus. ?   Conjunctiva/sclera: Conjunctivae normal.  ?Cardiovascular:  ?   Comments: DP pulses dopplerable bilaterally ?Pulmonary:  ?   Effort: Pulmonary effort is normal. No respiratory distress.  ?Musculoskeletal:  ?   Comments: Images of patient's toe in his chart.  Full range of motion of MTPs and ankle.  ?Skin: ?   Capillary Refill: Capillary refill takes less than 2 seconds.  ?   Findings: No rash.  ?Neurological:  ?   Mental Status: He is alert.  ?   Sensory: Sensory deficit  present.  ?   Comments: Patient is with no sensation on the plantar surface of his left foot.  Some sensation to light touch along the plantar right foot over the first and second metatarsals.  ?Psychiatric:     ?   Mood and Affect: Mood normal.  ? ? ?ED Results / Procedures / Treatments   ?Labs ?(all labs ordered are listed, but only abnormal results are displayed) ?Labs Reviewed  ?CBC WITH DIFFERENTIAL/PLATELET - Abnormal; Notable for the following components:  ?    Result Value  ? WBC 12.3 (*)   ? Neutro Abs 9.9 (*)   ? All other components within normal limits  ?BASIC METABOLIC PANEL - Abnormal; Notable for the following components:  ? Sodium 133 (*)   ? Chloride 97 (*)   ? Glucose, Bld 358 (*)   ? BUN 23 (*)   ? All other components within normal limits  ?LACTIC ACID, PLASMA  ?LACTIC ACID, PLASMA  ? ? ?EKG ?None ? ?Radiology ?DG Foot Complete Left ? ?Result Date: 02/14/2022 ?CLINICAL DATA:  Per ED Notes - Patient BIB [facility] from PCP Office for Wound Assessment. Endorses having Blister-Like Formation to Left Middle Toe approximately 1 Week ago that now appears infected. Patient has been treating with Neosporin at Home EXAM: LEFT FOOT - COMPLETE 3+ VIEW COMPARISON:  None. FINDINGS: Paratracheal erosion at the first proximal interphalangeal joint. There is loss of the joint space and associated sclerosis and osteophytosis. No osseous erosion evident in the third digit. No subcutaneous gas identified. IMPRESSION: 1. 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 2. No osteomyelitis of the middle digit. Electronically Signed   By: Suzy Bouchard M.D.   On: 02/14/2022 17:53   ? ?Procedures ?Procedures  ? ? ?Medications Ordered in ED ?Medications - No data to display ? ?ED Course/ Medical Decision Making/ A&P ?Clinical Course as of 02/14/22 1839  ?Tue Feb 14, 2022  ?1837 Patient heart rate currently 98 at bedside [MR]  ?  ?Clinical Course User Index ?[MR] Tayvon Culley A, PA-C  ? ?                         ?Medical Decision Making ?Amount and/or Complexity of Data Reviewed ?Labs: ordered. ?Radiology: ordered. ? ?Risk ?Prescription drug management. ? ? ?61 year old male presented today from his primary care office for antibiotics for his foot wound.  External records reviewed and patient was seen at [facility] in [place].  Their note says "acute illness poses threat to  limb."  Their wound center was unable to see the patient urgently so he was sent here. ? ? ?Physical exam with obvious infection.  X-ray ordered and viewed by me, no obvious signs of underlying infection.  Radiologist does not note osteo on this limited scan. ? ? ?Lab work: Leukocytosis to 12.3, normal lactic, glucose in 300s on BMP, no gap. ? ?MDM/disposition: Patient is afebrile.  Tachycardia resolved.  Tachycardia could be of mixed etiology, infection and anxiety.  Leukocytosis likely and will resolve with antibiotic treatment.  Return precautions were discussed with the patient at bedside and he voiced understanding.  We will also follow-up with the PCP who sent him here for evaluation of his diabetes, he appears motivated to get control of his chronic illnesses at this time. ? ?Final Clinical Impression(s) / ED Diagnoses ?Final diagnoses:  ?Toe infection  ? ? ?Rx / DC Orders ?ED Discharge Orders   ? ?      Ordered  ?  doxycycline (VIBRAMYCIN) 100 MG capsule  2 times daily       ? 02/14/22 1859  ? ?  ?  ? ?  ? ?Results and diagnoses were explained to the patient. Return precautions discussed in full. Patient had no additional questions and expressed complete understanding. ? ? ?This chart was dictated using voice recognition software.  Despite best efforts to proofread,  errors can occur which can change the documentation meaning.  ?  ?Rhae Hammock, PA-C ?02/15/22 1249 ? ?  ?Dorie Rank, MD ?02/17/22 0730 ? ?

## 2022-02-14 NOTE — Discharge Instructions (Addendum)
Please call the podiatrist attached to these discharge papers for an appointment.  You should also follow-up with the primary care provider who saw you today about your diabetes other chronic illnesses. ?

## 2022-02-14 NOTE — ED Provider Notes (Incomplete)
?[facility] [facility] ?Provider Note ? ? ?CSN: [id number] ?Arrival date & time: 02/14/22  1708 ? ?  ? ?History ?{Add pertinent medical, surgical, social history, OB history to HPI:1} ?Chief Complaint  ?Patient presents with  ? Wound Check  ? ? ?[name] is a 61 y.o. male. ?23 walk ?24 neosporin bc bloster ?25 burst ? ?Wound Check ? ? ?  ? ?Home Medications ?Prior to Admission medications   ?Medication Sig Start Date End Date Taking? Authorizing Provider  ?ALPRAZolam (XANAX) 0.5 MG tablet TAKE 1/2 TO 1 (ONE-HALF TO ONE) TABLET BY MOUTH AT BEDTIME AS NEEDED FOR ANXIETY OR SLEEP 11/30/20   Just, Laurita Quint, FNP  ?amphetamine-dextroamphetamine (ADDERALL) 15 MG tablet Take 0.5-1 tablets by mouth daily. 11/30/20   Just, Laurita Quint, FNP  ?aspirin 81 MG tablet Take 1 tablet (81 mg total) by mouth daily. 04/22/15   Jaynee Eagles, PA-C  ?atorvastatin (LIPITOR) 20 MG tablet Take 1 tablet (20 mg total) by mouth daily. 04/22/20   Daleen Squibb, MD  ?blood glucose meter kit and supplies KIT Per insurance preference. Check blood glucose once a day. Dx E11.9, Z79.4 01/22/20   Jacelyn Pi, Lilia Argue, MD  ?gabapentin (NEURONTIN) 300 MG capsule Take 1 capsule (300 mg total) by mouth 3 (three) times daily. 05/25/20   Daleen Squibb, MD  ?glucose blood test strip Test blood sugar daily. Dx code: 250.60. 08/09/16   [name] D, PA  ?insulin glargine (LANTUS SOLOSTAR) 100 UNIT/ML Solostar Pen Inject 16 Units into the skin at bedtime. 01/22/20   Daleen Squibb, MD  ?Insulin Pen Needle (PEN NEEDLES) 32G X 6 MM MISC 1 each by Does not apply route at bedtime. 01/22/20   Daleen Squibb, MD  ?Lancets MISC Test blood sugar daily. Dx code 250.60 08/09/16   [name] D, PA  ?lisinopril (ZESTRIL) 5 MG tablet Take 0.5 tablets (2.5 mg total) by mouth daily. 04/22/20   Daleen Squibb, MD  ?omeprazole (PRILOSEC) 40 MG capsule Take 1 capsule (40 mg total) by mouth daily. 04/22/20   Daleen Squibb, MD  ?TRULICITY 1.5 HE/5.2DP SOPN ADMINISTER 1.5 MG UNDER THE SKIN 1 TIME A WEEK 01/28/21   Just, Laurita Quint, FNP  ?Omeprazole (PRILOSEC PO) Take 1 tablet by mouth every other day.  03/21/12  [provider]  ?   ? ?Allergies    ?Patient has no known allergies.   ? ?Review of Systems   ?Review of Systems ? ?Physical Exam ?Updated Vital Signs ?BP (!) 177/119 (BP Location: Right Arm)   Pulse (!) 120   Temp 98.2 ?F (36.8 ?C) (Oral)   Resp 18   Ht 6' (1.829 m)   Wt 99.5 kg   SpO2 99%   BMI 29.75 kg/m?  ?Physical Exam ? ?ED Results / Procedures / Treatments   ?Labs ?(all labs ordered are listed, but only abnormal results are displayed) ?Labs Reviewed - No data to display ? ?EKG ?None ? ?Radiology ?No results found. ? ?Procedures ?Procedures  ?{Document cardiac monitor, telemetry assessment procedure when appropriate:1} ? ?Medications Ordered in ED ?Medications - No data to display ? ?ED Course/ Medical Decision Making/ A&P ?  ?                        ?Medical Decision Making ? ?*** ? ?{Document critical care time when appropriate:1} ?{Document review of labs and clinical decision tools ie heart  score, Chads2Vasc2 etc:1}  ?{Document your independent review of radiology images, and any outside records:1} ?{Document your discussion with family members, caretakers, and with consultants:1} ?{Document social determinants of health affecting pt's care:1} ?{Document your decision making why or why not admission, treatments were needed:1} ?Final Clinical Impression(s) / ED Diagnoses ?Final diagnoses:  ?None  ? ? ?Rx / DC Orders ?ED Discharge Orders   ? ? None  ? ?  ? ? ?

## 2022-02-23 ENCOUNTER — Ambulatory Visit (INDEPENDENT_AMBULATORY_CARE_PROVIDER_SITE_OTHER): Payer: Medicare Other | Admitting: Podiatry

## 2022-02-23 ENCOUNTER — Ambulatory Visit (INDEPENDENT_AMBULATORY_CARE_PROVIDER_SITE_OTHER): Payer: Medicare Other

## 2022-02-23 DIAGNOSIS — L97529 Non-pressure chronic ulcer of other part of left foot with unspecified severity: Secondary | ICD-10-CM | POA: Diagnosis not present

## 2022-02-23 MED ORDER — DOXYCYCLINE HYCLATE 100 MG PO TABS: 100.0000 mg | ORAL_TABLET | Freq: Two times a day (BID) | ORAL | 1 refills | Status: DC

## 2022-02-24 NOTE — Progress Notes (Signed)
Subjective:  ? ?Patient ID: [name], male   DOB: 61 y.o.   MRN: [id number]  ? ?HPI ?Patient states he was seen at urgent care on April 4 and had a blister on his left third toe and has had a round of antibiotics and it still continuing to give him some problems.  He has not had any fever or systemic signs of infection but locally there is still been some drainage ? ? ?ROS ? ? ?   ?Objective:  ?Physical Exam  ?Neurovascular status unchanged currently with patient who does have a lot of digital pressure history of diabetes long-term and does have redness in the third digit left within the distal portion with a small area of drainage in the plantar aspect of the surface localized no proximal edema erythema or drainage noted ? ?   ?Assessment:  ?Abscess with cellulitis of the third digit left with long-term diabetic with neuropathic-like changes that is local with no indication of systemic involvement ? ?   ?Plan:  ?H&P x-ray reviewed and at this point debrided tissue flushed applied Iodosorb with sterile dressing instructed on home wet-to-dry dressings and placed him in a surgical shoe with a wedge there is no  plantar pressure on the foot.  I did discuss chances for amputation of this digit if it does not heal and at this point it is difficult to say which one direction and will go but he will keep off the toe and he will take his antibiotics as indicated and I did write him for a longer period of antibiotics currently doxycycline and gave strict instructions if there should be any systemic signs of infection proximal edema erythema drainage he is to reappoint immediately and if not he will be seen back in 2 weeks to rex-ray the foot ? ?X-rays indicate there may be some lysis distally but the middle and proximal phalanx look healthy currently ?   ? ? ?

## 2022-03-09 ENCOUNTER — Encounter: Payer: Self-pay | Admitting: Podiatry

## 2022-03-09 ENCOUNTER — Ambulatory Visit (INDEPENDENT_AMBULATORY_CARE_PROVIDER_SITE_OTHER): Payer: Medicare Other

## 2022-03-09 ENCOUNTER — Ambulatory Visit (INDEPENDENT_AMBULATORY_CARE_PROVIDER_SITE_OTHER): Payer: Medicare Other | Admitting: Podiatry

## 2022-03-09 DIAGNOSIS — L97529 Non-pressure chronic ulcer of other part of left foot with unspecified severity: Secondary | ICD-10-CM

## 2022-03-09 MED ORDER — DOXYCYCLINE HYCLATE 100 MG PO TABS: 100.0000 mg | ORAL_TABLET | Freq: Two times a day (BID) | ORAL | 0 refills | Status: DC

## 2022-03-10 NOTE — Progress Notes (Signed)
Subjective:  ? ?Patient ID: [name], male   DOB: 61 y.o.   MRN: [id number]  ? ?HPI ?Patient presents stating he feels like he is improving with his third digit left and he continues to wear the shoe with a wedge so there is no pressure on the digit and is continuing to treat it locally and feels like it is getting much better ? ? ?ROS ? ? ?   ?Objective:  ?Physical Exam  ?Neurovascular status unchanged with third digit left still showing some breakdown of tissue plantarly on the toe but it is improving with diminished subcutaneous exposure and minimal proximal edema erythema drainage noted with no odor no active drainage noted currently ? ?   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 ?   ?Plan:  ?H&P reviewed condition and x-ray and today I debrided tissue on the digit I debrided some tissue on the breakdown of tissue plantarly which measures around 7 x 7 mm and I applied Iodosorb with sterile dressing gave instructions for continued home wet-to-dry dressings continued utilization of the offloading shoe and working to do 2 more weeks of doxycycline.  I will see him back in 3 weeks he knows there still is a risk of eventual amputation of his digit but I am feeling increasingly optimistic ? ?X-ray does indicate there is osteo of the distal phalanx left third toe but I am hoping it stable at this level and will not progress and it does not appear to have gone across the distal joint ?   ? ? ?

## 2022-03-30 ENCOUNTER — Ambulatory Visit (INDEPENDENT_AMBULATORY_CARE_PROVIDER_SITE_OTHER): Payer: Medicare Other | Admitting: Podiatry

## 2022-03-30 ENCOUNTER — Ambulatory Visit (INDEPENDENT_AMBULATORY_CARE_PROVIDER_SITE_OTHER): Payer: Medicare Other

## 2022-03-30 DIAGNOSIS — L97529 Non-pressure chronic ulcer of other part of left foot with unspecified severity: Secondary | ICD-10-CM

## 2022-03-31 NOTE — Progress Notes (Signed)
Subjective:   Patient ID: [name], male   DOB: 61 y.o.   MRN: [id number]   HPI Patient presents stating it seems like it is slowly getting better and there is still slight redness.  Patient states he is just finished his antibiotics   ROS      Objective:  Physical Exam  Neurovascular status is unchanged from previous visit with slight swelling still noted to the third digit left but it is improving with a small area of tissue still that is open plantarly but it is healthy looking tissue no odor no erythema no edema no drainage noted     Assessment:  Ulceration left third digit with probable osteomyelitis that appears to be stable currently     Plan:  H&P x-ray taken reviewed and at this point we will continue with wet-to-dry dressings and I did dispense a toe crest to keep the toe off the ground.  Patient will keep it clean is doing a good job he understands absolutely that someday it still may require amputation but at this point the bone appears to be stable and I am not seeing further osteolysis changes from what occurred during the initial pathology.  Spent a great deal time going over I absolutely cannot guarantee that it will not require amputation and that if redness were to occur again or any signs of systemic infection he is to let us know immediately go to the emergency room  X-rays indicate the distal phalanx has resorbed in part of the proximal phalanx but it is stable with no other pathology noted no change from previous x-ray

## 2022-08-14 DIAGNOSIS — I10 Essential (primary) hypertension: Secondary | ICD-10-CM | POA: Insufficient documentation

## 2022-12-21 ENCOUNTER — Ambulatory Visit (INDEPENDENT_AMBULATORY_CARE_PROVIDER_SITE_OTHER): Payer: 59

## 2022-12-21 ENCOUNTER — Ambulatory Visit (INDEPENDENT_AMBULATORY_CARE_PROVIDER_SITE_OTHER): Payer: 59 | Admitting: Podiatry

## 2022-12-21 DIAGNOSIS — L97513 Non-pressure chronic ulcer of other part of right foot with necrosis of muscle: Secondary | ICD-10-CM

## 2022-12-21 DIAGNOSIS — L97522 Non-pressure chronic ulcer of other part of left foot with fat layer exposed: Secondary | ICD-10-CM

## 2022-12-21 DIAGNOSIS — S90851A Superficial foreign body, right foot, initial encounter: Secondary | ICD-10-CM

## 2022-12-21 DIAGNOSIS — L97519 Non-pressure chronic ulcer of other part of right foot with unspecified severity: Secondary | ICD-10-CM | POA: Diagnosis not present

## 2022-12-21 DIAGNOSIS — I739 Peripheral vascular disease, unspecified: Secondary | ICD-10-CM

## 2022-12-21 DIAGNOSIS — E1142 Type 2 diabetes mellitus with diabetic polyneuropathy: Secondary | ICD-10-CM

## 2022-12-21 DIAGNOSIS — L97529 Non-pressure chronic ulcer of other part of left foot with unspecified severity: Secondary | ICD-10-CM

## 2022-12-21 MED ORDER — DOXYCYCLINE HYCLATE 100 MG PO TABS: 100.0000 mg | ORAL_TABLET | Freq: Two times a day (BID) | ORAL | 0 refills | Status: AC

## 2022-12-21 NOTE — Progress Notes (Signed)
Subjective:  Patient ID: [name], male    DOB: 1961-05-17,  MRN: [id number]  Chief Complaint  Patient presents with   Diabetic Ulcer    Diabetic right  foot ulcer,  started in Jan when  the patient stepped on a piece of glass, A1c- 8.1, BG- not taking, X-Rays done today     62 y.o. male presents with concern for ulcer in the right forefoot as well as a chronic ulceration to the left hallux medial IPJ area.  He relates that approximately 1 to 2 months ago he stepped on a piece of glass from a broken light bulb while walking barefoot in his home.  He is not sure if he got all the glass out but thinks that he did.  He says that there was no wound initially after he remove the broken shard of glass but then he started soaking his foot and thereafter developed a large ulceration.  This ulcer has been present for approximately weeks to a month.  He has been putting antibiotic ointment in and on it.  Denies much drainage from the area.  Additionally on the left hallux he has chronic black eschar present overlying the interphalangeal joint.  He has previously been told that this could be due to gout or arthritis.  He says it never seems to go away but denies any drainage or significant redness but does have swelling there.  He denies any nausea vomiting fever chills recently.  He is very nervous at this appointment and repeatedly asks if there will be any concern for needing surgery or amputation on his right foot.  He also asked extensively about nutrition and how to lower his A1c which she says has been elevated last in the chart was 8.1.  Past Medical History:  Diagnosis Date   Anxiety    Arthritis    Diabetes mellitus     No Known Allergies  ROS: Negative except as per HPI above  Objective:  General: AAO x3, NAD  Dermatological: Attention directed to the right plantar forefoot there is no to be circular ulceration with hyperkeratotic tissue surrounding that probes deep to the level of  subcutaneous fat tissue and third MPJ capsule.  No obvious bone exposed in the incision however it does probe approximately 1 to 1.5 cm deep.  There is no significant drainage.  The wound does bleed well upon debridement.  Hyperkeratotic tissue surrounding and fibrotic and necrotic tissue present within the wound base.  No malodor or erythema noted.  Measures approximately 1.5 x 1 x 1 cm.  At the left hallux there is noted to be a large eschar present with black tissue overlying the medial aspect of the hallux IPJ.  There is significant hallux deformity and edema.  No obvious draining ulceration or significant erythema of the left great toe.       Vascular: DP and PT pulses decreased 1 out of 4 bilaterally  Neruologic: Absent sensation to light touch bilateral foot protective sensation absent  Musculoskeletal: Significant edema of the right forefoot and cavus foot structure.  Hammertoe deformities present bilaterally.  Left hallux with significant osseous irregularity of the IPJ as well as edema present.  Gait: Unassisted, Nonantalgic.   No images are attached to the encounter.  Radiographs:  Date: 12/21/2022 XR bilateral foot weightbearing AP/Lateral/Oblique   Findings: Attention directed on the right foot metatarsal heads of the second through fourth metatarsal there is no obvious erosion or osteolysis to suspect osteomyelitis.  No soft tissue  emphysema is noted.  No radiolucent foreign bodies identified.  Left foot there is noted to be significant irregularity at the hallux interphalangeal joint with osseous erosion and valgus angulation at the hallux interphalangeal joint.  Unable to determine if the irregularity of the joint is related to arthritic changes infection or possible chronic gout. Assessment:   1. 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 Ulcer of left foot, with fat layer exposed ([place])   3. Foreign body in right foot, initial encounter   4. PAD (peripheral  artery disease) ([place])   5. DM type 2 with diabetic peripheral neuropathy ([place])      Plan:  Patient was evaluated and treated and all questions answered.  #Ulcer right plantar forefoot deep to metatarsal joint capsule without obvious evidence of osteomyelitis however cannot rule out at this time.  No evidence of retained foreign body clinically -We discussed the etiology and factors that are a part of the wound healing process.  We also discussed the risk of infection both soft tissue and osteomyelitis from open ulceration.  Discussed the risk of limb loss if this happens or worsens. -Debridement as below. -Dressed with Betadine and dry sterile gauze compression dressing on the right -Continue home dressing changes daily with Betadine soaked gauze packed in the wound and compression dressing  -Continue off-loading with felt padding. -Vascular testing ordered as patient does have diminished DP and PT pulses with tissue loss and poorly controlled type 2 diabetes.  Will consider vascular referral pending results -HgbA1c: 8.1 -Last antibiotics: E Rx for doxycycline 100 mg take twice daily for the next 14 days.  Monitor for worsening of redness swelling or discharge from the wound. -Imaging: x-ray reviewed, shows no signs of erosions, osteolysis, osteomyelitis or emphysema in the right foot.  Questionable erosions in the hallux interphalangeal joint of the left foot will consider MRI at future visit. -Referral to wound care center placed at this visit as I believe patient will benefit from more frequent once weekly wound care debridement and dressing changes. -Answered the questions that the patient had in detail as best I could.  Though I am unsure he fully grasped everything that was told to him at this visit.  He will need continued monitoring for wound care and close follow-up.  He is at risk for tissue loss given chronic ulceration that is very deep especially on the right side.  On the left foot  it is difficult to determine if the chronic ulceration/eschar present there is related to underlying osteomyelitis we will have to consider MRI in the future to evaluate for osteo there and discuss possible treatment options.  Procedure: Excisional Debridement of Wound Rationale: Removal of non-viable soft tissue from the wound to promote healing.  Anesthesia: none Post-Debridement Wound Measurements: 1.5 cm x 1 cm x 1 cm  Type of Debridement: Sharp Excisional Tissue Removed: Non-viable soft tissue Depth of Debridement: subcutaneous tissue. Technique: Sharp excisional debridement to bleeding, viable wound base.  Dressing: Dry, sterile, compression dressing. Disposition: Patient tolerated procedure well.    Return in about 3 weeks (around 01/11/2023) for Follow-up bilateral foot ulcerations.          Everitt Amber, DPM [facility]

## 2022-12-25 ENCOUNTER — Other Ambulatory Visit: Payer: Self-pay | Admitting: Podiatry

## 2022-12-25 DIAGNOSIS — E1142 Type 2 diabetes mellitus with diabetic polyneuropathy: Secondary | ICD-10-CM

## 2022-12-25 DIAGNOSIS — I739 Peripheral vascular disease, unspecified: Secondary | ICD-10-CM

## 2022-12-25 DIAGNOSIS — L97519 Non-pressure chronic ulcer of other part of right foot with unspecified severity: Secondary | ICD-10-CM

## 2022-12-25 DIAGNOSIS — L97513 Non-pressure chronic ulcer of other part of right foot with necrosis of muscle: Secondary | ICD-10-CM

## 2022-12-25 DIAGNOSIS — S90851A Superficial foreign body, right foot, initial encounter: Secondary | ICD-10-CM

## 2022-12-25 DIAGNOSIS — L97522 Non-pressure chronic ulcer of other part of left foot with fat layer exposed: Secondary | ICD-10-CM

## 2022-12-28 ENCOUNTER — Ambulatory Visit (HOSPITAL_COMMUNITY): Payer: Medicare Other

## 2023-01-03 ENCOUNTER — Other Ambulatory Visit: Payer: Self-pay

## 2023-01-03 MED ORDER — GABAPENTIN 300 MG PO CAPS: 300.0000 mg | ORAL_CAPSULE | Freq: Three times a day (TID) | ORAL | 3 refills | Status: DC

## 2023-01-03 NOTE — Telephone Encounter (Signed)
Pt can only take gabapentin or pregabalin, not both

## 2023-01-04 ENCOUNTER — Ambulatory Visit (HOSPITAL_COMMUNITY): Payer: 59

## 2023-01-11 ENCOUNTER — Ambulatory Visit: Payer: 59 | Admitting: Podiatry

## 2023-01-15 ENCOUNTER — Ambulatory Visit (INDEPENDENT_AMBULATORY_CARE_PROVIDER_SITE_OTHER): Payer: 59 | Admitting: Podiatry

## 2023-01-15 ENCOUNTER — Encounter: Payer: Self-pay | Admitting: Podiatry

## 2023-01-15 DIAGNOSIS — E1142 Type 2 diabetes mellitus with diabetic polyneuropathy: Secondary | ICD-10-CM

## 2023-01-15 DIAGNOSIS — L97513 Non-pressure chronic ulcer of other part of right foot with necrosis of muscle: Secondary | ICD-10-CM

## 2023-01-15 DIAGNOSIS — I739 Peripheral vascular disease, unspecified: Secondary | ICD-10-CM

## 2023-01-15 DIAGNOSIS — L97529 Non-pressure chronic ulcer of other part of left foot with unspecified severity: Secondary | ICD-10-CM

## 2023-01-15 MED ORDER — DOXYCYCLINE HYCLATE 100 MG PO TABS: 100.0000 mg | ORAL_TABLET | Freq: Two times a day (BID) | ORAL | 0 refills | Status: AC

## 2023-01-15 NOTE — Progress Notes (Addendum)
ortj  Subjective:  Patient ID: [name], male    DOB: 07-22-1961,   MRN: [id number]  Chief Complaint  Patient presents with   Diabetic Ulcer    rt foot ulcer  (Dr Loel Lofty would like him to see another provider in our office for another opinion...dm..2.22.24) - he also had an ulcerative callus on his left great toe, patient claims that he ran out of gabapentin and tried some hemp lotion on his toe - he thinks the swelling of the toe is an allergic reaction     62 y.o. male presents for concern of bilateral foot ulcers that he has seen Dr. Loel Lofty for about a month ago. Relates about 3 months ago he stepped on a piece of glass at home and that's what initially started the ulceration. He has been dressing as instructed but with neosporin. Relates he applied a hemp oil and started have a reaction on his skin and now the left great toe is red but is improving.  There was concern for blood flow and left hallux wound with overlying eschar and has orders for ABIs placed. He is diabetic and his last A1c was 8.1.    . Denies any other pedal complaints. Denies n/v/f/c.   Past Medical History:  Diagnosis Date   Anxiety    Arthritis    Diabetes mellitus     Objective:  Physical Exam: Vascular: DP/PT pulses 1/4 bilateral. CFT <3 seconds. Normal hair growth on digits. No edema.  Skin. No lacerations or abrasions bilateral feet. Medial left hallus eschar lesion with surrounding erythema and edema noted in the left great toe. Right plantar third metatrsal with wound noted with hyperkeratosis surrounding and granular base about 2 cm x 1 cm x 0.5 cm does not probe to bone. No foreign bodies noted. No puruelnce erythema or edema noted.  Musculoskeletal: MMT 5/5 bilateral lower extremities in DF, PF, Inversion and Eversion. Deceased ROM in DF of ankle joint. No pain to plapation about either foot.  Neurological: Sensation intact to light touch.   Assessment:   1. 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 Ulcer of left foot, unspecified ulcer stage ([place])   3. PAD (peripheral artery disease) ([place])   4. DM type 2 with diabetic peripheral neuropathy ([place])      Plan:  Patient was evaluated and treated and all questions answered. Ulcer left hallux un stageable. Ulcer right plantar foot with necrosis of muscle.  -Reviewed radiographs, No osseous erosions noted in the right foot. There is erosive vs possible degenerative chagnes noted in the hallux IPJ on the left. Concern for possible osteo vs gout and arthritic changes  -No debridement today with concern for blood flow.  -Dressed with betadine, DSD. -Off-loading with surgical shoe. Dispensed.  -Doxycycline refilled x 14 days.  -Awaiting ABIs.  -Agree with wound care referral. Following up next weeks -Discussed glucose control and proper protein-rich diet.  -Discussed if any worsening redness, pain, fever or chills to call or may need to report to the emergency room. Patient expressed understanding.  -Discussed with patient that he is at risk for limb vs tissue loss given chronic ulcerations and possible osteomyelitis. Patient will get ABIs scheduled as this is the first step. Patient with a  lot of anxiety and story changes as to how this occurred so unsure of how much he understands but discussed several times risks of ulceration and needing to make sure blood flow is adequate.  Return in about 2 weeks (around 01/29/2023) for wound check.       Lorenda Peck, DPM

## 2023-01-17 ENCOUNTER — Other Ambulatory Visit (HOSPITAL_BASED_OUTPATIENT_CLINIC_OR_DEPARTMENT_OTHER): Payer: Self-pay | Admitting: General Surgery

## 2023-01-17 ENCOUNTER — Encounter (HOSPITAL_BASED_OUTPATIENT_CLINIC_OR_DEPARTMENT_OTHER): Payer: 59 | Attending: General Surgery | Admitting: General Surgery

## 2023-01-17 DIAGNOSIS — L89896 Pressure-induced deep tissue damage of other site: Secondary | ICD-10-CM | POA: Insufficient documentation

## 2023-01-17 DIAGNOSIS — I10 Essential (primary) hypertension: Secondary | ICD-10-CM | POA: Insufficient documentation

## 2023-01-17 DIAGNOSIS — L97513 Non-pressure chronic ulcer of other part of right foot with necrosis of muscle: Secondary | ICD-10-CM | POA: Diagnosis not present

## 2023-01-17 DIAGNOSIS — L84 Corns and callosities: Secondary | ICD-10-CM | POA: Insufficient documentation

## 2023-01-17 DIAGNOSIS — E11621 Type 2 diabetes mellitus with foot ulcer: Secondary | ICD-10-CM | POA: Insufficient documentation

## 2023-01-17 DIAGNOSIS — M109 Gout, unspecified: Secondary | ICD-10-CM | POA: Diagnosis not present

## 2023-01-17 DIAGNOSIS — L989 Disorder of the skin and subcutaneous tissue, unspecified: Secondary | ICD-10-CM | POA: Insufficient documentation

## 2023-01-19 NOTE — Progress Notes (Signed)
[name] ([id number]) [id number] Page 1 of 4 Visit Report for 01/17/2023 Abuse Risk Screen Details Patient Name: Date of Service: [name], DO NA LD 01/17/2023 12:30 PM Medical Record Number: [id number] Patient Account Number: [id number] Date of Birth/Sex: Treating RN: 1961-03-18 (62 y.o. Janyth Contes Primary Care Yaquelin Langelier: Hiram Comber Other Clinician: Referring Hollyann Pablo: Treating Orestes Geiman/Extender: [place] Weeks in Treatment: 0 Abuse Risk Screen Items Answer ABUSE RISK SCREEN: Has anyone close to you tried to hurt or harm you recentlyo No Do you feel uncomfortable with anyone in your familyo No Has anyone forced you do things that you didnt want to doo No Electronic Signature(s) Signed: 01/17/2023 4:53:16 PM By: Adline Peals Entered By: Adline Peals on 01/17/2023 12:42:28 -------------------------------------------------------------------------------- Activities of Daily Living Details Patient Name: Date of Service: [name], DO NA LD 01/17/2023 12:30 PM Medical Record Number: [id number] Patient Account Number: [id number] Date of Birth/Sex: Treating RN: 01/20/1961 (62 y.o. Janyth Contes Primary Care London Tarnowski: Hiram Comber Other Clinician: Referring Maleyah Evans: Treating Ruthellen Tippy/Extender: [place] Weeks in Treatment: 0 Activities of Daily Living Items Answer Activities of Daily Living (Please select one for each item) Drive Automobile Not Able T Medications ake Completely Able Use T elephone Completely Able Care for Appearance Completely Able Use T oilet Completely Able Bath / Shower Completely Able Dress Self Completely Able Feed Self Completely Able Walk Completely Able Get In / Out Bed Completely Able Housework Completely Able Prepare Meals Completely [place] for Self Completely Able Electronic  Signature(s) Signed: 01/17/2023 4:53:16 PM By: Adline Peals Entered By: Adline Peals on 01/17/2023 12:42:44 -------------------------------------------------------------------------------- Education Screening Details Patient Name: Date of Service: Daniel Picking, DO NA LD 01/17/2023 12:30 PM Medical Record Number: [id number] Patient Account Number: [id number] Date of Birth/Sex: Treating RN: 15-Feb-1961 (62 y.o. Janyth Contes Primary Care Axton Cihlar: Hiram Comber Other Clinician: Referring Konrad Hoak: Treating Siera Beyersdorf/Extender: [place] Weeks in Treatment: 0 [name] ([id number]) [id number] Page 2 of 4 Primary Learner Assessed: Patient Learning Preferences/Education Level/Primary Language Learning Preference: Explanation, Demonstration, Video, Printed Material Highest Education Level: College or Above Preferred Language: Diplomatic Services operational officer Language Barrier: No Translator Needed: No Memory Deficit: No Emotional Barrier: No Cultural/Religious Beliefs Affecting Medical Care: No Physical Barrier Impaired Vision: Yes Glasses Impaired Hearing: No Decreased Hand dexterity: No Knowledge/Comprehension Knowledge Level: Medium Comprehension Level: Medium Ability to understand written instructions: Medium Ability to understand verbal instructions: Medium Motivation Anxiety Level: Calm Cooperation: Cooperative Education Importance: Acknowledges Need Interest in Health Problems: Asks Questions Perception: Coherent Willingness to Engage in Self-Management Medium Activities: Readiness to Engage in Self-Management Medium Activities: Electronic Signature(s) Signed: 01/17/2023 4:53:16 PM By: Adline Peals Entered By: Adline Peals on 01/17/2023 12:43:06 -------------------------------------------------------------------------------- Fall Risk Assessment Details Patient Name: Date of  Service: [name], DO NA LD 01/17/2023 12:30 PM Medical Record Number: [id number] Patient Account Number: [id number] Date of Birth/Sex: Treating RN: 06-05-1961 (62 y.o. Janyth Contes Primary Care Elsey Holts: Hiram Comber Other Clinician: Referring [name]: Treating Indya Oliveria/Extender: Bil[place] Weeks in Treatment: 0 Fall Risk Assessment Items Have you had 2 or more falls in the last 12 monthso 0 No Have you had any fall that resulted in injury in the last 12 monthso 0 No FALLS RISK SCREEN History of falling - immediate or within 3 months 0 No Secondary diagnosis (Do you have 2 or more medical diagnoseso) 15 Yes Ambulatory aid None/bed rest/wheelchair/nurse 0 Yes Crutches/cane/walker 0 No Furniture 0 No Intravenous therapy Access/Saline/Heparin Lock 0 No  Gait/Transferring Normal/ bed rest/ wheelchair 0 Yes Weak (short steps with or without shuffle, stooped but able to lift head while walking, may seek 0 No support from furniture) Impaired (short steps with shuffle, may have difficulty arising from chair, head down, impaired 0 No balance) Mental Status Oriented to own ability 0 Yes Overestimates or forgets limitations 0 No Risk Level: Low Risk Score: 15 [name], [name] ([id number]) [phone] [id number] Page 3 of 4 Electronic Signature(s) -------------------------------------------------------------------------------- Foot Assessment Details Patient Name: Date of Service: [name], DO NA LD 01/17/2023 12:30 PM Medical Record Number: [id number] Patient Account Number: [id number] Date of Birth/Sex: Treating RN: 24-Jul-1961 (62 y.o. Janyth Contes Primary Care Taegan Haider: Hiram Comber Other Clinician: Referring Gianni Mihalik: Treating Keyari Kleeman/Extender: [place] Weeks in Treatment: 0 Foot Assessment Items Site Locations + = Sensation present, - = Sensation absent, C = Callus, U = Ulcer R =  Redness, W = Warmth, M = Maceration, PU = Pre-ulcerative lesion F = Fissure, S = Swelling, D = Dryness Assessment Right: Left: Other Deformity: No No Prior Foot Ulcer: No No Prior Amputation: No No Charcot Joint: No No Ambulatory Status: Ambulatory Without Help Gait: Steady Electronic Signature(s) Signed: 01/17/2023 4:53:16 PM By: Adline Peals Entered By: Adline Peals on 01/17/2023 12:51:07 -------------------------------------------------------------------------------- Nutrition Risk Screening Details Patient Name: Date of Service: Bruns, DO NA LD 01/17/2023 12:30 PM Medical Record Number: [id number] Patient Account Number: [id number] Date of Birth/Sex: Treating RN: 22-Nov-1960 (62 y.o. Janyth Contes Primary Care Kimberl Vig: Hiram Comber Other Clinician: Referring Shilo Pauwels: Treating Maziah Smola/Extender: [place] Weeks in Treatment: 0 Height (in): 72 Weight (lbs): 220 Body Mass Index (BMI): 29.8 [name], [name] ([id number]) [phone] [id number] Page 4 of 4 Nutrition Risk Screening Items Score Screening NUTRITION RISK SCREEN: I have an illness or condition that made me change the kind and/or amount of food I eat 0 No I eat fewer than two meals per day 0 No I eat few fruits and vegetables, or milk products 0 No I have three or more drinks of beer, liquor or wine almost every day 0 No I have tooth or mouth problems that make it hard for me to eat 0 No I don't always have enough money to buy the food I need 0 No I eat alone most of the time 0 No I take three or more different prescribed or over-the-counter drugs a day 1 Yes Without wanting to, I have lost or gained 10 pounds in the last six months 0 No I am not always physically able to shop, cook and/or feed myself 0 No Nutrition Protocols Good Risk Protocol 0 No interventions needed Moderate Risk Protocol High Risk Proctocol Risk Level: Good Risk Score:  1 Electronic Signature(s) Signed: 01/17/2023 4:53:16 PM By: Adline Peals Entered By: Adline Peals on 01/17/2023 12:43:49

## 2023-01-19 NOTE — Progress Notes (Signed)
SAMEH, PERI ([id number]) [id number] Page 1 of 9 Visit Report for 01/17/2023 Chief Complaint Document Details Patient Name: Date of Service: [name] 01/17/2023 12:30 PM Medical Record Number: [id number] Patient Account Number: [id number] Date of Birth/Sex: Treating RN: Mar 16, 1961 (62 y.o. M) Primary Care Provider: Hiram Comber Other Clinician: Referring Provider: Treating Provider/Extender: Billey Co Weeks in Treatment: 0 Information Obtained from: Patient Chief Complaint Patients presents for treatment of an open diabetic ulcer Electronic Signature(s) Signed: 01/17/2023 3:52:05 PM By: Fredirick Maudlin MD FACS Previous Signature: 01/17/2023 12:37:39 PM Version By: Fredirick Maudlin MD FACS Entered By: Fredirick Maudlin on 01/17/2023 15:52:05 -------------------------------------------------------------------------------- Debridement Details Patient Name: Date of Service: [name] 01/17/2023 12:30 PM Medical Record Number: [id number] Patient Account Number: [id number] Date of Birth/Sex: Treating RN: 1961/07/08 (62 y.o. Janyth Contes Primary Care Provider: Hiram Comber Other Clinician: Referring Provider: Treating Provider/Extender: Billey Co Weeks in Treatment: 0 Debridement Performed for Assessment: Wound #1 Right Metatarsal head fourth Performed By: Physician Fredirick Maudlin, MD Debridement Type: Debridement Severity of Tissue Pre Debridement: Fat layer exposed Level of Consciousness (Pre-procedure): Awake and Alert Pre-procedure Verification/Time Out Yes - 13:23 Taken: Start Time: 13:23 Pain Control: Lidocaine 4% T opical Solution T Area Debrided (L x W): otal 1.7 (cm) x 0.7 (cm) = 1.19 (cm) Tissue and other material debrided: Non-Viable, Callus, Slough, Slough Level: Non-Viable Tissue Debridement Description: Selective/Open Wound Instrument: Curette Bleeding:  Minimum Hemostasis Achieved: Pressure Response to Treatment: Procedure was tolerated well Level of Consciousness (Post- Awake and Alert procedure): Post Debridement Measurements of Total Wound Length: (cm) 1.7 Width: (cm) 0.7 Depth: (cm) 1 Volume: (cm) 0.935 Character of Wound/Ulcer Post Debridement: Improved Severity of Tissue Post Debridement: Fat layer exposed Post Procedure Diagnosis Same as Pre-procedure Notes scribed for Dr. Celine Ahr by Adline Peals, RN Electronic Signature(s) Signed: 01/17/2023 4:53:16 PM By: Adline Peals Signed: 01/17/2023 5:27:13 PM By: Fredirick Maudlin MD FACS [name] ([id number]) [id number] Page 2 of 9 Entered By: Adline Peals on 01/17/2023 13:26:52 -------------------------------------------------------------------------------- HPI Details Patient Name: Date of Service: [name] 01/17/2023 12:30 PM Medical Record Number: [id number] Patient Account Number: [id number] Date of Birth/Sex: Treating RN: Oct 19, 1961 (62 y.o. M) Primary Care Provider: Hiram Comber Other Clinician: Referring Provider: Treating Provider/Extender: Billey Co Weeks in Treatment: 0 History of Present Illness HPI Description: ADMISSION This is a 62 year old poorly controlled type II diabetic (last hemoglobin A1c 8.1% in September 2023, historically much higher). He has been followed by podiatry for a number of foot ulcers over the past year. He has not yet required any amputations. He currently has a right plantar foot ulcer in the forefoot that he states was secondary to stepping on a piece of glass while walking barefoot in his home. This apparently initially extended into the muscle layer. He also has a chronic area on his medial left great toe. X-rays taken in the podiatry clinic on 8 February were interpreted as follows: Date: 12/21/2022 XR bilateral foot weightbearing  AP/Lateral/Oblique Findings: Attention directed on the right foot metatarsal heads of the second through fourth metatarsal there is no obvious erosion or osteolysis to suspect osteomyelitis. No soft tissue emphysema is noted. No radiolucent foreign bodies identified. Left foot there is noted to be significant irregularity at the hallux interphalangeal joint with osseous erosion and valgus angulation at the hallux interphalangeal joint. Unable to determine if the irregularity of the joint is related to arthritic changes infection or possible chronic gout. A 14-day course of  doxycycline was prescribed and he was referred to the wound care center. He saw podiatry again on March 4. Formal ABIs were ordered. These are scheduled for March 14. On exam, there is a deep ulcer on his right midfoot. It is quite clean but there is some slough on the surface and callus accumulation around the perimeter. On the medial left great toe, there is a scaly hyperkeratotic lesion that frankly resembles a bizarre wart. On the left second toe, there is a deep pink circular area that does not have any actual skin breakdown but looks susceptible to such. The left great toe is also bright red, hot, and swollen. He has significant neuropathy, however, and it is not painful. Electronic Signature(s) Signed: 01/17/2023 4:07:21 PM By: Fredirick Maudlin MD FACS Previous Signature: 01/17/2023 12:47:07 PM Version By: Fredirick Maudlin MD FACS Entered By: Fredirick Maudlin on 01/17/2023 16:07:21 -------------------------------------------------------------------------------- Paring/cutting 1 benign hyperkeratotic lesion Details Patient Name: Date of Service: [name] 01/17/2023 12:30 PM Medical Record Number: [id number] Patient Account Number: [id number] Date of Birth/Sex: Treating RN: 1961/03/21 (62 y.o. Janyth Contes Primary Care Provider: Hiram Comber Other Clinician: Referring Provider: Treating  Provider/Extender: Billey Co Weeks in Treatment: 0 Procedure Performed for: NonWound Condition Other Dermatologic Condition - Left Foot Performed By: Physician Fredirick Maudlin, MD Post Procedure Diagnosis Same as Pre-procedure Notes scribed for Dr. Celine Ahr by Adline Peals, RN Electronic Signature(s) Signed: 01/17/2023 4:53:16 PM By: Adline Peals Signed: 01/17/2023 5:27:13 PM By: Fredirick Maudlin MD FACS Entered By: Adline Peals on 01/17/2023 13:37:21 -------------------------------------------------------------------------------- Physical Exam Details Patient Name: Date of Service: [name] 01/17/2023 12:30 PM Medical Record Number: [id number] Patient Account Number: [id number] [name] ([id number]) [id number] Page 3 of 9 Date of Birth/Sex: Treating RN: 11-Jun-1961 (62 y.o. M) Primary Care Provider: Other Clinician: Hiram Comber Referring Provider: Treating Provider/Extender: Jess Barters, Irma Weeks in Treatment: 0 Constitutional Hypertensive, asymptomatic. Slightly tachycardic. . . No acute distress. Respiratory Normal work of breathing on room air. Notes 01/17/2023: There is a deep ulcer on his right midfoot. It is quite clean but there is some slough on the surface and callus accumulation around the perimeter. On the medial left great toe, there is a scaly hyperkeratotic lesion that frankly resembles a bizarre wart. On the left second toe, there is a deep pink circular area that does not have any actual skin breakdown but looks susceptible to such. The left great toe is also bright red, hot, and swollen. He has significant neuropathy, however, and it is not painful. Electronic Signature(s) Signed: 01/17/2023 4:10:26 PM By: Fredirick Maudlin MD FACS Entered By: Fredirick Maudlin on 01/17/2023  16:10:26 -------------------------------------------------------------------------------- Physician Orders Details Patient Name: Date of Service: [name] 01/17/2023 12:30 PM Medical Record Number: [id number] Patient Account Number: [id number] Date of Birth/Sex: Treating RN: [birth year] (62 y.o. Janyth Contes Primary Care Provider: Hiram Comber Other Clinician: Referring Provider: Treating Provider/Extender: Billey Co Weeks in Treatment: 0 Verbal / Phone Orders: No Diagnosis Coding ICD-10 Coding Code Description L97.513 Non-pressure chronic ulcer of other part of right foot with necrosis of muscle L98.9 Disorder of the skin and subcutaneous tissue, unspecified L89.896 Pressure-induced deep tissue damage of other site E11.621 Type 2 diabetes mellitus with foot ulcer I10 Essential (primary) hypertension M10.9 Gout, unspecified Follow-up Appointments ppointment in 1 week. - Dr. Celine Ahr - room 2 Return A Anesthetic (In clinic) Topical Lidocaine 4% applied to wound bed Bathing/ Shower/ Hygiene May shower and wash wound with  soap and water. Off-Loading Other: - stay off feet as much as possible Non Wound Condition Protect area with: - foam doughnuts/callus pads Wound Treatment Wound #1 - Metatarsal head fourth Wound Laterality: Right Cleanser: Soap and Water 1 x Per Day/30 Days Discharge Instructions: May shower and wash wound with dial antibacterial soap and water prior to dressing change. Cleanser: Wound Cleanser 1 x Per Day/30 Days Discharge Instructions: Cleanse the wound with wound cleanser prior to applying a clean dressing using gauze sponges, not tissue or cotton balls. Prim Dressing: Maxorb Extra Ag+ Alginate Dressing, 2x2 (in/in) 1 x Per Day/30 Days ary Discharge Instructions: Apply to wound bed as instructed Secondary Dressing: Optifoam Non-Adhesive Dressing, 4x4 in 1 x Per Day/30 Days Discharge Instructions: Apply over  primary dressing as directed. BLESSED, ANDERER ([id number]) [id number] Page 4 of 9 Secondary Dressing: Woven Gauze Sponge, Non-Sterile 4x4 in 1 x Per Day/30 Days Discharge Instructions: Apply over primary dressing as directed. Secured With: 4M Medipore H Soft Cloth Surgical T ape, 4 x 10 (in/yd) 1 x Per Day/30 Days Discharge Instructions: Secure with tape as directed. Patient Medications llergies: No Known Allergies A Notifications Medication Indication Start End 01/17/2023 lidocaine DOSE topical 4 % cream - cream topical 01/17/2023 colchicine DOSE oral 0.6 mg tablet - 2 tablets p.o. x 1, wait 1 hour then take 1 tablet p.o. Wait 12 hours and begin taking 1 tablet p.o. twice daily Electronic Signature(s) Signed: 01/17/2023 4:32:16 PM By: Fredirick Maudlin MD FACS Entered By: Fredirick Maudlin on 01/17/2023 16:32:16 -------------------------------------------------------------------------------- Problem List Details Patient Name: Date of Service: [place]ren[place], [name] 01/17/2023 12:30 PM Medical Record Number: [id number] Patient Account Number: [id number] Date of Birth/Sex: Treating RN: 05/27/1961 (62 y.o. M) Primary Care Provider: Hiram Comber Other Clinician: Referring Provider: Treating Provider/Extender: Billey Co Weeks in Treatment: 0 Active Problems ICD-10 Encounter Code Description Active Date MDM Diagnosis L97.513 Non-pressure chronic ulcer of other part of right foot with necrosis of muscle 01/17/2023 No Yes L98.9 Disorder of the skin and subcutaneous tissue, unspecified 01/17/2023 No Yes L89.896 Pressure-induced deep tissue damage of other site 01/17/2023 No Yes E11.621 Type 2 diabetes mellitus with foot ulcer 01/17/2023 No Yes I10 Essential (primary) hypertension 01/17/2023 No Yes M10.9 Gout, unspecified 01/17/2023 No Yes Inactive Problems Resolved Problems Electronic Signature(s) Signed: 01/17/2023 3:51:33 PM By: Fredirick Maudlin MD  FACS Previous Signature: 01/17/2023 3:45:55 PM Version By: Fredirick Maudlin MD FACS [name] ([id number]) [id number] Page 5 of 9 Previous Signature: 01/17/2023 3:45:55 PM Version By: Fredirick Maudlin MD FACS Previous Signature: 01/17/2023 3:16:43 PM Version By: Fredirick Maudlin MD FACS Previous Signature: 01/17/2023 12:37:21 PM Version By: Fredirick Maudlin MD FACS Entered By: Fredirick Maudlin on 01/17/2023 15:51:33 -------------------------------------------------------------------------------- Progress Note Details Patient Name: Date of Service: [name] 01/17/2023 12:30 PM Medical Record Number: [id number] Patient Account Number: [id number] Date of Birth/Sex: Treating RN: 1961-08-12 (62 y.o. M) Primary Care Provider: Hiram Comber Other Clinician: Referring Provider: Treating Provider/Extender: Billey Co Weeks in Treatment: 0 Subjective Chief Complaint Information obtained from Patient Patients presents for treatment of an open diabetic ulcer History of Present Illness (HPI) ADMISSION This is a 62 year old poorly controlled type II diabetic (last hemoglobin A1c 8.1% in September 2023, historically much higher). He has been followed by podiatry for a number of foot ulcers over the past year. He has not yet required any amputations. He currently has a right plantar foot ulcer in the forefoot that he states was secondary to stepping on a piece of  glass while walking barefoot in his home. This apparently initially extended into the muscle layer. He also has a chronic area on his medial left great toe. X-rays taken in the podiatry clinic on 8 February were interpreted as follows: Date: 12/21/2022 XR bilateral foot weightbearing AP/Lateral/Oblique Findings: Attention directed on the right foot metatarsal heads of the second through fourth metatarsal there is no obvious erosion or osteolysis to suspect osteomyelitis. No soft tissue  emphysema is noted. No radiolucent foreign bodies identified. Left foot there is noted to be significant irregularity at the hallux interphalangeal joint with osseous erosion and valgus angulation at the hallux interphalangeal joint. Unable to determine if the irregularity of the joint is related to arthritic changes infection or possible chronic gout. A 14-day course of doxycycline was prescribed and he was referred to the wound care center. He saw podiatry again on March 4. Formal ABIs were ordered. These are scheduled for March 14. On exam, there is a deep ulcer on his right midfoot. It is quite clean but there is some slough on the surface and callus accumulation around the perimeter. On the medial left great toe, there is a scaly hyperkeratotic lesion that frankly resembles a bizarre wart. On the left second toe, there is a deep pink circular area that does not have any actual skin breakdown but looks susceptible to such. The left great toe is also bright red, hot, and swollen. He has significant neuropathy, however, and it is not painful. Patient History Information obtained from Patient. Allergies No Known Allergies Family History Cancer - Mother, Diabetes - Mother, Heart Disease - Father, Hypertension - Mother, No family history of Kidney Disease, Lung Disease, Seizures, Stroke, Thyroid Problems, Tuberculosis. Social History Former smoker, Marital Status - Single, Alcohol Use - Never, Drug Use - No History, Caffeine Use - Daily. Medical History Cardiovascular Patient has history of Hypertension Endocrine Patient has history of Type II Diabetes Musculoskeletal Patient has history of Osteoarthritis Neurologic Patient has history of Neuropathy Patient is treated with Oral Agents. Blood sugar is tested. Hospitalization/Surgery History - varicose vein surgery. Medical A Surgical History Notes nd Gastrointestinal GERD Psychiatric anxiety, depression Review of Systems  (ROS) Constitutional Symptoms (General Health) Denies complaints or symptoms of Fatigue, Fever, Chills, Marked Weight Change. Eyes Complains or has symptoms of Glasses / Contacts. Denies complaints or symptoms of Dry Eyes, Vision Changes. Ear/Nose/Mouth/Throat [name] ([id number]) [id number] Page 6 of 9 Denies complaints or symptoms of Chronic sinus problems or rhinitis. Respiratory Denies complaints or symptoms of Chronic or frequent coughs, Shortness of Breath. Gastrointestinal Denies complaints or symptoms of Frequent diarrhea, Nausea, Vomiting. Genitourinary Denies complaints or symptoms of Frequent urination. Integumentary (Skin) Complains or has symptoms of Wounds. Neurologic Denies complaints or symptoms of Numbness/parasthesias. Objective Constitutional Hypertensive, asymptomatic. Slightly tachycardic. No acute distress. Vitals Time Taken: 12:35 PM, Height: 72 in, Source: Stated, Weight: 220 lbs, Source: Stated, BMI: 29.8, Temperature: 98.1 F, Pulse: 107 bpm, Respiratory Rate: 18 breaths/min, Blood Pressure: 157/73 mmHg, Capillary Blood Glucose: 200 mg/dl. Respiratory Normal work of breathing on room air. General Notes: 01/17/2023: There is a deep ulcer on his right midfoot. It is quite clean but there is some slough on the surface and callus accumulation around the perimeter. On the medial left great toe, there is a scaly hyperkeratotic lesion that frankly resembles a bizarre wart. On the left second toe, there is a deep pink circular area that does not have any actual skin breakdown but looks susceptible to such. The left great  toe is also bright red, hot, and swollen. He has significant neuropathy, however, and it is not painful. Integumentary (Hair, Skin) Wound #1 status is Open. Original cause of wound was Trauma. The date acquired was: 11/13/2022. The wound is located on the Right Metatarsal head fourth. The wound measures 1.7cm length x  0.7cm width x 1cm depth; 0.935cm^2 area and 0.935cm^3 volume. There is Fat Layer (Subcutaneous Tissue) exposed. There is no tunneling or undermining noted. There is a medium amount of serosanguineous drainage noted. The wound margin is distinct with the outline attached to the wound base. There is large (67-100%) red, pink granulation within the wound bed. There is a small (1-33%) amount of necrotic tissue within the wound bed including Adherent Slough. The periwound skin appearance had no abnormalities noted for moisture. The periwound skin appearance had no abnormalities noted for color. The periwound skin appearance exhibited: Callus. Periwound temperature was noted as No Abnormality. Assessment Active Problems ICD-10 Non-pressure chronic ulcer of other part of right foot with necrosis of muscle Disorder of the skin and subcutaneous tissue, unspecified Pressure-induced deep tissue damage of other site Type 2 diabetes mellitus with foot ulcer Essential (primary) hypertension Gout, unspecified Procedures Wound #1 Pre-procedure diagnosis of Wound #1 is a Diabetic Wound/Ulcer of the Lower Extremity located on the Right Metatarsal head fourth .Severity of Tissue Pre Debridement is: Fat layer exposed. There was a Selective/Open Wound Non-Viable Tissue Debridement with a total area of 1.19 sq cm performed by Fredirick Maudlin, MD. With the following instrument(s): Curette to remove Non-Viable tissue/material. Material removed includes Callus and Slough and after achieving pain control using Lidocaine 4% T opical Solution. No specimens were taken. A time out was conducted at 13:23, prior to the start of the procedure. A Minimum amount of bleeding was controlled with Pressure. The procedure was tolerated well. Post Debridement Measurements: 1.7cm length x 0.7cm width x 1cm depth; 0.935cm^3 volume. Character of Wound/Ulcer Post Debridement is improved. Severity of Tissue Post Debridement is: Fat layer  exposed. Post procedure Diagnosis Wound #1: Same as Pre-Procedure General Notes: scribed for Dr. Celine Ahr by Adline Peals, RN. A Paring/cutting 1 benign hyperkeratotic lesion procedure was performed. by Fredirick Maudlin, MD. Post procedure Diagnosis Wound #: Same as Pre-Procedure Notes: scribed for Dr. Celine Ahr by Adline Peals, RN [facility] [place], [place] ([id number]) [id number] Page 7 of 9 Follow-up Appointments: Return Appointment in 1 week. - Dr. Celine Ahr - room 2 Anesthetic: (In clinic) Topical Lidocaine 4% applied to wound bed Bathing/ Shower/ Hygiene: May shower and wash wound with soap and water. Off-Loading: Other: - stay off feet as much as possible Non Wound Condition: Protect area with: - foam doughnuts/callus pads The following medication(s) was prescribed: lidocaine topical 4 % cream cream topical was prescribed at facility colchicine oral 0.6 mg tablet 2 tablets p.o. x 1, wait 1 hour then take 1 tablet p.o. Wait 12 hours and begin taking 1 tablet p.o. twice daily starting 01/17/2023 WOUND #1: - Metatarsal head fourth Wound Laterality: Right Cleanser: Soap and Water 1 x Per Day/30 Days Discharge Instructions: May shower and wash wound with dial antibacterial soap and water prior to dressing change. Cleanser: Wound Cleanser 1 x Per Day/30 Days Discharge Instructions: Cleanse the wound with wound cleanser prior to applying a clean dressing using gauze sponges, not tissue or cotton balls. Prim Dressing: Maxorb Extra Ag+ Alginate Dressing, 2x2 (in/in) 1 x Per Day/30 Days ary Discharge Instructions: Apply to wound bed as instructed Secondary Dressing: Optifoam Non-Adhesive Dressing, 4x4 in 1  x Per Day/30 Days Discharge Instructions: Apply over primary dressing as directed. Secondary Dressing: Woven Gauze Sponge, Non-Sterile 4x4 in 1 x Per Day/30 Days Discharge Instructions: Apply over primary dressing as directed. Secured With: 43M Medipore H Soft Cloth  Surgical T ape, 4 x 10 (in/yd) 1 x Per Day/30 Days Discharge Instructions: Secure with tape as directed. 01/17/2023: There is a deep ulcer on his right midfoot. It is quite clean but there is some slough on the surface and callus accumulation around the perimeter. On the medial left great toe, there is a scaly hyperkeratotic lesion that frankly resembles a bizarre wart. On the left second toe, there is a deep pink circular area that does not have any actual skin breakdown but looks susceptible to such. The left great toe is also bright red, hot, and swollen. He has significant neuropathy, however, and it is not painful. I used a curette to debride callus and slough from the right midfoot ulcer. I used a scalpel to excise the scaly hyperkeratotic lesion off of his medial left great toe. There is actually no underlying ulcer. After further questioning, he says that he has had this lesion grow and fall off, grow and fall off multiple times in the past. This furthers my suspicion that it may be a wart. I sent the lesion for pathology. We are going to pack the midfoot ulcer with silver alginate and use a foam donut for offloading. I am going to also use foam donut's to help offload his left second toe to prevent the area of pressure induced tissue injury from evolving into an actual wound. As prior x-rays have suggested the possibility of gout and the clinical scenario is also consistent, I am going to go ahead and prescribe colchicine therapy. He will follow-up here in 1 week. Electronic Signature(s) Signed: 01/17/2023 4:32:48 PM By: Fredirick Maudlin MD FACS Previous Signature: 01/17/2023 4:25:46 PM Version By: Fredirick Maudlin MD FACS Entered By: Fredirick Maudlin on 01/17/2023 16:32:48 -------------------------------------------------------------------------------- HxROS Details Patient Name: Date of Service: [name] 01/17/2023 12:30 PM Medical Record Number: [id number] Patient Account Number:  [id number] Date of Birth/Sex: Treating RN: 05/29/1961 (62 y.o. Janyth Contes Primary Care Provider: Hiram Comber Other Clinician: Referring Provider: Treating Provider/Extender: Billey Co Weeks in Treatment: 0 Information Obtained From Patient Constitutional Symptoms (General Health) Complaints and Symptoms: Negative for: Fatigue; Fever; Chills; Marked Weight Change Eyes Complaints and Symptoms: Positive for: Glasses / Contacts Negative for: Dry Eyes; Vision Changes Ear/Nose/Mouth/Throat Complaints and Symptoms: Negative for: Chronic sinus problems or rhinitis Respiratory [name] ([id number]) [id number] Page 8 of 9 Complaints and Symptoms: Negative for: Chronic or frequent coughs; Shortness of Breath Gastrointestinal Complaints and Symptoms: Negative for: Frequent diarrhea; Nausea; Vomiting Medical History: Past Medical History Notes: GERD Genitourinary Complaints and Symptoms: Negative for: Frequent urination Integumentary (Skin) Complaints and Symptoms: Positive for: Wounds Neurologic Complaints and Symptoms: Negative for: Numbness/parasthesias Medical History: Positive for: Neuropathy Hematologic/Lymphatic Cardiovascular Medical History: Positive for: Hypertension Endocrine Medical History: Positive for: Type II Diabetes Time with diabetes: 10 yrs Treated with: Oral agents Blood sugar tested every day: Yes Tested : x3 Immunological Musculoskeletal Medical History: Positive for: Osteoarthritis Oncologic Psychiatric Medical History: Past Medical History Notes: anxiety, depression Immunizations Pneumococcal Vaccine: Received Pneumococcal Vaccination: No Implantable Devices None Hospitalization / Surgery History Type of Hospitalization/Surgery varicose vein surgery Family and Social History Cancer: Yes - Mother; Diabetes: Yes - Mother; Heart Disease: Yes - Father;  Hypertension: Yes - Mother; Kidney Disease: No; Lung  Disease: No; Seizures: No; Stroke: No; Thyroid Problems: No; Tuberculosis: No; Former smoker; Marital Status - Single; Alcohol Use: Never; Drug Use: No History; Caffeine Use: Daily; Financial Concerns: No; Food, Clothing or Shelter Needs: No; Support System Lacking: No; Transportation Concerns: No Melecio, Elenore Rota ([id number]) [id number] Page 9 of 9 Electronic Signature(s) Signed: 01/17/2023 12:55:37 PM By: Fredirick Maudlin MD FACS Signed: 01/17/2023 4:53:16 PM By: Sabas Sous By: Adline Peals on 01/17/2023 12:44:06 -------------------------------------------------------------------------------- SuperBill Details Patient Name: Date of Service: [name] 01/17/2023 Medical Record Number: [id number] Patient Account Number: [id number] Date of Birth/Sex: Treating RN: 10-25-1961 (62 y.o. 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 L98.9 Disorder of the skin and subcutaneous tissue, unspecified L89.896 Pressure-induced deep tissue damage of other site E11.621 Type 2 diabetes mellitus with foot ulcer I10 Essential (primary) hypertension M10.9 Gout, unspecified Facility Procedures : CPT4 Code: [id number] Description: [id number] - WOUND CARE VISIT-LEV 3 EST PT Modifier: 25 Quantity: 1 : CPT4 Code: [id number] Description: [id number] - DEBRIDE WOUND 1ST 20 SQ CM OR < ICD-10 Diagnosis Description L97.513 Non-pressure chronic ulcer of other part of right foot with necrosis of muscle Modifier: Quantity: 1 : CPT4 Code: [id number] Description: [id number] - PARE BENIGN LES; SGL ICD-10 Diagnosis Description L98.9 Disorder of the skin and subcutaneous tissue,  unspecified Modifier: Quantity: 1 Physician Procedures : CPT4 Code Description Modifier [id number] - WC PHYS LEVEL 4 - EST PT 25 ICD-10 Diagnosis Description L97.513 Non-pressure chronic ulcer of other part of right foot with necrosis of muscle L98.9 Disorder of the skin and subcutaneous tissue,  unspecified L89.896 Pressure-induced deep tissue damage of other site M10.9 Gout, unspecified Quantity: 1 : [id number] - WC PHYS DEBR WO ANESTH 20 SQ CM ICD-10 Diagnosis Description L97.513 Non-pressure chronic ulcer of other part of right foot with necrosis of muscle Quantity: 1 : [id number] - WC PHYS PARE BENIGN LES; SGL ICD-10 Diagnosis Description L98.9 Disorder of the skin and subcutaneous tissue, unspecified Quantity: 1 Electronic Signature(s) Signed: 01/19/2023 7:58:56 AM By: Fredirick Maudlin MD FACS Signed: 01/19/2023 4:02:58 PM By: Adline Peals Previous Signature: 01/17/2023 4:33:21 PM Version By: Fredirick Maudlin MD FACS Entered By: Adline Peals on 01/19/2023 07:35:55

## 2023-01-19 NOTE — Progress Notes (Signed)
[name], [name] ([id number]) [id number] Page 1 of 8 Visit Report for 01/17/2023 Allergy List Details Patient Name: Date of Service: Down, DO NA LD 01/17/2023 12:30 PM Medical Record Number: [id number] Patient Account Number: [id number] Date of Birth/Sex: Treating RN: 12/19/1960 (63 y.o. [name] Primary Care [name]: [name] Other Clinician: Referring [name]: Treating [name]/Extender: [name], [name] in Treatment: 0 Allergies Active Allergies No Known Allergies Allergy Notes Electronic Signature(s) Signed: 01/17/2023 4:53:16 PM By: [name] Entered By: [name] on 01/17/2023 12:38:02 -------------------------------------------------------------------------------- Arrival Information Details Patient Name: Date of Service: Daniel Picking, DO NA LD 01/17/2023 12:30 PM Medical Record Number: [id number] Patient Account Number: [id number] Date of Birth/Sex: Treating RN: 1961/08/18 (62 y.o. [name] Primary Care Betha Walker: [name] Other Clinician: Referring [name]: Treating [name]/Extender: [name] in Treatment: 0 Visit Information Patient Arrived: Ambulatory Arrival Time: 12:35 Accompanied By: self Transfer Assistance: None Patient Identification Verified: Yes Secondary Verification Process Completed: Yes Electronic Signature(s) Signed: 01/17/2023 4:53:16 PM By: [name] By: [name] on 01/17/2023 12:35:53 -------------------------------------------------------------------------------- Clinic Level of Care Assessment Details Patient Name: Date of Service: [name], DO NA LD 01/17/2023 12:30 PM Medical Record Number: [id number] Patient Account Number: [id number] Date of Birth/Sex: Treating RN: Oct 06, 1961 (63 y.o. [name] Primary Care [name]: [name] Other Clinician: Referring  [name]: Treating [name]/Extender: [name] Walker in Treatment: 0 Clinic Level of Care Assessment Items TOOL 1 Quantity Score X- 1 0 Use when EandM and Procedure is performed on INITIAL visit ASSESSMENTS - Nursing Assessment / Reassessment X- 1 20 General Physical Exam (combine w/ comprehensive assessment (listed just below) when performed on new pt. evals) X- 1 25 Comprehensive Assessment (HX, ROS, Risk Assessments, Wounds Hx, etc.) ASSESSMENTS - Wound and Skin Assessment / Reassessment '[]'$  - 0 Dermatologic / Skin Assessment (not related to wound area) [name], [name] ([id number]) [id number] Page 2 of 8 ASSESSMENTS - Ostomy and/or Continence Assessment and Care '[]'$  - 0 Incontinence Assessment and Management '[]'$  - 0 Ostomy Care Assessment and Management (repouching, etc.) PROCESS - Coordination of Care X - Simple Patient / Family Education for ongoing care 1 15 '[]'$  - 0 Complex (extensive) Patient / Family Education for ongoing care X- 1 10 Staff obtains Programmer, systems, Records, T Results / Process Orders est '[]'$  - 0 Staff telephones HHA, Nursing Homes / Clarify orders / etc '[]'$  - 0 Routine Transfer to another Facility (non-emergent condition) '[]'$  - 0 Routine Hospital Admission (non-emergent condition) X- 1 15 New Admissions / Biomedical engineer / Ordering NPWT Apligraf, etc. , '[]'$  - 0 Emergency Hospital Admission (emergent condition) PROCESS - Special Needs '[]'$  - 0 Pediatric / Minor Patient Management '[]'$  - 0 Isolation Patient Management '[]'$  - 0 Hearing / Language / Visual special needs '[]'$  - 0 Assessment of Community assistance (transportation, D/C planning, etc.) '[]'$  - 0 Additional assistance / Altered mentation '[]'$  - 0 Support Surface(s) Assessment (bed, cushion, seat, etc.) INTERVENTIONS - Miscellaneous '[]'$  - 0 External ear exam '[]'$  - 0 Patient Transfer (multiple staff / Civil Service Walker streamer / Similar devices) '[]'$  - 0 Simple  Staple / Suture removal (25 or less) '[]'$  - 0 Complex Staple / Suture removal (26 or more) '[]'$  - 0 Hypo/Hyperglycemic Management (do not check if billed separately) X- 1 15 Ankle / Brachial Index (ABI) - do not check if billed separately Has the patient been seen at the hospital within the last three years: Yes Total Score: 100 Level Of Care: New/Established - Level 3  Electronic Signature(s) Unsigned Entered By: [name] on 01/19/2023 07:35:45 -------------------------------------------------------------------------------- Encounter Discharge Information Details Patient Name: Date of Service: [name], DO NA LD 01/17/2023 12:30 PM Medical Record Number: [id number] Patient Account Number: [id number] Date of Birth/Sex: Treating RN: 09-Apr-1961 (62 y.o. [name] Primary Care [name]: [name] Other Clinician: Referring [name]: Treating [name]/Extender: [name] Walker in Treatment: 0 Encounter Discharge Information Items Post Procedure Vitals Discharge Condition: Stable Temperature (F): 98.1 Ambulatory Status: Ambulatory Pulse (bpm): 107 Discharge Destination: Home Respiratory Rate (breaths/min): 18 Transportation: Private Auto Blood Pressure (mmHg): 157/73 Accompanied By: self Schedule Follow-up Appointment: Yes Clinical Summary of Care: Patient [name], [name] ([id number]) [id number] Page 3 of 8 Electronic Signature(s) Unsigned Entered By: [name] on 01/19/2023 07:36:28 -------------------------------------------------------------------------------- Lower Extremity Assessment Details Patient Name: Date of Service: [name], DO NA LD 01/17/2023 12:30 PM Medical Record Number: [id number] Patient Account Number: [id number] Date of Birth/Sex: Treating RN: September 30, 1961 (62 y.o. [name] Primary Care [name]: [name] Other Clinician: Referring  [name]: Treating [name]/Extender: [name] Walker in Treatment: 0 Edema Assessment Assessed: [Left: No] [Right: No] [Left: Edema] [Right: :] Calf Left: Right: Point of Measurement: From Medial Instep 39 cm 37 cm Ankle Left: Right: Point of Measurement: From Medial Instep 22.8 cm 20.8 cm Vascular Assessment Pulses: Dorsalis Pedis Palpable: [Left:No] [Right:No] Blood Pressure: Brachial: [Left:157] [Right:157] Ankle: [Left:Dorsalis Pedis: 142 0.90] [Right:Dorsalis Pedis: 162 1.03] Electronic Signature(s) Signed: 01/17/2023 4:53:16 PM By: [name] Entered By: [name] on 01/17/2023 13:13:00 -------------------------------------------------------------------------------- Multi Wound Chart Details Patient Name: Date of Service: Daniel Picking, DO NA LD 01/17/2023 12:30 PM Medical Record Number: [id number] Patient Account Number: [id number] Date of Birth/Sex: Treating RN: 1961-03-26 (62 y.o. M) Primary Care Seaton Hofmann: [name] Other Clinician: Referring Helaina Stefano: Treating Gahel Safley/Extender: [name], [name] in Treatment: 0 Vital Signs Height(in): 72 Capillary Blood Glucose(mg/dl): 200 Weight(lbs): 220 Pulse(bpm): 107 Body Mass Index(BMI): 29.8 Blood Pressure(mmHg): 157/73 Temperature(F): 98.1 Respiratory Rate(breaths/min): 18 [1:Photos:] [N/A:N/A] Right Metatarsal head fourth N/A N/A Wound Location: Trauma N/A N/A Wounding Event: Diabetic Wound/Ulcer of the Lower N/A N/A Primary Etiology: Extremity Hypertension, Type II Diabetes, N/A N/A Comorbid History: Osteoarthritis, Neuropathy 11/13/2022 N/A N/A Date Acquired: 0 N/A N/A Walker of Treatment: Open N/A N/A Wound Status: No N/A N/A Wound Recurrence: 1.7x0.7x1 N/A N/A Measurements L x W x D (cm) 0.935 N/A N/A A (cm) : rea 0.935 N/A N/A Volume (cm) : Grade 2 N/A N/A Classification: Medium N/A N/A Exudate A  mount: Serosanguineous N/A N/A Exudate Type: red, brown N/A N/A Exudate Color: Distinct, outline attached N/A N/A Wound Margin: Large (67-100%) N/A N/A Granulation A mount: Red, Pink N/A N/A Granulation Quality: Small (1-33%) N/A N/A Necrotic A mount: Fat Layer (Subcutaneous Tissue): Yes N/A N/A Exposed Structures: Fascia: No Tendon: No Muscle: No Joint: No Bone: No None N/A N/A Epithelialization: Debridement - Selective/Open Wound N/A N/A Debridement: Pre-procedure Verification/Time Out 13:23 N/A N/A Taken: Lidocaine 4% T opical Solution N/A N/A Pain Control: Callus, Slough N/A N/A Tissue Debrided: Non-Viable Tissue N/A N/A Level: 1.19 N/A N/A Debridement A (sq cm): rea Curette N/A N/A Instrument: Minimum N/A N/A Bleeding: Pressure N/A N/A Hemostasis A chieved: Procedure was tolerated well N/A N/A Debridement Treatment Response: 1.7x0.7x1 N/A N/A Post Debridement Measurements L x W x D (cm) 0.935 N/A N/A Post Debridement Volume: (cm) Callus: Yes N/A N/A Periwound Skin Texture: No Abnormalities Noted N/A N/A Periwound Skin Moisture: No Abnormalities Noted N/A N/A Periwound Skin Color: No Abnormality N/A N/A  Temperature: Debridement N/A N/A Procedures Performed: Treatment Notes Electronic Signature(s) Signed: 01/17/2023 3:51:46 PM By: Daniel Maudlin MD FACS Previous Signature: 01/17/2023 3:16:54 PM Version By: Daniel Maudlin MD FACS Entered By: [name] on 01/17/2023 15:51:46 -------------------------------------------------------------------------------- Multi-Disciplinary Care Plan Details Patient Name: Date of Service: Daniel Picking, DO NA LD 01/17/2023 12:30 PM Medical Record Number: [id number] Patient Account Number: [id number] Date of Birth/Sex: Treating RN: 21-Dec-1960 (62 y.o. [name] Primary Care Silena Wyss: [name] Other Clinician: Referring Paulina Muchmore: Treating Jessen Siegman/Extender: [name] Walker in Treatment: 0 Active Inactive Peripheral Neuropathy Nursing Diagnoses: Knowledge deficit related to disease process and management of peripheral neurovascular dysfunction Potential alteration in peripheral tissue perfusion (select prior to confirmation of diagnosis) Wray, [name] ([id number]) [id number] Page 5 of 8 Goals: Patient/caregiver will verbalize understanding of disease process and disease management Date Initiated: 01/19/2023 Target Resolution Date: 03/23/2023 Goal Status: Active Interventions: Assess signs and symptoms of neuropathy upon admission and as needed Provide education on Management of Neuropathy and Related Ulcers Notes: Wound/Skin Impairment Nursing Diagnoses: Impaired tissue integrity Knowledge deficit related to ulceration/compromised skin integrity Goals: Patient will have a decrease in wound volume by X% from date: (specify in notes) Date Initiated: 01/19/2023 Target Resolution Date: 03/23/2023 Goal Status: Active Interventions: Assess patient/caregiver ability to obtain necessary supplies Assess patient/caregiver ability to perform ulcer/skin care regimen upon admission and as needed Assess ulceration(s) every visit Treatment Activities: Skin care regimen initiated : 01/17/2023 Topical wound management initiated : 01/17/2023 Notes: Electronic Signature(s) Unsigned Entered By: [name] on 01/19/2023 07:34:53 -------------------------------------------------------------------------------- Pain Assessment Details Patient Name: Date of Service: Zielke, DO NA LD 01/17/2023 12:30 PM Medical Record Number: [id number] Patient Account Number: [id number] Date of Birth/Sex: Treating RN: [birth year] (61 y.o. [name] Primary Care Audryana Hockenberry: [name] Other Clinician: Referring Tracina Beaumont: Treating Derril Franek/Extender: [name] Walker in Treatment: 0 Active Problems Location  of Pain Severity and Description of Pain Patient Has Paino No Site Locations Rate the pain. Current Pain Level: 0 Pain Management and Medication Bohall, [name] ([id number]) [id number] Page 6 of 8 Current Pain Management: Electronic Signature(s) Signed: 01/17/2023 4:53:16 PM By: [name] By: [name] on 01/17/2023 13:14:17 -------------------------------------------------------------------------------- Patient/Caregiver Education Details Patient Name: Date of Service: Jolyne Loa NA LD 3/6/2024andnbsp12:30 PM Medical Record Number: [id number] Patient Account Number: [id number] Date of Birth/Gender: Treating RN: 01/04/1961 (62 y.o. [name] Primary Care Physician: [name] Other Clinician: Referring Physician: Treating Physician/Extender: [name] in Treatment: 0 Education Assessment Education Provided To: Patient Education Topics Provided Wound/Skin Impairment: Methods: Explain/Verbal Responses: Reinforcements needed, State content correctly Electronic Signature(s) Unsigned Entered By: [name] on 01/19/2023 07:35:02 -------------------------------------------------------------------------------- Wound Assessment Details Patient Name: Date of Service: Tieszen, DO NA LD 01/17/2023 12:30 PM Medical Record Number: [id number] Patient Account Number: [id number] Date of Birth/Sex: Treating RN: [birth year] (62 y.o. [name] Primary Care Kenyada Hy: [name] Other Clinician: Referring Thora Scherman: Treating Makeya Hilgert/Extender: [name] Walker in Treatment: 0 Wound Status Wound Number: 1 Primary Etiology: Diabetic Wound/Ulcer of the Lower Extremity Wound Location: Right Metatarsal head fourth Wound Status: Open Wounding Event: Trauma Comorbid History: Hypertension, Type II Diabetes, Osteoarthritis, Neuropathy Date Acquired:  11/13/2022 Walker Of Treatment: 0 Clustered Wound: No Photos Wound Measurements Laning, [name] ([id number]) Length: (cm) 1.7 Width: (cm) 0.7 Depth: (cm) 1 Area: (cm) 0.935 Volume: (cm) 0.935 [id number] Page 7 of 8 % Reduction in Area: % Reduction in Volume: Epithelialization: None Tunneling: No Undermining: No Wound Description Classification: Grade 2 Wound Margin:  Distinct, outline attached Exudate Amount: Medium Exudate Type: Serosanguineous Exudate Color: red, brown Foul Odor After Cleansing: No Slough/Fibrino Yes Wound Bed Granulation Amount: Large (67-100%) Exposed Structure Granulation Quality: Red, Pink Fascia Exposed: No Necrotic Amount: Small (1-33%) Fat Layer (Subcutaneous Tissue) Exposed: Yes Necrotic Quality: Adherent Slough Tendon Exposed: No Muscle Exposed: No Joint Exposed: No Bone Exposed: No Periwound Skin Texture Texture Color No Abnormalities Noted: No No Abnormalities Noted: Yes Callus: Yes Temperature / Pain Temperature: No Abnormality Moisture No Abnormalities Noted: Yes Treatment Notes Wound #1 (Metatarsal head fourth) Wound Laterality: Right Cleanser Soap and Water Discharge Instruction: May shower and wash wound with dial antibacterial soap and water prior to dressing change. Wound Cleanser Discharge Instruction: Cleanse the wound with wound cleanser prior to applying a clean dressing using gauze sponges, not tissue or cotton balls. Peri-Wound Care Topical Primary Dressing Maxorb Extra Ag+ Alginate Dressing, 2x2 (in/in) Discharge Instruction: Apply to wound bed as instructed Secondary Dressing Optifoam Non-Adhesive Dressing, 4x4 in Discharge Instruction: Apply over primary dressing as directed. Woven Gauze Sponge, Non-Sterile 4x4 in Discharge Instruction: Apply over primary dressing as directed. Secured With 60M Medipore H Soft Cloth Surgical T ape, 4 x 10 (in/yd) Discharge Instruction: Secure with tape as  directed. Compression Wrap Compression Stockings Add-Ons Electronic Signature(s) Signed: 01/17/2023 4:53:16 PM By: [name] Entered By: [name] on 01/17/2023 13:01:36 -------------------------------------------------------------------------------- Vitals Details Patient Name: Date of Service: Daniel Picking, DO NA LD 01/17/2023 12:30 PM Medical Record Number: [id number] Patient Account Number: [id number] [name] ([id number]) [id number] Page 8 of 8 Date of Birth/Sex: Treating RN: 1961-05-15 (62 y.o. [name] Primary Care Loryn Haacke: Other Clinician: Hiram Walker Referring Shaden Higley: Treating Ceriah Kohler/Extender: [name] Walker in Treatment: 0 Vital Signs Time Taken: 12:35 Temperature (F): 98.1 Height (in): 72 Pulse (bpm): 107 Source: Stated Respiratory Rate (breaths/min): 18 Weight (lbs): 220 Blood Pressure (mmHg): 157/73 Source: Stated Capillary Blood Glucose (mg/dl): 200 Body Mass Index (BMI): 29.8 Reference Range: 80 - 120 mg / dl Electronic Signature(s) Signed: 01/17/2023 4:53:16 PM By: [name] Entered By: [name] on 01/17/2023 12:36:39

## 2023-01-24 ENCOUNTER — Encounter (HOSPITAL_BASED_OUTPATIENT_CLINIC_OR_DEPARTMENT_OTHER): Payer: 59 | Admitting: Internal Medicine

## 2023-01-25 ENCOUNTER — Ambulatory Visit (HOSPITAL_COMMUNITY)
Admission: RE | Admit: 2023-01-25 | Discharge: 2023-01-25 | Disposition: A | Discharge status: Home / Self Care | Payer: 59 | Source: Ambulatory Visit | Attending: Podiatry | Admitting: Podiatry

## 2023-01-25 DIAGNOSIS — L97522 Non-pressure chronic ulcer of other part of left foot with fat layer exposed: Secondary | ICD-10-CM

## 2023-01-25 DIAGNOSIS — L97513 Non-pressure chronic ulcer of other part of right foot with necrosis of muscle: Secondary | ICD-10-CM | POA: Diagnosis not present

## 2023-01-26 LAB — VAS US ABI WITH/WO TBI
Left ABI: 1.21
Right ABI: 1.23

## 2023-01-30 ENCOUNTER — Other Ambulatory Visit: Payer: Self-pay | Admitting: Podiatry

## 2023-01-31 ENCOUNTER — Other Ambulatory Visit: Payer: Self-pay | Admitting: Podiatry

## 2023-01-31 ENCOUNTER — Telehealth: Payer: Self-pay | Admitting: *Deleted

## 2023-01-31 MED ORDER — DOXYCYCLINE HYCLATE 100 MG PO TABS: 100.0000 mg | ORAL_TABLET | Freq: Two times a day (BID) | ORAL | 1 refills | Status: DC

## 2023-01-31 NOTE — Telephone Encounter (Signed)
Patient is calling because his left foot has developed a blister , first noticed 1 day ago,, red , white pus,terrible pain with touch, has an upcoming appointment w/ wound center on tomorrow but unable to come in , has no transportation for today,would like a refill of the doxycycline , last one taken 1 day ago,Please advise.

## 2023-01-31 NOTE — Telephone Encounter (Signed)
I will send in refill. Thank you

## 2023-02-01 ENCOUNTER — Encounter (HOSPITAL_BASED_OUTPATIENT_CLINIC_OR_DEPARTMENT_OTHER): Payer: 59 | Admitting: General Surgery

## 2023-02-01 DIAGNOSIS — E11621 Type 2 diabetes mellitus with foot ulcer: Secondary | ICD-10-CM | POA: Diagnosis not present

## 2023-02-02 ENCOUNTER — Inpatient Hospital Stay (HOSPITAL_COMMUNITY)
Admission: EM | Admit: 2023-02-02 | Discharge: 2023-02-05 | DRG: 623 | Disposition: A | Discharge status: Home Health | Payer: 59 | Attending: Internal Medicine | Admitting: Internal Medicine

## 2023-02-02 ENCOUNTER — Emergency Department (HOSPITAL_COMMUNITY): Payer: 59

## 2023-02-02 ENCOUNTER — Other Ambulatory Visit: Payer: Self-pay

## 2023-02-02 DIAGNOSIS — M869 Osteomyelitis, unspecified: Secondary | ICD-10-CM | POA: Diagnosis present

## 2023-02-02 DIAGNOSIS — L97519 Non-pressure chronic ulcer of other part of right foot with unspecified severity: Secondary | ICD-10-CM | POA: Diagnosis present

## 2023-02-02 DIAGNOSIS — E11621 Type 2 diabetes mellitus with foot ulcer: Secondary | ICD-10-CM | POA: Diagnosis present

## 2023-02-02 DIAGNOSIS — I10 Essential (primary) hypertension: Secondary | ICD-10-CM | POA: Diagnosis present

## 2023-02-02 DIAGNOSIS — M86172 Other acute osteomyelitis, left ankle and foot: Secondary | ICD-10-CM | POA: Diagnosis present

## 2023-02-02 DIAGNOSIS — E118 Type 2 diabetes mellitus with unspecified complications: Secondary | ICD-10-CM

## 2023-02-02 DIAGNOSIS — N179 Acute kidney failure, unspecified: Secondary | ICD-10-CM | POA: Diagnosis not present

## 2023-02-02 DIAGNOSIS — L02612 Cutaneous abscess of left foot: Secondary | ICD-10-CM | POA: Diagnosis present

## 2023-02-02 DIAGNOSIS — E1165 Type 2 diabetes mellitus with hyperglycemia: Secondary | ICD-10-CM | POA: Diagnosis present

## 2023-02-02 DIAGNOSIS — Z8249 Family history of ischemic heart disease and other diseases of the circulatory system: Secondary | ICD-10-CM

## 2023-02-02 DIAGNOSIS — Z794 Long term (current) use of insulin: Secondary | ICD-10-CM

## 2023-02-02 DIAGNOSIS — F411 Generalized anxiety disorder: Secondary | ICD-10-CM | POA: Diagnosis present

## 2023-02-02 DIAGNOSIS — R262 Difficulty in walking, not elsewhere classified: Secondary | ICD-10-CM | POA: Diagnosis present

## 2023-02-02 DIAGNOSIS — E119 Type 2 diabetes mellitus without complications: Secondary | ICD-10-CM | POA: Diagnosis not present

## 2023-02-02 DIAGNOSIS — E7849 Other hyperlipidemia: Secondary | ICD-10-CM | POA: Diagnosis not present

## 2023-02-02 DIAGNOSIS — E785 Hyperlipidemia, unspecified: Secondary | ICD-10-CM | POA: Diagnosis present

## 2023-02-02 DIAGNOSIS — I739 Peripheral vascular disease, unspecified: Secondary | ICD-10-CM | POA: Diagnosis present

## 2023-02-02 DIAGNOSIS — M009 Pyogenic arthritis, unspecified: Secondary | ICD-10-CM | POA: Diagnosis present

## 2023-02-02 DIAGNOSIS — Z7982 Long term (current) use of aspirin: Secondary | ICD-10-CM

## 2023-02-02 DIAGNOSIS — L97529 Non-pressure chronic ulcer of other part of left foot with unspecified severity: Secondary | ICD-10-CM | POA: Diagnosis present

## 2023-02-02 DIAGNOSIS — Z79899 Other long term (current) drug therapy: Secondary | ICD-10-CM

## 2023-02-02 DIAGNOSIS — B951 Streptococcus, group B, as the cause of diseases classified elsewhere: Secondary | ICD-10-CM | POA: Diagnosis present

## 2023-02-02 DIAGNOSIS — M86072 Acute hematogenous osteomyelitis, left ankle and foot: Secondary | ICD-10-CM | POA: Diagnosis not present

## 2023-02-02 DIAGNOSIS — E1169 Type 2 diabetes mellitus with other specified complication: Secondary | ICD-10-CM | POA: Diagnosis present

## 2023-02-02 DIAGNOSIS — K219 Gastro-esophageal reflux disease without esophagitis: Secondary | ICD-10-CM | POA: Diagnosis present

## 2023-02-02 DIAGNOSIS — Z87891 Personal history of nicotine dependence: Secondary | ICD-10-CM

## 2023-02-02 DIAGNOSIS — Z7985 Long-term (current) use of injectable non-insulin antidiabetic drugs: Secondary | ICD-10-CM

## 2023-02-02 LAB — CBC WITH DIFFERENTIAL/PLATELET
Abs Immature Granulocytes: 0.1 10*3/uL — ABNORMAL HIGH (ref 0.00–0.07)
Basophils Absolute: 0.1 10*3/uL (ref 0.0–0.1)
Basophils Relative: 1 %
Eosinophils Absolute: 0.2 10*3/uL (ref 0.0–0.5)
Eosinophils Relative: 1 %
HCT: 40.5 % (ref 39.0–52.0)
Hemoglobin: 13.1 g/dL (ref 13.0–17.0)
Immature Granulocytes: 1 %
Lymphocytes Relative: 12 %
Lymphs Abs: 2.1 10*3/uL (ref 0.7–4.0)
MCH: 27.5 pg (ref 26.0–34.0)
MCHC: 32.3 g/dL (ref 30.0–36.0)
MCV: 85.1 fL (ref 80.0–100.0)
Monocytes Absolute: 1 10*3/uL (ref 0.1–1.0)
Monocytes Relative: 6 %
Neutro Abs: 13.6 10*3/uL — ABNORMAL HIGH (ref 1.7–7.7)
Neutrophils Relative %: 79 %
Platelets: 377 10*3/uL (ref 150–400)
RBC: 4.76 MIL/uL (ref 4.22–5.81)
RDW: 13.4 % (ref 11.5–15.5)
WBC: 17.1 10*3/uL — ABNORMAL HIGH (ref 4.0–10.5)
nRBC: 0 % (ref 0.0–0.2)

## 2023-02-02 LAB — COMPREHENSIVE METABOLIC PANEL
ALT: 18 U/L (ref 0–44)
AST: 18 U/L (ref 15–41)
Albumin: 3.6 g/dL (ref 3.5–5.0)
Alkaline Phosphatase: 91 U/L (ref 38–126)
Anion gap: 14 (ref 5–15)
BUN: 19 mg/dL (ref 8–23)
CO2: 21 mmol/L — ABNORMAL LOW (ref 22–32)
Calcium: 9.2 mg/dL (ref 8.9–10.3)
Chloride: 96 mmol/L — ABNORMAL LOW (ref 98–111)
Creatinine, Ser: 1.26 mg/dL — ABNORMAL HIGH (ref 0.61–1.24)
GFR, Estimated: >60 mL/min (ref >60)
Glucose, Bld: 201 mg/dL — ABNORMAL HIGH (ref 70–99)
Potassium: 3.9 mmol/L (ref 3.5–5.1)
Sodium: 131 mmol/L — ABNORMAL LOW (ref 135–145)
Total Bilirubin: 0.7 mg/dL (ref 0.3–1.2)
Total Protein: 8.6 g/dL — ABNORMAL HIGH (ref 6.5–8.1)

## 2023-02-02 LAB — LACTIC ACID, PLASMA: Lactic Acid, Venous: 1.7 mmol/L (ref 0.5–1.9)

## 2023-02-02 MED ORDER — COLCHICINE 0.6 MG PO TABS
0.6000 mg | ORAL_TABLET | Freq: Every day | ORAL | Status: DC
  Administered 2023-02-03: 0.6 mg via ORAL
  Filled 2023-02-02 (×3): qty 1

## 2023-02-02 MED ORDER — ENOXAPARIN SODIUM 40 MG/0.4ML IJ SOSY
40.0000 mg | PREFILLED_SYRINGE | INTRAMUSCULAR | Status: DC
  Administered 2023-02-03 – 2023-02-04 (×2): 40 mg via SUBCUTANEOUS
  Filled 2023-02-02 (×2): qty 0.4

## 2023-02-02 MED ORDER — ACETAMINOPHEN 650 MG RE SUPP: 650.0000 mg | Freq: Four times a day (QID) | RECTAL | Status: DC | PRN

## 2023-02-02 MED ORDER — PANTOPRAZOLE SODIUM 40 MG PO TBEC
40.0000 mg | DELAYED_RELEASE_TABLET | Freq: Every day | ORAL | Status: DC
  Administered 2023-02-04 – 2023-02-05 (×2): 40 mg via ORAL
  Filled 2023-02-02 (×2): qty 1

## 2023-02-02 MED ORDER — SODIUM CHLORIDE 0.9 % IV SOLN: INTRAVENOUS | Status: DC

## 2023-02-02 MED ORDER — GADOBUTROL 1 MMOL/ML IV SOLN
10.0000 mL | Freq: Once | INTRAVENOUS | Status: AC | PRN
  Administered 2023-02-02: 10 mL via INTRAVENOUS

## 2023-02-02 MED ORDER — SENNOSIDES-DOCUSATE SODIUM 8.6-50 MG PO TABS: 1.0000 | ORAL_TABLET | Freq: Every evening | ORAL | Status: DC | PRN

## 2023-02-02 MED ORDER — PIPERACILLIN-TAZOBACTAM 3.375 G IVPB
3.3750 g | Freq: Three times a day (TID) | INTRAVENOUS | Status: DC
  Administered 2023-02-03 – 2023-02-04 (×3): 3.375 g via INTRAVENOUS
  Filled 2023-02-02 (×3): qty 50

## 2023-02-02 MED ORDER — CLONAZEPAM 0.5 MG PO TABS: 1.0000 mg | ORAL_TABLET | Freq: Once | ORAL | Status: DC

## 2023-02-02 MED ORDER — VANCOMYCIN HCL 2000 MG/400ML IV SOLN
2000.0000 mg | INTRAVENOUS | Status: DC
  Filled 2023-02-02: qty 400

## 2023-02-02 MED ORDER — ACETAMINOPHEN 325 MG PO TABS
650.0000 mg | ORAL_TABLET | Freq: Four times a day (QID) | ORAL | Status: DC | PRN
  Administered 2023-02-03: 650 mg via ORAL
  Filled 2023-02-02: qty 2

## 2023-02-02 MED ORDER — MORPHINE SULFATE (PF) 2 MG/ML IV SOLN
2.0000 mg | INTRAVENOUS | Status: DC | PRN
  Administered 2023-02-03: 2 mg via INTRAVENOUS
  Filled 2023-02-02: qty 1

## 2023-02-02 MED ORDER — INSULIN ASPART 100 UNIT/ML IJ SOLN
0.0000 [IU] | Freq: Every day | INTRAMUSCULAR | Status: DC
  Filled 2023-02-02: qty 0.05

## 2023-02-02 MED ORDER — SODIUM CHLORIDE 0.9 % IV BOLUS
1000.0000 mL | Freq: Once | INTRAVENOUS | Status: AC
  Administered 2023-02-02: 1000 mL via INTRAVENOUS

## 2023-02-02 MED ORDER — LISINOPRIL 5 MG PO TABS
2.5000 mg | ORAL_TABLET | Freq: Every day | ORAL | Status: DC
  Administered 2023-02-03 – 2023-02-05 (×3): 2.5 mg via ORAL
  Filled 2023-02-02 (×4): qty 1

## 2023-02-02 MED ORDER — VANCOMYCIN HCL IN DEXTROSE 1-5 GM/200ML-% IV SOLN
1000.0000 mg | Freq: Once | INTRAVENOUS | Status: AC
  Administered 2023-02-02: 1000 mg via INTRAVENOUS
  Filled 2023-02-02: qty 200

## 2023-02-02 MED ORDER — INSULIN GLARGINE-YFGN 100 UNIT/ML ~~LOC~~ SOLN
15.0000 [IU] | Freq: Every day | SUBCUTANEOUS | Status: DC
  Administered 2023-02-03 – 2023-02-04 (×3): 15 [IU] via SUBCUTANEOUS
  Filled 2023-02-02 (×4): qty 0.15

## 2023-02-02 MED ORDER — ONDANSETRON HCL 4 MG/2ML IJ SOLN: 4.0000 mg | Freq: Four times a day (QID) | INTRAMUSCULAR | Status: DC | PRN

## 2023-02-02 MED ORDER — LORAZEPAM 2 MG/ML IJ SOLN
1.0000 mg | Freq: Once | INTRAMUSCULAR | Status: AC
  Administered 2023-02-02: 1 mg via INTRAVENOUS
  Filled 2023-02-02: qty 1

## 2023-02-02 MED ORDER — LOSARTAN POTASSIUM 25 MG PO TABS: 25.0000 mg | ORAL_TABLET | Freq: Every day | ORAL | Status: DC

## 2023-02-02 MED ORDER — PIPERACILLIN-TAZOBACTAM 3.375 G IVPB 30 MIN
3.3750 g | Freq: Once | INTRAVENOUS | Status: AC
  Administered 2023-02-02: 3.375 g via INTRAVENOUS
  Filled 2023-02-02: qty 50

## 2023-02-02 MED ORDER — INSULIN ASPART 100 UNIT/ML IJ SOLN
0.0000 [IU] | Freq: Three times a day (TID) | INTRAMUSCULAR | Status: DC
  Filled 2023-02-02: qty 0.15

## 2023-02-02 MED ORDER — ATORVASTATIN CALCIUM 10 MG PO TABS
20.0000 mg | ORAL_TABLET | Freq: Every day | ORAL | Status: DC
  Administered 2023-02-03 – 2023-02-05 (×3): 20 mg via ORAL
  Filled 2023-02-02 (×3): qty 2

## 2023-02-02 MED ORDER — ALPRAZOLAM 0.5 MG PO TABS
0.5000 mg | ORAL_TABLET | Freq: Every evening | ORAL | Status: DC | PRN
  Administered 2023-02-03 – 2023-02-04 (×3): 0.5 mg via ORAL
  Filled 2023-02-02 (×3): qty 1

## 2023-02-02 MED ORDER — ONDANSETRON HCL 4 MG PO TABS: 4.0000 mg | ORAL_TABLET | Freq: Four times a day (QID) | ORAL | Status: DC | PRN

## 2023-02-02 NOTE — ED Provider Notes (Addendum)
[place] EMERGENCY DEPARTMENT AT [facility] Provider Note   CSN: [id number] Arrival date & time: 02/02/23  1909     History  Chief Complaint  Patient presents with   Wound Infection    L foot    [name] is a 62 y.o. male here with left foot infection.  Patient states that he has a history of gout and has been followed up with wound care as well as podiatry.  Patient has bilateral foot ulcers and has seen Dr. Blenda Mounts recently.  Patient is on chronic doxycycline.  Patient also follows with the wound clinic.  Patient states that about 3 days ago, he noticed an ulcer in the left foot and he went to the wound care center yesterday.  They debrided at bedside.  They also wrapped it and changed the dressing.  Patient states that he has been having more purulent drainage.  He was concerned that he may need an amputation so is very worried.  Denies any fevers or chills.  The history is provided by the patient.       Home Medications Prior to Admission medications   Medication Sig Start Date End Date Taking? Authorizing Provider  ALPRAZolam (XANAX) 0.5 MG tablet TAKE 1/2 TO 1 (ONE-HALF TO ONE) TABLET BY MOUTH AT BEDTIME AS NEEDED FOR ANXIETY OR SLEEP 11/30/20   Just, Laurita Quint, FNP  amphetamine-dextroamphetamine (ADDERALL) 15 MG tablet Take 0.5-1 tablets by mouth daily. 11/30/20   Just, Laurita Quint, FNP  aspirin 81 MG tablet Take 1 tablet (81 mg total) by mouth daily. 04/22/15   Jaynee Eagles, PA-C  atorvastatin (LIPITOR) 20 MG tablet Take 1 tablet (20 mg total) by mouth daily. 04/22/20   Daleen Squibb, MD  blood glucose meter kit and supplies KIT Per insurance preference. Check blood glucose once a day. Dx E11.9, Z79.4 01/22/20   Jacelyn Pi, Lilia Argue, MD  doxycycline (VIBRA-TABS) 100 MG tablet Take 1 tablet (100 mg total) by mouth 2 (two) times daily. 03/09/22   Wallene Huh, DPM  doxycycline (VIBRA-TABS) 100 MG tablet Take 1 tablet (100 mg total) by mouth 2 (two) times daily.  01/31/23   Lorenda Peck, DPM  gabapentin (NEURONTIN) 300 MG capsule Take 1 capsule (300 mg total) by mouth 3 (three) times daily. 01/03/23   Standiford, Nena Alexander, DPM  glucose blood test strip Test blood sugar daily. Dx code: 250.60. 08/09/16   [name] D, PA  insulin glargine (LANTUS SOLOSTAR) 100 UNIT/ML Solostar Pen Inject 16 Units into the skin at bedtime. 01/22/20   Jacelyn Pi, Lilia Argue, MD  Insulin Pen Needle (PEN NEEDLES) 32G X 6 MM MISC 1 each by Does not apply route at bedtime. 01/22/20   Daleen Squibb, MD  Lancets MISC Test blood sugar daily. Dx code 250.60 08/09/16   [name] D, PA  lisinopril (ZESTRIL) 5 MG tablet Take 0.5 tablets (2.5 mg total) by mouth daily. 04/22/20   Daleen Squibb, MD  omeprazole (PRILOSEC) 40 MG capsule Take 1 capsule (40 mg total) by mouth daily. 04/22/20   Daleen Squibb, MD  TRULICITY 1.5 0000000 SOPN ADMINISTER 1.5 MG UNDER THE SKIN 1 TIME A WEEK 01/28/21   Just, Laurita Quint, FNP  Omeprazole (PRILOSEC PO) Take 1 tablet by mouth every other day.  03/21/12  [provider]      Allergies    Patient has no known allergies.    Review of Systems   Review  of Systems  Musculoskeletal:        L foot pain   All other systems reviewed and are negative.   Physical Exam Updated Vital Signs BP (!) 167/88   Pulse (!) 122   Temp 98.4 F (36.9 C) (Oral)   Resp (!) 25   Ht 6' (1.829 m)   Wt 99.8 kg   SpO2 96%   BMI 29.84 kg/m  Physical Exam Vitals and nursing note reviewed.  Constitutional:      Comments: Anxious  HENT:     Head: Normocephalic.     Nose: Nose normal.     Mouth/Throat:     Mouth: Mucous membranes are moist.  Eyes:     Extraocular Movements: Extraocular movements intact.     Pupils: Pupils are equal, round, and reactive to light.  Cardiovascular:     Rate and Rhythm: Regular rhythm. Tachycardia present.     Pulses: Normal pulses.     Heart sounds: Normal heart sounds.  Pulmonary:      Effort: Pulmonary effort is normal.     Breath sounds: Normal breath sounds.  Abdominal:     General: Abdomen is flat.     Palpations: Abdomen is soft.  Musculoskeletal:     Cervical back: Normal range of motion and neck supple.     Comments: Patient has a right first toe ulcer with no obvious cellulitis.  Patient has a large left midfoot cellulitis with purulent drainage.  Also the left big toe is swollen and red as well.  Patient has diminished pulses in the left DP.  Neurological:     General: No focal deficit present.     Mental Status: He is oriented to person, place, and time.  Psychiatric:        Mood and Affect: Mood normal.        Behavior: Behavior normal.     ED Results / Procedures / Treatments   Labs (all labs ordered are listed, but only abnormal results are displayed) Labs Reviewed  COMPREHENSIVE METABOLIC PANEL - Abnormal; Notable for the following components:      Result Value   Sodium 131 (*)    Chloride 96 (*)    CO2 21 (*)    Glucose, Bld 201 (*)    Creatinine, Ser 1.26 (*)    Total Protein 8.6 (*)    All other components within normal limits  CBC WITH DIFFERENTIAL/PLATELET - Abnormal; Notable for the following components:   WBC 17.1 (*)    Neutro Abs 13.6 (*)    Abs Immature Granulocytes 0.10 (*)    All other components within normal limits  CULTURE, BLOOD (ROUTINE X 2)  CULTURE, BLOOD (ROUTINE X 2)  LACTIC ACID, PLASMA  LACTIC ACID, PLASMA    EKG EKG Interpretation  Date/Time:  Friday February 02 2023 20:02:05 EDT Ventricular Rate:  130 PR Interval:  141 QRS Duration: 87 QT Interval:  299 QTC Calculation: 440 R Axis:   -59 Text Interpretation: Sinus tachycardia Ventricular premature complex Probable left ventricular hypertrophy Inferior infarct, age indeterminate Anterior Q waves, possibly due to LVH Since last tracing rate faster Confirmed by Wandra Arthurs [phone]) on 02/02/2023 8:24:14 PM  Radiology DG Foot Complete Left  Result Date:  02/02/2023 CLINICAL DATA:  Left foot infection EXAM: LEFT FOOT - COMPLETE 3+ VIEW COMPARISON:  12/21/2022 FINDINGS: Progressive destruction of the proximal and distal phalanx of the left great toe centered at the interphalangeal joint. Prominent soft tissue swelling. No additional sites  of bony erosion. No acute fracture or dislocation. There is bandaging material overlying the forefoot. IMPRESSION: Progressive destruction of the proximal and distal phalanx of the left great toe centered at the interphalangeal joint, compatible with osteomyelitis and septic arthritis. Electronically Signed   By: Davina Poke D.O.   On: 02/02/2023 20:05    Procedures Procedures   CRITICAL CARE Performed by: Wandra Arthurs   Total critical care time: 38 minutes  Critical care time was exclusive of separately billable procedures and treating other patients.  Critical care was necessary to treat or prevent imminent or life-threatening deterioration.  Critical care was time spent personally by me on the following activities: development of treatment plan with patient and/or surrogate as well as nursing, discussions with consultants, evaluation of patient's response to treatment, examination of patient, obtaining history from patient or surrogate, ordering and performing treatments and interventions, ordering and review of laboratory studies, ordering and review of radiographic studies, pulse oximetry and re-evaluation of patient's condition.   Medications Ordered in ED Medications  vancomycin (VANCOCIN) IVPB 1000 mg/200 mL premix (1,000 mg Intravenous New Bag/Given 02/02/23 2040)  sodium chloride 0.9 % bolus 1,000 mL (1,000 mLs Intravenous New Bag/Given 02/02/23 2039)  LORazepam (ATIVAN) injection 1 mg (1 mg Intravenous Given 02/02/23 2042)  piperacillin-tazobactam (ZOSYN) IVPB 3.375 g (3.375 g Intravenous New Bag/Given 02/02/23 2040)    ED Course/ Medical Decision Making/ A&P                              Medical Decision Making Gust Salz is a 62 y.o. male here presenting with left foot pain and purulent drainage.  Patient is on chronic doxycycline.  Patient has been followed with wound care as well as podiatry.  I am concerned for osteomyelitis versus deep abscess versus worsening cellulitis.  Plan to do sepsis workup with CBC and CMP and lactate and cultures and left foot x-ray  8:44 PM Left foot x-rays show osteomyelitis.  Discussed case with the podiatrist on-call, Dr. Jacqualyn Posey.  He states that patient will need IV antibiotics and he recommend MRI of the left foot.  He will see the patient as a consult tomorrow to figure out surgical plan and recommend NPO after midnight.   9:18 PM WBC is 17.  Given IV antibiotics.  Hospitalist to admit for osteomyelitis of the left big toe.  MRI pending.   Problems Addressed: Toe osteomyelitis, left ([place]): acute illness or injury  Amount and/or Complexity of Data Reviewed Labs: ordered. Decision-making details documented in ED Course. Radiology: ordered and independent interpretation performed. Decision-making details documented in ED Course.  Risk Prescription drug management. Decision regarding hospitalization.    Final Clinical Impression(s) / ED Diagnoses Final diagnoses:  Toe osteomyelitis, left [facility])    Rx / DC Orders ED Discharge Orders     None         Drenda Freeze, MD 02/02/23 2119    Drenda Freeze, MD 02/02/23 2119

## 2023-02-02 NOTE — Progress Notes (Signed)
[name] ([id number]) [id number] Page 1 of 9 Visit Report for 02/01/2023 Arrival Information Details Patient Name: Date of Service: [name] [place] LD 02/01/2023 12:45 PM Medical Record Number: [id number] Patient Account Number: [id number] Date of Birth/Sex: Treating RN: [birth year] (62 y.o. M) Primary Care [name]: [name] Other Clinician: Referring [name]: Treating [name]/Extender: [name] Weeks in Walker: 2 Visit Information History Since Last Visit All ordered tests and consults were completed: No Patient Arrived: [name] Added or deleted any medications: No Arrival Time: 12:53 Any new allergies or adverse reactions: No Accompanied By: self Had a fall or experienced change in No Transfer Assistance: Manual activities of daily living that may affect Patient Identification Verified: Yes risk of falls: Secondary Verification Process Completed: Yes Signs or symptoms of abuse/neglect since last visito No Hospitalized since last visit: No Implantable device outside of the clinic excluding No cellular tissue based products placed in the center since last visit: Pain Present Now: No Electronic Signature(s) Signed: 02/01/2023 5:17:55 PM By: [name] Entered By: [name] on 02/01/2023 12:53:36 -------------------------------------------------------------------------------- Encounter Discharge Information Details Patient Name: Date of Service: [name] NA LD 02/01/2023 12:45 PM Medical Record Number: [id number] Patient Account Number: [id number] Date of Birth/Sex: Treating RN: [birth year] (62 y.o. [name] Primary Care [name]: [name] Other Clinician: Referring [name]: Treating [name]/Extender: [name] Weeks in Walker: 2 Encounter Discharge Information Items Post Procedure Vitals Discharge Condition: Stable Temperature (F): 98.4 Ambulatory  Status: Cane Pulse (bpm): 120 Discharge Destination: Home Respiratory Rate (breaths/min): 20 Transportation: Private Auto Blood Pressure (mmHg): 190/100 Accompanied By: self Schedule Follow-up Appointment: Yes Clinical Summary of Care: Patient Declined Electronic Signature(s) Signed: 02/01/2023 6:21:07 PM By: Daniel Catholic RN Entered By: [name] on 02/01/2023 18:17:42 -------------------------------------------------------------------------------- Lower Extremity Assessment Details Patient Name: Date of Service: [name], [name] NA LD 02/01/2023 12:45 PM Medical Record Number: [id number] Patient Account Number: [id number] Date of Birth/Sex: Treating RN: 1961/10/17 (62 y.o. [name] Primary Care [name]: [name] Other Clinician: Referring [name]: Treating [name]/Extender: [name] Weeks in Walker: 2 Edema Assessment Assessed: [name]: No] [Right: No] F[LeftLOIS, Walker [id number] [[id number] Page 2 of 9] [Left: Edema] [Right: :] Calf Left: Right: Point of Measurement: From Medial Instep 39 cm 37 cm Ankle Left: Right: Point of Measurement: From Medial Instep 22.8 cm 20.8 cm Vascular Assessment Pulses: Dorsalis Pedis Palpable: [Left:Yes] [Right:Yes] Electronic Signature(s) Signed: 02/01/2023 6:21:07 PM By: Daniel Catholic RN Entered By: [name] on 02/01/2023 13:08:05 -------------------------------------------------------------------------------- Multi Wound Chart Details Patient Name: Date of Service: [name], [name] NA LD 02/01/2023 12:45 PM Medical Record Number: [id number] Patient Account Number: [id number] Date of Birth/Sex: Treating RN: [birth year] (62 y.o. M) Primary Care [name]: [name] Other Clinician: Referring [name]: Treating [name]/Extender: [name], [name]: 2 Vital Signs Height(in): 72 Capillary Blood  Glucose(mg/dl): 200 Weight(lbs): 220 Pulse(bpm): 120 Body Mass Index(BMI): 29.8 Blood Pressure(mmHg): 190/100 Temperature(F): 98.4 Respiratory Rate(breaths/min): 20 [1:Photos:] Right Metatarsal head fourth Left T Second oe Left, Medial Foot Wound Location: Trauma Gradually Appeared Trauma Wounding Event: Diabetic Wound/Ulcer of the Lower Diabetic Wound/Ulcer of the Lower Abscess Primary Etiology: Extremity Extremity Hypertension, Type II Diabetes, Hypertension, Type II Diabetes, Hypertension, Type II Diabetes, Comorbid History: Osteoarthritis, Neuropathy Osteoarthritis, Neuropathy Osteoarthritis, Neuropathy 11/13/2022 01/22/2023 01/30/2023 Date Acquired: 2 0 0 Weeks of Walker: Open Open Open Wound Status: No No No Wound Recurrence: 1.4x0.9x0.7 1.7x1.3x0.2 2.7x2.5x4.2 Measurements L x W x D (cm) 0.99 1.736 5.301 A (cm) : rea 0.693  0.347 22.266 Volume (cm) : -5.90% N/A N/A % Reduction in A rea: 25.90% N/A N/A % Reduction in Volume: 12 Starting Position 1 (o'clock): 12 Ending Position 1 (o'clock): 0.9 Maximum Distance 1 (cm): Yes No No Undermining: Grade 2 Grade 1 Full Thickness Without Exposed Classification: Support Structures Medium Medium Medium Exudate Amount: Serosanguineous Serosanguineous Serosanguineous Exudate Type: red, brown red, brown red, brown Exudate Color: Distinct, outline attached N/A N/A Wound Margin: Hoefle, Elenore Rota ([id number] Page 3 of 9 Large (67-100%) Small (1-33%) Small (1-33%) Granulation Amount: Red, Pink Red, Pink Red Granulation Quality: Small (1-33%) Large (67-100%) Large (67-100%) Necrotic Amount: Adherent [place] Necrotic Tissue: Fat Layer (Subcutaneous Tissue): Yes Fat Layer (Subcutaneous Tissue): Yes Fat Layer (Subcutaneous Tissue): Yes Exposed Structures: Fascia: No Fascia: No Fascia: No Tendon: No Tendon: No Tendon: No Muscle:  No Muscle: No Muscle: No Joint: No Joint: No Joint: No Bone: No Bone: No Bone: No None N/A Small (1-33%) Epithelialization: Debridement - Selective/Open Wound Debridement - Selective/Open Wound N/A Debridement: Pre-procedure Verification/Time Out 13:29 13:29 N/A Taken: Lidocaine 4% Topical Solution Lidocaine 4% T opical Solution N/A Pain Control: Callus Callus, Slough N/A Tissue Debrided: Non-Viable Tissue Non-Viable Tissue N/A Level: 1.26 2.21 N/A Debridement A (sq cm): rea Curette Curette N/A Instrument: Minimum Minimum N/A Bleeding: Pressure Pressure N/A Hemostasis A chieved: 0 0 N/A Procedural Pain: 0 0 N/A Post Procedural Pain: Procedure was tolerated well Procedure was tolerated well N/A Debridement Walker Response: 1.4x0.9x0.7 1.7x1.3x0.2 N/A Post Debridement Measurements L x W x D (cm) 0.693 0.347 N/A Post Debridement Volume: (cm) Callus: Yes Callus: Yes No Abnormalities Noted Periwound Skin Texture: No Abnormalities Noted No Abnormalities Noted No Abnormalities Noted Periwound Skin Moisture: No Abnormalities Noted No Abnormalities Noted No Abnormalities Noted Periwound Skin Color: No Abnormality No Abnormality No Abnormality Temperature: Debridement Debridement Incision and Drainage Procedures Performed: Walker Notes Electronic Signature(s) Signed: 02/01/2023 2:58:59 PM By: Fredirick Maudlin MD FACS Entered By: Fredirick Maudlin on 02/01/2023 14:58:59 -------------------------------------------------------------------------------- Multi-Disciplinary Care Plan Details Patient Name: Date of Service: [name], [name] NA LD 02/01/2023 12:45 PM Medical Record Number: [id number] Patient Account Number: [id number] Date of Birth/Sex: Treating RN: 28-Mar-1961 (62 y.o. [name] Primary Care Tarena Gockley: [name] Other Clinician: Referring Maanasa Aderhold: Treating Harl Wiechmann/Extender: [name] Weeks in Walker:  2 Active Inactive Peripheral Neuropathy Nursing Diagnoses: Knowledge deficit related to disease process and management of peripheral neurovascular dysfunction Potential alteration in peripheral tissue perfusion (select prior to confirmation of diagnosis) Goals: Patient/caregiver will verbalize understanding of disease process and disease management Date Initiated: 01/19/2023 Target Resolution Date: 03/23/2023 Goal Status: Active Interventions: Assess signs and symptoms of neuropathy upon admission and as needed Provide education on Management of Neuropathy and Related Ulcers Notes: Wound/Skin Impairment Nursing Diagnoses: Impaired tissue integrity Knowledge deficit related to ulceration/compromised skin integrity Giraud, Elenore Rota ([id number] Page 4 of 9 Goals: Patient will have a decrease in wound volume by X% from date: (specify in notes) Date Initiated: 01/19/2023 Target Resolution Date: 03/23/2023 Goal Status: Active Interventions: Assess patient/caregiver ability to obtain necessary supplies Assess patient/caregiver ability to perform ulcer/skin care regimen upon admission and as needed Assess ulceration(s) every visit Walker Activities: Skin care regimen initiated : 01/17/2023 Topical wound management initiated : 01/17/2023 Notes: Electronic Signature(s) Signed: 02/01/2023 6:21:07 PM By: Daniel Catholic RN Entered By: [name] on 02/01/2023 18:06:57 -------------------------------------------------------------------------------- Pain Assessment Details Patient Name: Date of Service: [name], [name] NA LD 02/01/2023 12:45 PM Medical Record Number: [id number] Patient Account Number: [id number] Date  of Birth/Sex: Treating RN: [birth year] (62 y.o. M) Primary Care Tee Richeson: [name] Other Clinician: Referring Alexya Mcdaris: Treating Sherby Moncayo/Extender: [name] Weeks in Walker: 2 Active Problems Location of  Pain Severity and Description of Pain Patient Has Paino Yes Site Locations Rate the pain. Current Pain Level: Insensate Worst Pain Level: 10 Least Pain Level: 0 Tolerable Pain Level: 2 Pain Management and Medication Current Pain Management: Electronic Signature(s) Signed: 02/01/2023 5:17:55 PM By: [name] Entered By: [name] on 02/01/2023 12:54:43 -------------------------------------------------------------------------------- Patient/Caregiver Education Details Patient Name: Date of Service: Jolyne Loa NA LD 3/21/2024andnbsp12:45 PM Medical Record Number: [id number] Patient Account Number: [id number] Date of Birth/Gender: Treating RN: [birth year] (62 y.o. [name] Primary Care Physician: [name] Other Clinician: Referring Physician: Treating Physician/Extender: Tonna Boehringer in Walker: 2 [name] ([id number]) [id number] Page 5 of 9 Education Assessment Education Provided To: Patient Education Topics Provided Wound/Skin Impairment: Methods: Explain/Verbal Responses: Return demonstration correctly Electronic Signature(s) Signed: 02/01/2023 6:21:07 PM By: Daniel Catholic RN Entered By: [name] on 02/01/2023 18:07:10 -------------------------------------------------------------------------------- Wound Assessment Details Patient Name: Date of Service: Siess, [name] NA LD 02/01/2023 12:45 PM Medical Record Number: [id number] Patient Account Number: [id number] Date of Birth/Sex: Treating RN: [birth year] (62 y.o. M) Primary Care Cortasia Screws: [name] Other Clinician: Referring Shadie Sweatman: Treating Vance Hochmuth/Extender: [name], [name]: 2 Wound Status Wound Number: 1 Primary Etiology: Diabetic Wound/Ulcer of the Lower Extremity Wound Location: Right Metatarsal head fourth Wound Status: Open Wounding Event: Trauma Comorbid History:  Hypertension, Type II Diabetes, Osteoarthritis, Neuropathy Date Acquired: 11/13/2022 Weeks Of Walker: 2 Clustered Wound: No Photos Wound Measurements Length: (cm) 1.4 Width: (cm) 0.9 Depth: (cm) 0.7 Area: (cm) 0.99 Volume: (cm) 0.693 % Reduction in Area: -5.9% % Reduction in Volume: 25.9% Epithelialization: None Tunneling: No Undermining: Yes Starting Position (o'clock): 12 Ending Position (o'clock): 12 Maximum Distance: (cm) 0.9 Wound Description Classification: Grade 2 Wound Margin: Distinct, outline attached Exudate Amount: Medium Exudate Type: Serosanguineous Exudate Color: red, brown Foul Odor After Cleansing: No Slough/Fibrino Yes Wound Bed Granulation Amount: Large (67-100%) Exposed Structure Granulation Quality: Red, Pink Fascia Exposed: No Necrotic Amount: Small (1-33%) Fat Layer (Subcutaneous Tissue) Exposed: Yes Yan, Elenore Rota ([id number] Page 6 of 9 Necrotic Quality: Adherent Slough Tendon Exposed: No Muscle Exposed: No Joint Exposed: No Bone Exposed: No Periwound Skin Texture Texture Color No Abnormalities Noted: No No Abnormalities Noted: Yes Callus: Yes Temperature / Pain Temperature: No Abnormality Moisture No Abnormalities Noted: Yes Walker Notes Wound #1 (Metatarsal head fourth) Wound Laterality: Right Cleanser Soap and Water Discharge Instruction: May shower and wash wound with dial antibacterial soap and water prior to dressing change. Wound Cleanser Discharge Instruction: Cleanse the wound with wound cleanser prior to applying a clean dressing using gauze sponges, not tissue or cotton balls. Peri-Wound Care Topical Primary Dressing Maxorb Extra Ag+ Alginate Dressing, 2x2 (in/in) Discharge Instruction: Apply to wound bed as instructed Secondary Dressing Optifoam Non-Adhesive Dressing, 4x4 in Discharge Instruction: Apply over primary dressing as directed. Woven Gauze Sponge, Non-Sterile 4x4  in Discharge Instruction: Apply over primary dressing as directed. Secured With 76M Medipore H Soft Cloth Surgical T ape, 4 x 10 (in/yd) Discharge Instruction: Secure with tape as directed. Compression Wrap Compression Stockings Add-Ons Electronic Signature(s) Signed: 02/01/2023 6:21:07 PM By: Daniel Catholic RN Entered By: [name] on 02/01/2023 13:10:49 -------------------------------------------------------------------------------- Wound Assessment Details Patient Name: Date of Service: Bene, [name] NA LD 02/01/2023 12:45 PM Medical Record Number: [id number] Patient Account Number: [id number] Date of Birth/Sex:  Treating RN: Oct 13, 1961 (62 y.o. M) Primary Care Millette Halberstam: [name] Other Clinician: Referring Cherrelle Plante: Treating Tanganika Barradas/Extender: [name], [name]: 2 Wound Status Wound Number: 2 Primary Etiology: Diabetic Wound/Ulcer of the Lower Extremity Wound Location: Left T Second oe Wound Status: Open Wounding Event: Gradually Appeared Comorbid History: Hypertension, Type II Diabetes, Osteoarthritis, Neuropathy Date Acquired: 01/22/2023 Weeks Of Walker: 0 Clustered Wound: No Photos Myles, Elenore Rota ([id number] Page 7 of 9 Wound Measurements Length: (cm) 1.7 Width: (cm) 1.3 Depth: (cm) 0.2 Area: (cm) 1.736 Volume: (cm) 0.347 % Reduction in Area: % Reduction in Volume: Tunneling: No Undermining: No Wound Description Classification: Grade 1 Exudate Amount: Medium Exudate Type: Serosanguineous Exudate Color: red, brown Foul Odor After Cleansing: No Slough/Fibrino Yes Wound Bed Granulation Amount: Small (1-33%) Exposed Structure Granulation Quality: Red, Pink Fascia Exposed: No Necrotic Amount: Large (67-100%) Fat Layer (Subcutaneous Tissue) Exposed: Yes Necrotic Quality: Eschar, Adherent Slough Tendon Exposed: No Muscle Exposed: No Joint Exposed: No Bone Exposed:  No Periwound Skin Texture Texture Color No Abnormalities Noted: No No Abnormalities Noted: Yes Callus: Yes Temperature / Pain Temperature: No Abnormality Moisture No Abnormalities Noted: Yes Walker Notes Wound #2 (Toe Second) Wound Laterality: Left Cleanser Soap and Water Discharge Instruction: May shower and wash wound with dial antibacterial soap and water prior to dressing change. Wound Cleanser Discharge Instruction: Cleanse the wound with wound cleanser prior to applying a clean dressing using gauze sponges, not tissue or cotton balls. Peri-Wound Care Topical Primary Dressing Maxorb Extra Ag+ Alginate Dressing, 2x2 (in/in) Discharge Instruction: Apply to wound bed as instructed Secondary Dressing Optifoam Non-Adhesive Dressing, 4x4 in Discharge Instruction: Apply over primary dressing as directed. Woven Gauze Sponge, Non-Sterile 4x4 in Discharge Instruction: Apply over primary dressing as directed. Secured With 8M Medipore H Soft Cloth Surgical T ape, 4 x 10 (in/yd) Discharge Instruction: Secure with tape as directed. Compression Wrap Compression Stockings Havel, Elenore Rota ([id number]) [id number] Page 8 of 9 Add-Ons Electronic Signature(s) Signed: 02/01/2023 6:21:07 PM By: Daniel Catholic RN Entered By: [name] on 02/01/2023 13:10:06 -------------------------------------------------------------------------------- Wound Assessment Details Patient Name: Date of Service: [place], [name] NA LD 02/01/2023 12:45 PM Medical Record Number: [id number] Patient Account Number: [id number] Date of Birth/Sex: Treating RN: 1961-07-07 (62 y.o. M) Primary Care Analuisa Tudor: [name] Other Clinician: Referring Auriel Kist: Treating Ollie Delano/Extender: [name] Weeks in Walker: 2 Wound Status Wound Number: 3 Primary Etiology: Abscess Wound Location: Left, Medial Foot Wound Status: Open Wounding Event: Trauma Comorbid  History: Hypertension, Type II Diabetes, Osteoarthritis, Neuropathy Date Acquired: 01/30/2023 Weeks Of Walker: 0 Clustered Wound: No Photos Wound Measurements Length: (cm) 2.7 Width: (cm) 2.5 Depth: (cm) 4.2 Area: (cm) 5.301 Volume: (cm) 22.266 % Reduction in Area: % Reduction in Volume: Epithelialization: Small (1-33%) Tunneling: No Undermining: No Wound Description Classification: Full Thickness Without Exposed Support Structures Exudate Amount: Medium Exudate Type: Serosanguineous Exudate Color: red, brown Foul Odor After Cleansing: No Slough/Fibrino Yes Wound Bed Granulation Amount: Small (1-33%) Exposed Structure Granulation Quality: Red Fascia Exposed: No Necrotic Amount: Large (67-100%) Fat Layer (Subcutaneous Tissue) Exposed: Yes Necrotic Quality: Adherent Slough Tendon Exposed: No Muscle Exposed: No Joint Exposed: No Bone Exposed: No Periwound Skin Texture Texture Color No Abnormalities Noted: Yes No Abnormalities Noted: Yes Moisture Temperature / Pain No Abnormalities Noted: Yes Temperature: No Abnormality Walker Notes Wound #3 (Foot) Wound Laterality: Left, Medial Rachel, Kairi ([id number] Page 9 of 9 Cleanser Soap and Water Discharge Instruction: May shower and wash wound with dial antibacterial soap and water prior  to dressing change. Wound Cleanser Discharge Instruction: Cleanse the wound with wound cleanser prior to applying a clean dressing using gauze sponges, not tissue or cotton balls. Peri-Wound Care Topical Primary Dressing Maxorb Extra Ag+ Alginate Dressing, 2x2 (in/in) Discharge Instruction: Apply to wound bed as instructed Secondary Dressing Optifoam Non-Adhesive Dressing, 4x4 in Discharge Instruction: Apply over primary dressing as directed. Woven Gauze Sponge, Non-Sterile 4x4 in Discharge Instruction: Apply over primary dressing as directed. Secured With 71M Medipore H Soft Cloth Surgical  T ape, 4 x 10 (in/yd) Discharge Instruction: Secure with tape as directed. Compression Wrap Compression Stockings Add-Ons Electronic Signature(s) Signed: 02/01/2023 6:21:07 PM By: Daniel Catholic RN Entered By: [name] on 02/01/2023 13:51:10 -------------------------------------------------------------------------------- Vitals Details Patient Name: Date of Service: [name], [name] NA LD 02/01/2023 12:45 PM Medical Record Number: [id number] Patient Account Number: [id number] Date of Birth/Sex: Treating RN: Aug 24, 1961 (62 y.o. M) Primary Care Macarena Langseth: [name] Other Clinician: Referring Tory Septer: Treating Elizah Lydon/Extender: [name], [name]: 2 Vital Signs Time Taken: 12:53 Temperature (F): 98.4 Height (in): 72 Pulse (bpm): 120 Weight (lbs): 220 Respiratory Rate (breaths/min): 20 Body Mass Index (BMI): 29.8 Blood Pressure (mmHg): 190/100 Capillary Blood Glucose (mg/dl): 200 Reference Range: 80 - 120 mg / dl Electronic Signature(s) Signed: 02/01/2023 5:17:55 PM By: [name] Entered By: [name] on 02/01/2023 12:54:08

## 2023-02-02 NOTE — ED Triage Notes (Signed)
Pt arrives via POV c/o wound to the left foot that is tunneling. Has been following up with a wound nurse managing wound to the right foot who verbalized concern for new wound to left foot and recommended further evaluation. Currently taking doxycyline

## 2023-02-02 NOTE — ED Notes (Signed)
ED TO INPATIENT HANDOFF REPORT  Name/Age/Gender [name] 62 y.o. male  Code Status    Code Status Orders  (From admission, onward)           Start     Ordered   02/02/23 2221  Full code  Continuous       Question:  By:  Answer:  Consent: discussion documented in EHR   02/02/23 2220           Code Status History     This patient has a current code status but no historical code status.       Home/SNF/Other Home  Chief Complaint Osteomyelitis ([place]) [M86.9]  Level of Care/Admitting Diagnosis ED Disposition     ED Disposition  Admit   Condition  --   Comment  Hospital Area: [facility] [id number]  Level of Care: Telemetry [5]  Admit to tele based on following criteria: Other see comments  Comments: tachycardia  May admit patient to [facility] or [facility] if equivalent level of care is available:: Yes  Covid Evaluation: Confirmed COVID Negative  Diagnosis: Osteomyelitis ([place]) [id number]  Admitting Physician: Speed, [place]  Attending Physician: Quintella Baton [id number]  Certification:: I certify this patient will need inpatient services for at least 2 midnights  Estimated Length of Stay: 2          Medical History Past Medical History:  Diagnosis Date   Anxiety    Arthritis    Diabetes mellitus     Allergies Not on File  IV Location/Drains/Wounds Patient Lines/Drains/Airways Status     Active Line/Drains/Airways     Name Placement date Placement time Site Days   Peripheral IV 02/02/23 20 G Anterior;Right Hand 02/02/23  2030  Hand  less than 1            Labs/Imaging Results for orders placed or performed during the hospital encounter of 02/02/23 (from the past 48 hour(s))  Lactic acid, plasma     Status: None   Collection Time: 02/02/23  8:06 PM  Result Value Ref Range   Lactic Acid, Venous 1.7 0.5 - 1.9 mmol/L    Comment: Performed at [facility], [place] [address], [place],  [place] [address]  Comprehensive metabolic panel     Status: Abnormal   Collection Time: 02/02/23  8:06 PM  Result Value Ref Range   Sodium 131 (L) 135 - 145 mmol/L   Potassium 3.9 3.5 - 5.1 mmol/L   Chloride 96 (L) 98 - 111 mmol/L   CO2 21 (L) 22 - 32 mmol/L   Glucose, Bld 201 (H) 70 - 99 mg/dL    Comment: Glucose reference range applies only to samples taken after fasting for at least 8 hours.   BUN 19 8 - 23 mg/dL   Creatinine, Ser 1.26 (H) 0.61 - 1.24 mg/dL   Calcium 9.2 8.9 - 10.3 mg/dL   Total Protein 8.6 (H) 6.5 - 8.1 g/dL   Albumin 3.6 3.5 - 5.0 g/dL   AST 18 15 - 41 U/L   ALT 18 0 - 44 U/L   Alkaline Phosphatase 91 38 - 126 U/L   Total Bilirubin 0.7 0.3 - 1.2 mg/dL   GFR, Estimated >60 >60 mL/min    Comment: (NOTE) Calculated using the CKD-EPI Creatinine Equation (2021)    Anion gap 14 5 - 15    Comment: Performed at [facility], [place] [address], [place], [place] [address]  CBC with Differential  Status: Abnormal   Collection Time: 02/02/23  8:06 PM  Result Value Ref Range   WBC 17.1 (H) 4.0 - 10.5 K/uL   RBC 4.76 4.22 - 5.81 MIL/uL   Hemoglobin 13.1 13.0 - 17.0 g/dL   HCT 40.5 39.0 - 52.0 %   MCV 85.1 80.0 - 100.0 fL   MCH 27.5 26.0 - 34.0 pg   MCHC 32.3 30.0 - 36.0 g/dL   RDW 13.4 11.5 - 15.5 %   Platelets 377 150 - 400 K/uL   nRBC 0.0 0.0 - 0.2 %   Neutrophils Relative % 79 %   Neutro Abs 13.6 (H) 1.7 - 7.7 K/uL   Lymphocytes Relative 12 %   Lymphs Abs 2.1 0.7 - 4.0 K/uL   Monocytes Relative 6 %   Monocytes Absolute 1.0 0.1 - 1.0 K/uL   Eosinophils Relative 1 %   Eosinophils Absolute 0.2 0.0 - 0.5 K/uL   Basophils Relative 1 %   Basophils Absolute 0.1 0.0 - 0.1 K/uL   Immature Granulocytes 1 %   Abs Immature Granulocytes 0.10 (H) 0.00 - 0.07 K/uL    Comment: Performed at [facility] [place] [address], [place], [place] [address]   DG Foot Complete Left  Result Date: 02/02/2023 CLINICAL DATA:  Left foot infection EXAM:  LEFT FOOT - COMPLETE 3+ VIEW COMPARISON:  12/21/2022 FINDINGS: Progressive destruction of the proximal and distal phalanx of the left great toe centered at the interphalangeal joint. Prominent soft tissue swelling. No additional sites of bony erosion. No acute fracture or dislocation. There is bandaging material overlying the forefoot. IMPRESSION: Progressive destruction of the proximal and distal phalanx of the left great toe centered at the interphalangeal joint, compatible with osteomyelitis and septic arthritis. Electronically Signed   By: Davina Poke D.O.   On: 02/02/2023 20:05    Pending Labs Unresulted Labs (From admission, onward)     Start     Ordered   02/09/23 0500  Creatinine, serum  (enoxaparin (LOVENOX)    CrCl >/= 30 ml/min)  Weekly,   R     Comments: while on enoxaparin therapy    02/02/23 2220   02/03/23 [id number]  Basic metabolic panel  Tomorrow morning,   R        02/02/23 2220   02/03/23 0500  CBC with Differential/Platelet  Tomorrow morning,   R        02/02/23 2220   02/03/23 0500  Protime-INR  Tomorrow morning,   R        02/02/23 2220   02/02/23 2215  Hemoglobin A1c  Once,   URGENT       Comments: To assess prior glycemic control    02/02/23 2215   02/02/23 2020  Blood culture (routine x 2)  BLOOD CULTURE X 2,   R (with STAT occurrences)      02/02/23 2019            Vitals/Pain Today's Vitals   02/02/23 1924 02/02/23 2035 02/02/23 2045 02/02/23 2100  BP:  (!) 167/88 (!) 130/91 (!) 160/95  Pulse:  (!) 122 (!) 118 (!) 117  Resp:  (!) 25 13 17   Temp:      TempSrc:      SpO2:  96% 95% 98%  Weight:      Height:      PainSc: 7        Isolation Precautions No active isolations  Medications Medications  insulin aspart (novoLOG) injection 0-15 Units (has no  administration in time range)  insulin aspart (novoLOG) injection 0-5 Units (has no administration in time range)  morphine (PF) 2 MG/ML injection 2 mg (has no administration in time range)  0.9 %   sodium chloride infusion (has no administration in time range)  enoxaparin (LOVENOX) injection 40 mg (has no administration in time range)  acetaminophen (TYLENOL) tablet 650 mg (has no administration in time range)    Or  acetaminophen (TYLENOL) suppository 650 mg (has no administration in time range)  senna-docusate (Senokot-S) tablet 1 tablet (has no administration in time range)  ondansetron (ZOFRAN) tablet 4 mg (has no administration in time range)    Or  ondansetron (ZOFRAN) injection 4 mg (has no administration in time range)  insulin glargine-yfgn (SEMGLEE) injection 15 Units (has no administration in time range)  lisinopril (ZESTRIL) tablet 2.5 mg (has no administration in time range)  ALPRAZolam (XANAX) tablet 0.5 mg (has no administration in time range)  piperacillin-tazobactam (ZOSYN) IVPB 3.375 g (has no administration in time range)  vancomycin (VANCOREADY) IVPB 2000 mg/400 mL (has no administration in time range)  sodium chloride 0.9 % bolus 1,000 mL (0 mLs Intravenous Stopped 02/02/23 2225)  LORazepam (ATIVAN) injection 1 mg (1 mg Intravenous Given 02/02/23 2042)  piperacillin-tazobactam (ZOSYN) IVPB 3.375 g (0 g Intravenous Stopped 02/02/23 2224)  vancomycin (VANCOCIN) IVPB 1000 mg/200 mL premix (0 mg Intravenous Stopped 02/02/23 2253)  gadobutrol (GADAVIST) 1 MMOL/ML injection 10 mL (10 mLs Intravenous Contrast Given 02/02/23 2153)    Mobility walks with person assist

## 2023-02-02 NOTE — ED Triage Notes (Signed)
HR 144 in triage. Pt denies CP and SOB

## 2023-02-02 NOTE — H&P (Signed)
PCP:   Patient, [name]   Chief Complaint:  Left foot ulcer  HPI: This is a 62 year old male with past medical history of diabetes mellitus, anxiety, PAD, and diabetic foot ulcers.  He follows with wound care and podiatry.  Approximately 2 to 3 days ago he noted a rash on his left lower extremity/foot.  It started to hurt, then swell, then it turned into a volcano and busted open.  The latter occurred yesterday.  He has had significant pain which impedes his ability to ambulate.  He has not been able to sleep in 2 nights.  He is on doxycycline for 14 days, started 3/4 by his podiatrist.  He denies fevers or chills.  He came to the ER.  In the ER x-ray of the left lower extremity revealed Progressive destruction of the proximal and distal phalanx of the left great toe centered at the interphalangeal joint, compatible with osteomyelitis and septic arthritis.  Podiatry was contacted, they recommended MRI and admission.  Review of Systems:  The patient denies anorexia, fever, weight loss,, vision loss, decreased hearing, hoarseness, chest pain, syncope, dyspnea on exertion, peripheral edema, balance deficits, hemoptysis, abdominal pain, melena, hematochezia, severe indigestion/heartburn, hematuria, incontinence, genital sores, muscle weakness, suspicious skin lesions, transient blindness,  depression, unusual weight change, abnormal bleeding, enlarged lymph nodes, angioedema, and breast masses. Positives: Left lower extremity ulcer, difficulty walking,  Past Medical History: Past Medical History:  Diagnosis Date   Anxiety    Arthritis    Diabetes mellitus    Past Surgical History:  Procedure Laterality Date   VARICOSE VEIN SURGERY     bilateral    Medications: Prior to Admission medications   Medication Sig Start Date End Date Taking? Authorizing Provider  ALPRAZolam (XANAX) 0.5 MG tablet TAKE 1/2 TO 1 (ONE-HALF TO ONE) TABLET BY MOUTH AT BEDTIME AS NEEDED FOR ANXIETY OR SLEEP 11/30/20   Yes Just, Laurita Quint, FNP  aspirin 81 MG tablet Take 1 tablet (81 mg total) by mouth daily. 04/22/15  Yes Jaynee Eagles, PA-C  atorvastatin (LIPITOR) 20 MG tablet Take 1 tablet (20 mg total) by mouth daily. 04/22/20  Yes Jacelyn Pi, Lilia Argue, MD  colchicine 0.6 MG tablet Take 0.6 mg by mouth daily. 01/17/23  Yes [provider]  doxycycline (VIBRA-TABS) 100 MG tablet Take 1 tablet (100 mg total) by mouth 2 (two) times daily. 01/31/23  Yes Lorenda Peck, DPM  Dulaglutide (TRULICITY) 3 0000000 SOPN Inject 3 mg into the skin once a week.   Yes [provider]  gabapentin (NEURONTIN) 300 MG capsule Take 1 capsule (300 mg total) by mouth 3 (three) times daily. 01/03/23  Yes Standiford, Nena Alexander, DPM  latanoprost (XALATAN) 0.005 % ophthalmic solution Place 1 drop into both eyes at bedtime. 10/31/22  Yes [provider]  sildenafil (VIAGRA) 100 MG tablet Take by mouth. 01/12/23  Yes [provider]  amphetamine-dextroamphetamine (ADDERALL) 15 MG tablet Take 0.5-1 tablets by mouth daily. 11/30/20   Just, Laurita Quint, FNP  blood glucose meter kit and supplies KIT Per insurance preference. Check blood glucose once a day. Dx E11.9, Z79.4 01/22/20   Jacelyn Pi, Lilia Argue, MD  doxycycline (VIBRA-TABS) 100 MG tablet Take 1 tablet (100 mg total) by mouth 2 (two) times daily. Patient not taking: Reported on 02/02/2023 03/09/22   Wallene Huh, DPM  glucose blood test strip Test blood sugar daily. Dx code: 250.60. 08/09/16   [name] D, PA  insulin glargine (LANTUS SOLOSTAR) 100  UNIT/ML Solostar Pen Inject 16 Units into the skin at bedtime. 01/22/20   Jacelyn Pi, Lilia Argue, MD  Insulin Pen Needle (PEN NEEDLES) 32G X 6 MM MISC 1 each by Does not apply route at bedtime. 01/22/20   Daleen Squibb, MD  Lancets MISC Test blood sugar daily. Dx code 250.60 08/09/16   [name] D, PA  lisinopril (ZESTRIL) 5 MG tablet Take 0.5 tablets (2.5 mg total) by mouth daily. 04/22/20    Daleen Squibb, MD  omeprazole (PRILOSEC) 40 MG capsule Take 1 capsule (40 mg total) by mouth daily. 04/22/20   Daleen Squibb, MD  TRULICITY 1.5 0000000 SOPN ADMINISTER 1.5 MG UNDER THE SKIN 1 TIME A WEEK 01/28/21   Just, Laurita Quint, FNP  Omeprazole (PRILOSEC PO) Take 1 tablet by mouth every other day.  03/21/12  [provider]    Allergies:  No Known Allergies  Social History:  reports that he quit smoking about 6 years ago. His smoking use included cigars. He has never used smokeless tobacco. He reports that he does not drink alcohol and does not use drugs.  Family History: HTN  Physical Exam: Vitals:   02/02/23 1917 02/02/23 2035 02/02/23 2045 02/02/23 2100  BP: (!) 171/117 (!) 167/88 (!) 130/91 (!) 160/95  Pulse: (!) 144 (!) 122 (!) 118 (!) 117  Resp: 20 (!) 25 13 17   Temp: 98.4 F (36.9 C)     TempSrc: Oral     SpO2: 95% 96% 95% 98%  Weight: 99.8 kg     Height: 6' (1.829 m)       General:  Alert and oriented times three, well developed and nourished, no acute distress Eyes: PERRLA, pink conjunctiva, no scleral icterus ENT: Moist oral mucosa, neck supple, no thyromegaly Lungs: clear to ascultation, no wheeze, no crackles, no use of accessory muscles Cardiovascular: regular rate and rhythm, no regurgitation, no gallops, no murmurs. No carotid bruits, no JVD Abdomen: soft, positive BS, non-tender, non-distended, no organomegaly, not an acute abdomen Circulation: Unable to palpate DP/PT pulses.  Extremity warm Neuro: CN II - XII grossly intact, sensation intact Musculoskeletal: strength 5/5 all extremities, no clubbing, cyanosis or edema Skin: LLE draining boil in platar surface in bteen  the heel and ball of foot. Lots of deformities bilateral lower extremity.  Patient's left great very swollen Psych: Anxious patient  Labs on Admission:  Recent Labs    02/02/23 2006  NA 131*  K 3.9  CL 96*  CO2 21*  GLUCOSE 201*  BUN 19  CREATININE 1.26*   CALCIUM 9.2   Recent Labs    02/02/23 2006  AST 18  ALT 18  ALKPHOS 91  BILITOT 0.7  PROT 8.6*  ALBUMIN 3.6    Recent Labs    02/02/23 2006  WBC 17.1*  NEUTROABS 13.6*  HGB 13.1  HCT 40.5  MCV 85.1  PLT 377     Radiological Exams on Admission: DG Foot Complete Left  Result Date: 02/02/2023 CLINICAL DATA:  Left foot infection EXAM: LEFT FOOT - COMPLETE 3+ VIEW COMPARISON:  12/21/2022 FINDINGS: Progressive destruction of the proximal and distal phalanx of the left great toe centered at the interphalangeal joint. Prominent soft tissue swelling. No additional sites of bony erosion. No acute fracture or dislocation. There is bandaging material overlying the forefoot. IMPRESSION: Progressive destruction of the proximal and distal phalanx of the left great toe centered at the interphalangeal joint, compatible with osteomyelitis and septic arthritis. Electronically  Signed   By: Davina Poke D.O.   On: 02/02/2023 20:05    MRI left lower extremity IMPRESSION: 1. Loculated peripherally enhancing fluid collection about the plantar aspect of the base of the first metatarsal consistent with abscess measuring at least 1.5 x 3.0 x 3.0 cm. 2. Marrow edema of the base of the proximal phalanx of the first digit with joint effusion consistent with osteomyelitis with septic arthritis. 3. The interphalangeal joint of the first digit and distal phalanx are not included on this examination, which demonstrate advanced osseous erosion on the radiograph performed earlier on the same date, highly suspicious for osteomyelitis/septic arthritis. 4. Markedly increased signal of the plantar muscles, likely reactive secondary to ongoing infectious/inflammatory process.  Assessment/Plan Present on Admission:  Osteomyelitis ([facility]) left foot/ cellulits/abscess first metatarsal -N.p.o. at midnight -Podiatry consult Dr Jacqualyn Posey placed -zosyn and vanco initiated in the ER, will continue -Pain meds as  needed -ESR, CRP ordered   Diabetes mellitus -Poor control, sliding scale insulin,  -Lantus ordered   Generalized anxiety disorder -ativan PRN resumed   Hyperlipidemia -atorvastatin resumed  Rushil Kimbrell 02/02/2023, 10:27 PM

## 2023-02-02 NOTE — Progress Notes (Signed)
Pharmacy Antibiotic Note  [name] is a 62 y.o. male admitted on 02/02/2023 with left foot infection, follows at wound care clinic. Marland Kitchen  Pharmacy has been consulted for vancomycin and zosyn for osteomyelitis.  Plan: Vancomycin 2gm IV q24h (AUC 460.2, Scr 1.26) Zosyn 3.375g IV Q8H infused over 4hrs. Follow renal function, cultures and clinical course   Height: 6' (182.9 cm) Weight: 99.8 kg (220 lb) IBW/kg (Calculated) : 77.6  Temp (24hrs), Avg:98.4 F (36.9 C), Min:98.4 F (36.9 C), Max:98.4 F (36.9 C)  Recent Labs  Lab 02/02/23 2006  WBC 17.1*  CREATININE 1.26*  LATICACIDVEN 1.7    Estimated Creatinine Clearance: 75.3 mL/min (A) (by C-G formula based on SCr of 1.26 mg/dL (H)).    No Known Allergies  Antimicrobials this admission: 3/22 vanc >> 3/22 zosyn >>  Dose adjustments this admission:   Microbiology results: 3/22 BCx:   Thank you for allowing pharmacy to be a part of this patient's care. Dolly Rias RPh 02/02/2023, 10:35 PM

## 2023-02-02 NOTE — H&P (Incomplete Revision)
PCP:   Patient, [name]   Chief Complaint:    HPI:   Left foot has a terrible busted boil. 1sty noted 2-3 days ago. Red rash started to hurt. Could see pus under skin. Then turned into a volcano and busted open. It opened yest at the wound center  Maintained on doxy chronically  Able to walk but in terrible pain, no sleep 2 nights  No f/chills  Review of Systems:  The patient denies anorexia, fever, weight loss,, vision loss, decreased hearing, hoarseness, chest pain, syncope, dyspnea on exertion, peripheral edema, balance deficits, hemoptysis, abdominal pain, melena, hematochezia, severe indigestion/heartburn, hematuria, incontinence, genital sores, muscle weakness, suspicious skin lesions, transient blindness, difficulty walking, depression, unusual weight change, abnormal bleeding, enlarged lymph nodes, angioedema, and breast masses.  Past Medical History: Past Medical History:  Diagnosis Date   Anxiety    Arthritis    Diabetes mellitus    Past Surgical History:  Procedure Laterality Date   VARICOSE VEIN SURGERY     bilateral    Medications: Prior to Admission medications   Medication Sig Start Date End Date Taking? Authorizing Provider  ALPRAZolam (XANAX) 0.5 MG tablet TAKE 1/2 TO 1 (ONE-HALF TO ONE) TABLET BY MOUTH AT BEDTIME AS NEEDED FOR ANXIETY OR SLEEP 11/30/20  Yes Just, Laurita Quint, FNP  aspirin 81 MG tablet Take 1 tablet (81 mg total) by mouth daily. 04/22/15  Yes Jaynee Eagles, PA-C  atorvastatin (LIPITOR) 20 MG tablet Take 1 tablet (20 mg total) by mouth daily. 04/22/20  Yes Jacelyn Pi, Lilia Argue, MD  colchicine 0.6 MG tablet Take 0.6 mg by mouth daily. 01/17/23  Yes [provider]  doxycycline (VIBRA-TABS) 100 MG tablet Take 1 tablet (100 mg total) by mouth 2 (two) times daily. 01/31/23  Yes Lorenda Peck, DPM  Dulaglutide (TRULICITY) 3 0000000 SOPN Inject 3 mg into the skin once a week.   Yes [provider]  gabapentin (NEURONTIN) 300 MG capsule  Take 1 capsule (300 mg total) by mouth 3 (three) times daily. 01/03/23  Yes Standiford, Nena Alexander, DPM  latanoprost (XALATAN) 0.005 % ophthalmic solution Place 1 drop into both eyes at bedtime. 10/31/22  Yes [provider]  sildenafil (VIAGRA) 100 MG tablet Take by mouth. 01/12/23  Yes [provider]  amphetamine-dextroamphetamine (ADDERALL) 15 MG tablet Take 0.5-1 tablets by mouth daily. 11/30/20   Just, Laurita Quint, FNP  blood glucose meter kit and supplies KIT Per insurance preference. Check blood glucose once a day. Dx E11.9, Z79.4 01/22/20   Jacelyn Pi, Lilia Argue, MD  doxycycline (VIBRA-TABS) 100 MG tablet Take 1 tablet (100 mg total) by mouth 2 (two) times daily. Patient not taking: Reported on 02/02/2023 03/09/22   Wallene Huh, DPM  glucose blood test strip Test blood sugar daily. Dx code: 250.60. 08/09/16   [name] D, PA  insulin glargine (LANTUS SOLOSTAR) 100 UNIT/ML Solostar Pen Inject 16 Units into the skin at bedtime. 01/22/20   Jacelyn Pi, Lilia Argue, MD  Insulin Pen Needle (PEN NEEDLES) 32G X 6 MM MISC 1 each by Does not apply route at bedtime. 01/22/20   Daleen Squibb, MD  Lancets MISC Test blood sugar daily. Dx code 250.60 08/09/16   [name] D, PA  lisinopril (ZESTRIL) 5 MG tablet Take 0.5 tablets (2.5 mg total) by mouth daily. 04/22/20   Daleen Squibb, MD  omeprazole (PRILOSEC) 40 MG capsule Take 1 capsule (40 mg total) by mouth daily. 04/22/20  Jacelyn Pi, Lilia Argue, MD  TRULICITY 1.5 0000000 SOPN ADMINISTER 1.5 MG UNDER THE SKIN 1 TIME A WEEK 01/28/21   Just, Laurita Quint, FNP  Omeprazole (PRILOSEC PO) Take 1 tablet by mouth every other day.  03/21/12  [provider]    Allergies:  No Known Allergies  Social History:  reports that he quit smoking about 6 years ago. His smoking use included cigars. He has never used smokeless tobacco. He reports that he does not drink alcohol and does not use drugs.  Family  History: HTN  Physical Exam: Vitals:   02/02/23 1917 02/02/23 2035 02/02/23 2045 02/02/23 2100  BP: (!) 171/117 (!) 167/88 (!) 130/91 (!) 160/95  Pulse: (!) 144 (!) 122 (!) 118 (!) 117  Resp: 20 (!) 25 13 17   Temp: 98.4 F (36.9 C)     TempSrc: Oral     SpO2: 95% 96% 95% 98%  Weight: 99.8 kg     Height: 6' (1.829 m)       General:  Alert and oriented times three, well developed and nourished, no acute distress Eyes: PERRLA, pink conjunctiva, no scleral icterus ENT: Moist oral mucosa, neck supple, no thyromegaly Lungs: clear to ascultation, no wheeze, no crackles, no use of accessory muscles Cardiovascular: regular rate and rhythm, no regurgitation, no gallops, no murmurs. No carotid bruits, no JVD Abdomen: soft, positive BS, non-tender, non-distended, no organomegaly, not an acute abdomen GU: not examined Neuro: CN II - XII grossly intact, sensation intact Musculoskeletal: strength 5/5 all extremities, no clubbing, cyanosis or edema Skin: no rash, no subcutaneous crepitation, no decubitus Psych: appropriate patient   LLE draining boil in platar surface in bteen  the heel and ball of foot. Lots of took deformaties No DP/PD pulse. Extremeties warm and well perfused   Labs on Admission:  Recent Labs    02/02/23 2006  NA 131*  K 3.9  CL 96*  CO2 21*  GLUCOSE 201*  BUN 19  CREATININE 1.26*  CALCIUM 9.2   Recent Labs    02/02/23 2006  AST 18  ALT 18  ALKPHOS 91  BILITOT 0.7  PROT 8.6*  ALBUMIN 3.6    Recent Labs    02/02/23 2006  WBC 17.1*  NEUTROABS 13.6*  HGB 13.1  HCT 40.5  MCV 85.1  PLT 377     Radiological Exams on Admission: DG Foot Complete Left  Result Date: 02/02/2023 CLINICAL DATA:  Left foot infection EXAM: LEFT FOOT - COMPLETE 3+ VIEW COMPARISON:  12/21/2022 FINDINGS: Progressive destruction of the proximal and distal phalanx of the left great toe centered at the interphalangeal joint. Prominent soft tissue swelling. No additional sites  of bony erosion. No acute fracture or dislocation. There is bandaging material overlying the forefoot. IMPRESSION: Progressive destruction of the proximal and distal phalanx of the left great toe centered at the interphalangeal joint, compatible with osteomyelitis and septic arthritis. Electronically Signed   By: Davina Poke D.O.   On: 02/02/2023 20:05    Assessment/Plan Present on Admission:  Osteomyelitis [facility]) left foot/ cellulits - -Podiatry consult Dr Jacqualyn Posey placed -zosyn/vanco -MRI foot ordered -NPO MN -ABI's ordered   Generalized anxiety disorder -ativan PRN resumed  DM type 2 -SSI and lantus ordered   Hyperlipidemia -atorvastatin resumed  Deklyn Trachtenberg 02/02/2023, 10:27 PM

## 2023-02-03 ENCOUNTER — Other Ambulatory Visit: Payer: Self-pay

## 2023-02-03 ENCOUNTER — Inpatient Hospital Stay (HOSPITAL_COMMUNITY): Payer: 59 | Admitting: Anesthesiology

## 2023-02-03 ENCOUNTER — Inpatient Hospital Stay (HOSPITAL_COMMUNITY): Payer: 59

## 2023-02-03 ENCOUNTER — Encounter (HOSPITAL_COMMUNITY)
Admission: EM | Disposition: A | Discharge status: Home Health | Payer: Self-pay | Source: Home / Self Care | Attending: Internal Medicine

## 2023-02-03 ENCOUNTER — Encounter (HOSPITAL_COMMUNITY): Payer: Self-pay | Admitting: Family Medicine

## 2023-02-03 DIAGNOSIS — E118 Type 2 diabetes mellitus with unspecified complications: Secondary | ICD-10-CM | POA: Diagnosis not present

## 2023-02-03 DIAGNOSIS — I10 Essential (primary) hypertension: Secondary | ICD-10-CM | POA: Diagnosis not present

## 2023-02-03 DIAGNOSIS — E7849 Other hyperlipidemia: Secondary | ICD-10-CM | POA: Diagnosis not present

## 2023-02-03 DIAGNOSIS — E1169 Type 2 diabetes mellitus with other specified complication: Secondary | ICD-10-CM

## 2023-02-03 DIAGNOSIS — F411 Generalized anxiety disorder: Secondary | ICD-10-CM | POA: Diagnosis not present

## 2023-02-03 DIAGNOSIS — M869 Osteomyelitis, unspecified: Secondary | ICD-10-CM

## 2023-02-03 DIAGNOSIS — Z794 Long term (current) use of insulin: Secondary | ICD-10-CM

## 2023-02-03 DIAGNOSIS — L02612 Cutaneous abscess of left foot: Secondary | ICD-10-CM | POA: Diagnosis not present

## 2023-02-03 DIAGNOSIS — Z87891 Personal history of nicotine dependence: Secondary | ICD-10-CM

## 2023-02-03 HISTORY — PX: I & D EXTREMITY: SHX5045

## 2023-02-03 LAB — BASIC METABOLIC PANEL
Anion gap: 12 (ref 5–15)
BUN: 16 mg/dL (ref 8–23)
CO2: 24 mmol/L (ref 22–32)
Calcium: 9 mg/dL (ref 8.9–10.3)
Chloride: 99 mmol/L (ref 98–111)
Creatinine, Ser: 1.03 mg/dL (ref 0.61–1.24)
GFR, Estimated: >60 mL/min (ref >60)
Glucose, Bld: 141 mg/dL — ABNORMAL HIGH (ref 70–99)
Potassium: 3.5 mmol/L (ref 3.5–5.1)
Sodium: 135 mmol/L (ref 135–145)

## 2023-02-03 LAB — CBC WITH DIFFERENTIAL/PLATELET
Abs Immature Granulocytes: 0.09 10*3/uL — ABNORMAL HIGH (ref 0.00–0.07)
Basophils Absolute: 0.1 10*3/uL (ref 0.0–0.1)
Basophils Relative: 1 %
Eosinophils Absolute: 0.5 10*3/uL (ref 0.0–0.5)
Eosinophils Relative: 3 %
HCT: 39.1 % (ref 39.0–52.0)
Hemoglobin: 12.5 g/dL — ABNORMAL LOW (ref 13.0–17.0)
Immature Granulocytes: 1 %
Lymphocytes Relative: 19 %
Lymphs Abs: 2.8 10*3/uL (ref 0.7–4.0)
MCH: 27.5 pg (ref 26.0–34.0)
MCHC: 32 g/dL (ref 30.0–36.0)
MCV: 85.9 fL (ref 80.0–100.0)
Monocytes Absolute: 0.8 10*3/uL (ref 0.1–1.0)
Monocytes Relative: 6 %
Neutro Abs: 10.3 10*3/uL — ABNORMAL HIGH (ref 1.7–7.7)
Neutrophils Relative %: 70 %
Platelets: 344 10*3/uL (ref 150–400)
RBC: 4.55 MIL/uL (ref 4.22–5.81)
RDW: 13.4 % (ref 11.5–15.5)
WBC: 14.6 10*3/uL — ABNORMAL HIGH (ref 4.0–10.5)
nRBC: 0 % (ref 0.0–0.2)

## 2023-02-03 LAB — GLUCOSE, CAPILLARY
Glucose-Capillary: 117 mg/dL — ABNORMAL HIGH (ref 70–99)
Glucose-Capillary: 130 mg/dL — ABNORMAL HIGH (ref 70–99)
Glucose-Capillary: 145 mg/dL — ABNORMAL HIGH (ref 70–99)
Glucose-Capillary: 145 mg/dL — ABNORMAL HIGH (ref 70–99)
Glucose-Capillary: 166 mg/dL — ABNORMAL HIGH (ref 70–99)
Glucose-Capillary: 173 mg/dL — ABNORMAL HIGH (ref 70–99)
Glucose-Capillary: 212 mg/dL — ABNORMAL HIGH (ref 70–99)

## 2023-02-03 LAB — PROTIME-INR
INR: 1.2 (ref 0.8–1.2)
Prothrombin Time: 14.7 seconds (ref 11.4–15.2)

## 2023-02-03 LAB — C-REACTIVE PROTEIN: CRP: 12.1 mg/dL — ABNORMAL HIGH (ref <1.0)

## 2023-02-03 LAB — SEDIMENTATION RATE: Sed Rate: 57 mm/hr — ABNORMAL HIGH (ref 0–16)

## 2023-02-03 SURGERY — IRRIGATION AND DEBRIDEMENT EXTREMITY
Anesthesia: General | Site: Foot | Laterality: Left

## 2023-02-03 MED ORDER — LIDOCAINE HCL 1 % IJ SOLN
INTRAMUSCULAR | Status: DC | PRN
  Administered 2023-02-03: 10 mL

## 2023-02-03 MED ORDER — FENTANYL CITRATE (PF) 100 MCG/2ML IJ SOLN
INTRAMUSCULAR | Status: AC
  Filled 2023-02-03: qty 2

## 2023-02-03 MED ORDER — ACETAMINOPHEN 10 MG/ML IV SOLN
INTRAVENOUS | Status: DC | PRN
  Administered 2023-02-03: 1000 mg via INTRAVENOUS

## 2023-02-03 MED ORDER — VANCOMYCIN HCL 1000 MG IV SOLR
INTRAVENOUS | Status: AC
  Filled 2023-02-03: qty 20

## 2023-02-03 MED ORDER — PROPOFOL 10 MG/ML IV BOLUS
INTRAVENOUS | Status: AC
  Filled 2023-02-03: qty 20

## 2023-02-03 MED ORDER — LIDOCAINE HCL (PF) 1 % IJ SOLN
INTRAMUSCULAR | Status: AC
  Filled 2023-02-03: qty 30

## 2023-02-03 MED ORDER — LACTATED RINGERS IV SOLN: INTRAVENOUS | Status: DC | PRN

## 2023-02-03 MED ORDER — HYDROMORPHONE HCL 1 MG/ML IJ SOLN: 0.2500 mg | INTRAMUSCULAR | Status: DC | PRN

## 2023-02-03 MED ORDER — ONDANSETRON HCL 4 MG/2ML IJ SOLN
INTRAMUSCULAR | Status: AC
  Filled 2023-02-03: qty 2

## 2023-02-03 MED ORDER — SODIUM CHLORIDE 0.9 % IR SOLN
Status: DC | PRN
  Administered 2023-02-03: 3000 mL

## 2023-02-03 MED ORDER — OXYCODONE HCL 5 MG PO TABS: 5.0000 mg | ORAL_TABLET | Freq: Once | ORAL | Status: DC | PRN

## 2023-02-03 MED ORDER — MIDAZOLAM HCL 5 MG/5ML IJ SOLN
INTRAMUSCULAR | Status: DC | PRN
  Administered 2023-02-03: 2 mg via INTRAVENOUS

## 2023-02-03 MED ORDER — ACETAMINOPHEN 10 MG/ML IV SOLN
INTRAVENOUS | Status: AC
  Filled 2023-02-03: qty 100

## 2023-02-03 MED ORDER — CHLORHEXIDINE GLUCONATE CLOTH 2 % EX PADS: 6.0000 | MEDICATED_PAD | Freq: Once | CUTANEOUS | Status: DC

## 2023-02-03 MED ORDER — SUCCINYLCHOLINE CHLORIDE 200 MG/10ML IV SOSY
PREFILLED_SYRINGE | INTRAVENOUS | Status: AC
  Filled 2023-02-03: qty 10

## 2023-02-03 MED ORDER — OXYCODONE HCL 5 MG/5ML PO SOLN: 5.0000 mg | Freq: Once | ORAL | Status: DC | PRN

## 2023-02-03 MED ORDER — 0.9 % SODIUM CHLORIDE (POUR BTL) OPTIME
TOPICAL | Status: DC | PRN
  Administered 2023-02-03: 1000 mL

## 2023-02-03 MED ORDER — SUCCINYLCHOLINE CHLORIDE 200 MG/10ML IV SOSY
PREFILLED_SYRINGE | INTRAVENOUS | Status: DC | PRN
  Administered 2023-02-03: 140 mg via INTRAVENOUS

## 2023-02-03 MED ORDER — ONDANSETRON HCL 4 MG/2ML IJ SOLN
INTRAMUSCULAR | Status: DC | PRN
  Administered 2023-02-03: 4 mg via INTRAVENOUS

## 2023-02-03 MED ORDER — ONDANSETRON HCL 4 MG/2ML IJ SOLN: 4.0000 mg | Freq: Once | INTRAMUSCULAR | Status: DC | PRN

## 2023-02-03 MED ORDER — PHENYLEPHRINE 80 MCG/ML (10ML) SYRINGE FOR IV PUSH (FOR BLOOD PRESSURE SUPPORT)
PREFILLED_SYRINGE | INTRAVENOUS | Status: DC | PRN
  Administered 2023-02-03: 160 ug via INTRAVENOUS
  Administered 2023-02-03: 80 ug via INTRAVENOUS
  Administered 2023-02-03: 120 ug via INTRAVENOUS

## 2023-02-03 MED ORDER — LIDOCAINE 2% (20 MG/ML) 5 ML SYRINGE
INTRAMUSCULAR | Status: DC | PRN
  Administered 2023-02-03: 60 mg via INTRAVENOUS

## 2023-02-03 MED ORDER — PROPOFOL 10 MG/ML IV BOLUS
INTRAVENOUS | Status: DC | PRN
  Administered 2023-02-03: 200 mg via INTRAVENOUS

## 2023-02-03 MED ORDER — LIDOCAINE HCL (PF) 2 % IJ SOLN
INTRAMUSCULAR | Status: AC
  Filled 2023-02-03: qty 5

## 2023-02-03 MED ORDER — BUPIVACAINE HCL (PF) 0.5 % IJ SOLN
INTRAMUSCULAR | Status: DC | PRN
  Administered 2023-02-03: 10 mL

## 2023-02-03 MED ORDER — KETOROLAC TROMETHAMINE 30 MG/ML IJ SOLN
INTRAMUSCULAR | Status: AC
  Filled 2023-02-03: qty 1

## 2023-02-03 MED ORDER — VANCOMYCIN HCL 1250 MG/250ML IV SOLN
1250.0000 mg | Freq: Two times a day (BID) | INTRAVENOUS | Status: DC
  Administered 2023-02-03 – 2023-02-05 (×4): 1250 mg via INTRAVENOUS
  Filled 2023-02-03 (×6): qty 250

## 2023-02-03 MED ORDER — MIDAZOLAM HCL 2 MG/2ML IJ SOLN
INTRAMUSCULAR | Status: AC
  Filled 2023-02-03: qty 2

## 2023-02-03 MED ORDER — PROPOFOL 500 MG/50ML IV EMUL
INTRAVENOUS | Status: AC
  Filled 2023-02-03: qty 50

## 2023-02-03 MED ORDER — BUPIVACAINE HCL (PF) 0.5 % IJ SOLN
INTRAMUSCULAR | Status: AC
  Filled 2023-02-03: qty 30

## 2023-02-03 MED ORDER — CHLORHEXIDINE GLUCONATE CLOTH 2 % EX PADS
6.0000 | MEDICATED_PAD | Freq: Once | CUTANEOUS | Status: DC
  Administered 2023-02-03: 6 via TOPICAL

## 2023-02-03 MED ORDER — FENTANYL CITRATE (PF) 100 MCG/2ML IJ SOLN
INTRAMUSCULAR | Status: DC | PRN
  Administered 2023-02-03 (×2): 50 ug via INTRAVENOUS

## 2023-02-03 MED ORDER — KETOROLAC TROMETHAMINE 30 MG/ML IJ SOLN
INTRAMUSCULAR | Status: DC | PRN
  Administered 2023-02-03: 30 mg via INTRAVENOUS

## 2023-02-03 MED ORDER — INSULIN ASPART 100 UNIT/ML IJ SOLN
0.0000 [IU] | INTRAMUSCULAR | Status: DC
  Administered 2023-02-03 (×2): 3 [IU] via SUBCUTANEOUS
  Administered 2023-02-03: 2 [IU] via SUBCUTANEOUS
  Administered 2023-02-04 (×2): 3 [IU] via SUBCUTANEOUS
  Administered 2023-02-04: 2 [IU] via SUBCUTANEOUS
  Administered 2023-02-04: 5 [IU] via SUBCUTANEOUS

## 2023-02-03 SURGICAL SUPPLY — 60 items
APL PRP STRL LF DISP 70% ISPRP (MISCELLANEOUS) ×1
BLADE SURG 15 STRL LF DISP TIS (BLADE) ×1 IMPLANT
BLADE SURG 15 STRL SS (BLADE) ×1
BNDG CMPR 5X3 KNIT ELC UNQ LF (GAUZE/BANDAGES/DRESSINGS) ×1
BNDG CMPR 9X4 STRL LF SNTH (GAUZE/BANDAGES/DRESSINGS) ×1
BNDG ELASTIC 3INX 5YD STR LF (GAUZE/BANDAGES/DRESSINGS) ×1 IMPLANT
BNDG ELASTIC 4X5.8 VLCR STR LF (GAUZE/BANDAGES/DRESSINGS) ×1 IMPLANT
BNDG ESMARK 4X9 LF (GAUZE/BANDAGES/DRESSINGS) ×1 IMPLANT
BNDG GAUZE DERMACEA FLUFF 4 (GAUZE/BANDAGES/DRESSINGS) ×1 IMPLANT
BNDG GZE DERMACEA 4 6PLY (GAUZE/BANDAGES/DRESSINGS) ×1
CHLORAPREP W/TINT 26 (MISCELLANEOUS) ×1 IMPLANT
CNTNR URN SCR LID CUP LEK RST (MISCELLANEOUS) ×1 IMPLANT
CONT SPEC 4OZ STRL OR WHT (MISCELLANEOUS) ×1
COVER BACK TABLE 60X90IN (DRAPES) ×1 IMPLANT
CUFF TOURN SGL QUICK 18 (TOURNIQUET CUFF) IMPLANT
CUFF TOURN SGL QUICK 18X4 (TOURNIQUET CUFF) ×1 IMPLANT
CUFF TOURN SGL QUICK 24 (TOURNIQUET CUFF) ×1
CUFF TRNQT CYL 24X4X16.5-23 (TOURNIQUET CUFF) ×1 IMPLANT
DRAPE 3/4 80X56 (DRAPES) ×1 IMPLANT
DRAPE EXTREMITY T 121X128X90 (DISPOSABLE) ×1 IMPLANT
DRAPE SHEET LG 3/4 BI-LAMINATE (DRAPES) ×1 IMPLANT
DRAPE U-SHAPE 47X51 STRL (DRAPES) ×1 IMPLANT
ELECT REM PT RETURN 15FT ADLT (MISCELLANEOUS) ×1 IMPLANT
GAUZE PACKING IODOFORM 1/4X15 (PACKING) IMPLANT
GAUZE SPONGE 4X4 12PLY STRL (GAUZE/BANDAGES/DRESSINGS) ×1 IMPLANT
GAUZE XEROFORM 1X8 LF (GAUZE/BANDAGES/DRESSINGS) ×1 IMPLANT
GAUZE XEROFORM 5X9 LF (GAUZE/BANDAGES/DRESSINGS) IMPLANT
GLOVE BIO SURGEON STRL SZ7.5 (GLOVE) ×1 IMPLANT
GLOVE BIOGEL PI IND STRL 8 (GLOVE) ×1 IMPLANT
GLOVE ECLIPSE 8.0 STRL XLNG CF (GLOVE) ×1 IMPLANT
GOWN STRL REUS W/ TWL XL LVL3 (GOWN DISPOSABLE) ×1 IMPLANT
GOWN STRL REUS W/TWL XL LVL3 (GOWN DISPOSABLE) ×1
KIT BASIN OR (CUSTOM PROCEDURE TRAY) ×1 IMPLANT
KIT TURNOVER KIT A (KITS) IMPLANT
MANIFOLD NEPTUNE II (INSTRUMENTS) ×1 IMPLANT
NDL BIOPSY JAMSHIDI 11X6 (NEEDLE) IMPLANT
NDL HYPO 25X1 1.5 SAFETY (NEEDLE) ×1 IMPLANT
NEEDLE BIOPSY JAMSHIDI 11X6 (NEEDLE) ×1 IMPLANT
NEEDLE HYPO 25X1 1.5 SAFETY (NEEDLE) ×1 IMPLANT
NS IRRIG 1000ML POUR BTL (IV SOLUTION) ×1 IMPLANT
PADDING CAST ABS COTTON 4X4 ST (CAST SUPPLIES) ×1 IMPLANT
PENCIL SMOKE EVACUATOR (MISCELLANEOUS) IMPLANT
SET IRRIG Y TYPE TUR BLADDER L (SET/KITS/TRAYS/PACK) ×1 IMPLANT
SPONGE T-LAP 4X18 ~~LOC~~+RFID (SPONGE) ×1 IMPLANT
STAPLER VISISTAT 35W (STAPLE) ×1 IMPLANT
STOCKINETTE 6  STRL (DRAPES) ×1
STOCKINETTE 6 STRL (DRAPES) ×1 IMPLANT
SUCTION FRAZIER HANDLE 10FR (MISCELLANEOUS) ×1
SUCTION TUBE FRAZIER 10FR DISP (MISCELLANEOUS) ×1 IMPLANT
SUT ETHILON 3 0 PS 1 (SUTURE) ×1 IMPLANT
SUT ETHILON 4 0 PS 2 18 (SUTURE) ×1 IMPLANT
SUT MNCRL AB 3-0 PS2 18 (SUTURE) ×1 IMPLANT
SUT MNCRL AB 4-0 PS2 18 (SUTURE) ×1 IMPLANT
SUT VIC AB 2-0 SH 27 (SUTURE) ×1
SUT VIC AB 2-0 SH 27XBRD (SUTURE) ×1 IMPLANT
SYR BULB EAR ULCER 3OZ GRN STR (SYRINGE) ×1 IMPLANT
SYR CONTROL 10ML LL (SYRINGE) ×1 IMPLANT
TUBE IRRIGATION SET MISONIX (TUBING) ×1 IMPLANT
UNDERPAD 30X36 HEAVY ABSORB (UNDERPADS AND DIAPERS) ×1 IMPLANT
YANKAUER SUCT BULB TIP NO VENT (SUCTIONS) ×1 IMPLANT

## 2023-02-03 NOTE — Brief Op Note (Signed)
02/03/2023  11:21 AM  PATIENT:  [name]  62 y.o. male  PRE-OPERATIVE DIAGNOSIS:  LEFT FOOT ABSCESS; OSTEOMYELITIS  POST-OPERATIVE DIAGNOSIS:  LEFT FOOT ABSCESS; OSTEOMYELITIS  PROCEDURE:  Procedure(s): IRRIGATION AND DEBRIDEMENT LEFT FOOT AND BONE BIOPSY (Left)  SURGEON:  Surgeon(s) and Role:    * Trula Slade, DPM - Primary  PHYSICIAN ASSISTANT:   ASSISTANTS: none   ANESTHESIA:   general  EBL:  25 mL   BLOOD ADMINISTERED:none  DRAINS: none   LOCAL MEDICATIONS USED:  MARCAINE   , BUPIVICAINE , and Amount: 20 ml  SPECIMEN:  Source of Specimen:  Hallux bone for micro, wound culture (abscess)  DISPOSITION OF SPECIMEN:  PATHOLOGY  COUNTS:  YES  TOURNIQUET:  * No tourniquets in log *  DICTATION: .Dragon Dictation  PLAN OF CARE: Admit to inpatient   PATIENT DISPOSITION:  PACU - hemodynamically stable.   Delay start of Pharmacological VTE agent (>24hrs) due to surgical blood loss or risk of bleeding: no  Intraoperative findings: Hallux bone is soft c/w OM. I&D arch of the foot and large abscess noted. 3 separate incisions made. Debrided necrotic, nonviable tissue. Quite a bit of nonviable tendon was removed from the arch of the foot. After debridement, no further purulence. Packed wounds open. Will plan for dressing change and await clinical response for possible return to the OR early next week if needed.

## 2023-02-03 NOTE — Consult Note (Addendum)
Reason for Consult:Left foot osteomyelitis Referring Physician: Dr. Nira Conn, MD  [name] is an 62 y.o. male.  HPI: 62 year old male with past medical history of diabetes mellitus, anxiety, PAD, and diabetic foot ulcers.  He follows with wound care and as well as with [place]. He has last seen by Dr. Blenda Mounts.  Approximately 2 to 3 days ago he noted a rash on his left lower extremity/foot.  It started to hurt, then swell, then it turned into a volcano and busted open.  He states that after open it has not been hurting as much.  He denies any fevers or chills currently. Past Medical History:  Diagnosis Date   Anxiety    Arthritis    Diabetes mellitus     Past Surgical History:  Procedure Laterality Date   VARICOSE VEIN SURGERY     bilateral    History reviewed. No pertinent family history.  Social History:  reports that he quit smoking about 6 years ago. His smoking use included cigars. He has never used smokeless tobacco. He reports that he does not drink alcohol and does not use drugs.  Allergies: Not on File  Medications: I have reviewed the patient's current medications.  Results for orders placed or performed during the hospital encounter of 02/02/23 (from the past 48 hour(s))  Lactic acid, plasma     Status: None   Collection Time: 02/02/23  8:06 PM  Result Value Ref Range   Lactic Acid, Venous 1.7 0.5 - 1.9 mmol/L    Comment: Performed at [facility], [place] [address], [place], [place] [address]  Comprehensive metabolic panel     Status: Abnormal   Collection Time: 02/02/23  8:06 PM  Result Value Ref Range   Sodium 131 (L) 135 - 145 mmol/L   Potassium 3.9 3.5 - 5.1 mmol/L   Chloride 96 (L) 98 - 111 mmol/L   CO2 21 (L) 22 - 32 mmol/L   Glucose, Bld 201 (H) 70 - 99 mg/dL    Comment: Glucose reference range applies only to samples taken after fasting for at least 8 hours.   BUN 19 8 - 23 mg/dL   Creatinine, Ser 1.26 (H) 0.61 - 1.24 mg/dL   Calcium 9.2 8.9  - 10.3 mg/dL   Total Protein 8.6 (H) 6.5 - 8.1 g/dL   Albumin 3.6 3.5 - 5.0 g/dL   AST 18 15 - 41 U/L   ALT 18 0 - 44 U/L   Alkaline Phosphatase 91 38 - 126 U/L   Total Bilirubin 0.7 0.3 - 1.2 mg/dL   GFR, Estimated >60 >60 mL/min    Comment: (NOTE) Calculated using the CKD-EPI Creatinine Equation (2021)    Anion gap 14 5 - 15    Comment: Performed at [facility], [place] [address], [place], [place] [address]  CBC with Differential     Status: Abnormal   Collection Time: 02/02/23  8:06 PM  Result Value Ref Range   WBC 17.1 (H) 4.0 - 10.5 K/uL   RBC 4.76 4.22 - 5.81 MIL/uL   Hemoglobin 13.1 13.0 - 17.0 g/dL   HCT 40.5 39.0 - 52.0 %   MCV 85.1 80.0 - 100.0 fL   MCH 27.5 26.0 - 34.0 pg   MCHC 32.3 30.0 - 36.0 g/dL   RDW 13.4 11.5 - 15.5 %   Platelets 377 150 - 400 K/uL   nRBC 0.0 0.0 - 0.2 %   Neutrophils Relative % 79 %   Neutro Abs  13.6 (H) 1.7 - 7.7 K/uL   Lymphocytes Relative 12 %   Lymphs Abs 2.1 0.7 - 4.0 K/uL   Monocytes Relative 6 %   Monocytes Absolute 1.0 0.1 - 1.0 K/uL   Eosinophils Relative 1 %   Eosinophils Absolute 0.2 0.0 - 0.5 K/uL   Basophils Relative 1 %   Basophils Absolute 0.1 0.0 - 0.1 K/uL   Immature Granulocytes 1 %   Abs Immature Granulocytes 0.10 (H) 0.00 - 0.07 K/uL    Comment: Performed at [facility], [place] [address] [place], [place] [id number]  Basic metabolic panel     Status: Abnormal   Collection Time: 02/03/23 12:44 AM  Result Value Ref Range   Sodium 135 135 - 145 mmol/L   Potassium 3.5 3.5 - 5.1 mmol/L   Chloride 99 98 - 111 mmol/L   CO2 24 22 - 32 mmol/L   Glucose, Bld 141 (H) 70 - 99 mg/dL    Comment: Glucose reference range applies only to samples taken after fasting for at least 8 hours.   BUN 16 8 - 23 mg/dL   Creatinine, Ser 1.03 0.61 - 1.24 mg/dL   Calcium 9.0 8.9 - 10.3 mg/dL   GFR, Estimated >60 >60 mL/min    Comment: (NOTE) Calculated using the CKD-EPI Creatinine Equation (2021)     Anion gap 12 5 - 15    Comment: Performed at [facility], [place] [address] [place], [place] [address]  CBC with Differential/Platelet     Status: Abnormal   Collection Time: 02/03/23 12:44 AM  Result Value Ref Range   WBC 14.6 (H) 4.0 - 10.5 K/uL   RBC 4.55 4.22 - 5.81 MIL/uL   Hemoglobin 12.5 (L) 13.0 - 17.0 g/dL   HCT 39.1 39.0 - 52.0 %   MCV 85.9 80.0 - 100.0 fL   MCH 27.5 26.0 - 34.0 pg   MCHC 32.0 30.0 - 36.0 g/dL   RDW 13.4 11.5 - 15.5 %   Platelets 344 150 - 400 K/uL   nRBC 0.0 0.0 - 0.2 %   Neutrophils Relative % 70 %   Neutro Abs 10.3 (H) 1.7 - 7.7 K/uL   Lymphocytes Relative 19 %   Lymphs Abs 2.8 0.7 - 4.0 K/uL   Monocytes Relative 6 %   Monocytes Absolute 0.8 0.1 - 1.0 K/uL   Eosinophils Relative 3 %   Eosinophils Absolute 0.5 0.0 - 0.5 K/uL   Basophils Relative 1 %   Basophils Absolute 0.1 0.0 - 0.1 K/uL   Immature Granulocytes 1 %   Abs Immature Granulocytes 0.09 (H) 0.00 - 0.07 K/uL    Comment: Performed at [facility], [place] [address] [place], [place] [address]  Protime-INR     Status: None   Collection Time: 02/03/23 12:44 AM  Result Value Ref Range   Prothrombin Time 14.7 11.4 - 15.2 seconds   INR 1.2 0.8 - 1.2    Comment: (NOTE) INR goal varies based on device and disease states. Performed at [facility], [place] [address], [place], [place] [address]   Glucose, capillary     Status: Abnormal   Collection Time: 02/03/23 12:49 AM  Result Value Ref Range   Glucose-Capillary 130 (H) 70 - 99 mg/dL    Comment: Glucose reference range applies only to samples taken after fasting for at least 8 hours.  Glucose, capillary     Status: Abnormal   Collection Time: 02/03/23  8:03 AM  Result Value Ref Range   Glucose-Capillary 117 (H) 70 - 99 mg/dL    Comment: Glucose reference range applies only to samples taken after fasting for at least 8 hours.   Comment 1 Notify RN     MR FOOT LEFT W WO  CONTRAST  Result Date: 02/02/2023 CLINICAL DATA:  Soft tissue infection suspected. EXAM: MRI OF THE LEFT FOREFOOT WITHOUT AND WITH CONTRAST TECHNIQUE: Multiplanar, multisequence MR imaging of the forefoot was performed both before and after administration of intravenous contrast. CONTRAST:  34mL GADAVIST GADOBUTROL 1 MMOL/ML IV SOLN COMPARISON:  Radiographs dated February 02, 2023. FINDINGS: Bones/Joint/Cartilage There is marrow edema of the base of the proximal phalanx of the first digit with joint effusion. The interphalangeal joint of the first digit and distal phalanx are not included on this examination, which demonstrate advanced osseous erosion on the radiograph performed earlier on the same date, highly suspicious for septic arthritis. Ligaments Lisfranc ligament is intact. There is thickening of the medial collateral ligament at the first metatarsophalangeal joint. Remaining collateral ligaments appear intact. Muscles and Tendons There is a loculated peripherally enhancing fluid collection about the plantar aspect of the base of the first metatarsal wound about the medial aspect of the foot (series 10, image 18) consistent with abscess. This abscess measures at least 1.5 x 3.0 x 3.0 cm. There is markedly increased signal of the plantar muscles, likely reactive secondary to ongoing infectious/inflammatory process. Generalized soft tissue edema about the foot. Soft tissues Generalized soft tissue swelling about the medial and dorsal aspect of the foot. IMPRESSION: 1. Loculated peripherally enhancing fluid collection about the plantar aspect of the base of the first metatarsal consistent with abscess measuring at least 1.5 x 3.0 x 3.0 cm. 2. Marrow edema of the base of the proximal phalanx of the first digit with joint effusion consistent with osteomyelitis with septic arthritis. 3. The interphalangeal joint of the first digit and distal phalanx are not included on this examination, which demonstrate advanced  osseous erosion on the radiograph performed earlier on the same date, highly suspicious for osteomyelitis/septic arthritis. 4. Markedly increased signal of the plantar muscles, likely reactive secondary to ongoing infectious/inflammatory process. Electronically Signed   By: Keane Police D.O.   On: 02/02/2023 23:03   DG Foot Complete Left  Result Date: 02/02/2023 CLINICAL DATA:  Left foot infection EXAM: LEFT FOOT - COMPLETE 3+ VIEW COMPARISON:  12/21/2022 FINDINGS: Progressive destruction of the proximal and distal phalanx of the left great toe centered at the interphalangeal joint. Prominent soft tissue swelling. No additional sites of bony erosion. No acute fracture or dislocation. There is bandaging material overlying the forefoot. IMPRESSION: Progressive destruction of the proximal and distal phalanx of the left great toe centered at the interphalangeal joint, compatible with osteomyelitis and septic arthritis. Electronically Signed   By: Davina Poke D.O.   On: 02/02/2023 20:05    Review of Systems Blood pressure 135/75, pulse 93, temperature 98.4 F (36.9 C), temperature source Oral, resp. rate 16, height 6' (1.829 m), weight 99.8 kg, SpO2 97 %. Physical Exam General: AAO x3, NAD  Dermatological: There is edema and erythema present of the hallux as well as the arch of the foot and there is a fluctuant abscess noted along the arch of the foot.  There is no crepitation.  There is erythema, cellulitis present to the arch of the foot as well and along the medial aspect of the foot.  Full-thickness ulceration noted on the plantar aspect of right  foot submetatarsal without ascending erythema, ascending cellulitis.  No fluctuance or crepitation.  No malodor  Vascular: DP pulse palpable  Neruologic: Sensation decreased  Musculoskeletal: Mild pain on exam to the arch of the foot  Assessment/Plan: Osteomyelitis, abscess left foot  -I reviewed the MRI with him which shoes abscess, osteomyelitis  noted.  I discussed the conservative as well as surgical options.  I recommended I&D, amputation of the big toe.  After discussion he would not agree to proceed with amputation of the toe.  I discussed with him the risks of keeping the toe with infection is currently despite of infection, loss of foot, sepsis.  He understands the risks and states that he will not proceed with amputation.  Will proceed with I&D of the abscess as well as bone biopsy of the big toe understanding the risks of surgery he is still at high risk of limb loss and the infection.  He has been NPO.  Will plan for surgery this morning.  Also discussed possible need for return to the OR early next week for possible repeat washout. He is anxious to get out of the hospital due to his cats but I tried to stress the importance of this infection and risk of limb loss.  -ABI done on 01/26/2023- normal  -Right foot x-ray  Trula Slade 02/03/2023, 9:00 AM

## 2023-02-03 NOTE — Progress Notes (Signed)
Pharmacy Antibiotic Note  [name] is a 62 y.o. male admitted on 02/02/2023 with  diabetic foot infection, concern for OM .  Pharmacy has been consulted for vancomycin and piperacillin/tazobactam dosing.  Pt underwent I&D and bone biopsy on 3/23.  Today, 02/03/23 WBC trending down, but remains elevated SCr improved CRP, Sed rate elevated  Plan: Piperacillin/tazobactam 3.375 g IV q8h EI Vancomycin 1250 mg IV q12h for estimated AUC of 475. Goal AUC 400-550. Check levels at steady state as needed.  Monitor renal function, culture data  Height: 6' (182.9 cm) Weight: 99.8 kg (220 lb) IBW/kg (Calculated) : 77.6  Temp (24hrs), Avg:98 F (36.7 C), Min:97.4 F (36.3 C), Max:98.4 F (36.9 C)  Recent Labs  Lab 02/02/23 2006 02/03/23 0044  WBC 17.1* 14.6*  CREATININE 1.26* 1.03  LATICACIDVEN 1.7  --     Estimated Creatinine Clearance: 92.1 mL/min (by C-G formula based on SCr of 1.03 mg/dL).    Not on [place], PharmD 02/03/2023 1:21 PM

## 2023-02-03 NOTE — Anesthesia Preprocedure Evaluation (Addendum)
Anesthesia Evaluation  Patient identified by MRN, date of birth, ID band Patient awake    Reviewed: Allergy & Precautions, H&P , NPO status , Patient's Chart, lab work & pertinent test results  Airway Mallampati: III  TM Distance: >3 FB Neck ROM: Full    Dental  (+) Dental Advisory Given, Poor Dentition   Pulmonary former smoker   Pulmonary exam normal breath sounds clear to auscultation       Cardiovascular hypertension (148/72 preop), Pt. on medications Normal cardiovascular exam Rhythm:Regular Rate:Normal     Neuro/Psych  PSYCHIATRIC DISORDERS Anxiety Depression    negative neurological ROS     GI/Hepatic Neg liver ROS,GERD  Controlled,,  Endo/Other  diabetes, Poorly Controlled, Type 2, Insulin Dependent    Renal/GU negative Renal ROS  negative genitourinary   Musculoskeletal  (+) Arthritis , Osteoarthritis,    Abdominal   Peds negative pediatric ROS (+)  Hematology negative hematology ROS (+) Hb 12.5   Anesthesia Other Findings Trulicity- LD [id number]  Reproductive/Obstetrics negative OB ROS                              Anesthesia Physical Anesthesia Plan  ASA: 3  Anesthesia Plan: General   Post-op Pain Management: Ofirmev IV (intra-op)* and Toradol IV (intra-op)*   Induction: Intravenous and Rapid sequence  PONV Risk Score and Plan: 2 and Ondansetron, Midazolam and Treatment may vary due to age or medical condition  Airway Management Planned: Oral ETT  Additional Equipment: None  Intra-op Plan:   Post-operative Plan: Extubation in OR  Informed Consent: I have reviewed the patients History and Physical, chart, labs and discussed the procedure including the risks, benefits and alternatives for the proposed anesthesia with the patient or authorized representative who has indicated his/her understanding and acceptance.     Dental advisory given  Plan Discussed with:  CRNA  Anesthesia Plan Comments: (Has not been off trulicity for appropriate amount of time- will RSI/ETT)        Anesthesia Quick Evaluation

## 2023-02-03 NOTE — Progress Notes (Signed)
[name] ([id number]) [id number] Page 1 of 11 Visit Report for 02/01/2023 Chief Complaint Document Details Patient Name: Date of Service: [name], [name] NA LD 02/01/2023 12:45 PM Medical Record Number: [id number] Patient Account Number: [id number] Date of Birth/Sex: Treating RN: [birth year] (62 y.o. M) Primary Care Provider: Hiram Comber Other Clinician: Referring Provider: Treating Provider/Extender: Billey Co Weeks in Treatment: 2 Information Obtained from: Patient Chief Complaint Patients presents for treatment of an open diabetic ulcer Electronic Signature(s) Signed: 02/01/2023 2:59:16 PM By: Fredirick Maudlin MD FACS Entered By: Fredirick Maudlin on 02/01/2023 14:59:16 -------------------------------------------------------------------------------- Debridement Details Patient Name: Date of Service: Daniel Picking, DO NA LD 02/01/2023 12:45 PM Medical Record Number: [id number] Patient Account Number: [id number] Date of Birth/Sex: Treating RN: 1961/02/15 (62 y.o. Collene Gobble Primary Care Provider: Hiram Comber Other Clinician: Referring Provider: Treating Provider/Extender: Billey Co Weeks in Treatment: 2 Debridement Performed for Assessment: Wound #1 Right Metatarsal head fourth Performed By: Physician Fredirick Maudlin, MD Debridement Type: Debridement Severity of Tissue Pre Debridement: Fat layer exposed Level of Consciousness (Pre-procedure): Awake and Alert Pre-procedure Verification/Time Out Yes - 13:29 Taken: Start Time: 13:29 Pain Control: Lidocaine 4% T opical Solution T Area Debrided (L x W): otal 1.4 (cm) x 0.9 (cm) = 1.26 (cm) Tissue and other material debrided: Non-Viable, Callus, Biofilm Level: Non-Viable Tissue Debridement Description: Selective/Open Wound Instrument: Curette Specimen: Tissue Culture Number of Specimens T aken: 1 Bleeding: Minimum Hemostasis Achieved:  Pressure End Time: 13:30 Procedural Pain: 0 Post Procedural Pain: 0 Response to Treatment: Procedure was tolerated well Level of Consciousness (Post- Awake and Alert procedure): Post Debridement Measurements of Total Wound Length: (cm) 1.4 Width: (cm) 0.9 Depth: (cm) 0.7 Volume: (cm) 0.693 Character of Wound/Ulcer Post Debridement: Improved Severity of Tissue Post Debridement: Fat layer exposed Post Procedure Diagnosis Same as Pre-procedure Notes Scribed for Dr. Celine Ahr by J.Scotton Destrehan, Elenore Rota ([id number]) [id number] Page 2 of 11 Electronic Signature(s) Signed: 02/01/2023 4:25:44 PM By: Fredirick Maudlin MD FACS Signed: 02/01/2023 6:21:07 PM By: Dellie Catholic RN Entered By: Dellie Catholic on 02/01/2023 13:42:38 -------------------------------------------------------------------------------- Debridement Details Patient Name: Date of Service: Daniel Picking, DO NA LD 02/01/2023 12:45 PM Medical Record Number: [id number] Patient Account Number: [id number] Date of Birth/Sex: Treating RN: [birth year] (62 y.o. Collene Gobble Primary Care Provider: Hiram Comber Other Clinician: Referring Provider: Treating Provider/Extender: Billey Co Weeks in Treatment: 2 Debridement Performed for Assessment: Wound #2 Left T Second oe Performed By: Physician Fredirick Maudlin, MD Debridement Type: Debridement Severity of Tissue Pre Debridement: Fat layer exposed Level of Consciousness (Pre-procedure): Awake and Alert Pre-procedure Verification/Time Out Yes - 13:29 Taken: Start Time: 13:29 Pain Control: Lidocaine 4% T opical Solution T Area Debrided (L x W): otal 1.7 (cm) x 1.3 (cm) = 2.21 (cm) Tissue and other material debrided: Non-Viable, Callus, Slough, Slough Level: Non-Viable Tissue Debridement Description: Selective/Open Wound Instrument: Curette Bleeding: Minimum Hemostasis Achieved: Pressure End Time: 13:30 Procedural  Pain: 0 Post Procedural Pain: 0 Response to Treatment: Procedure was tolerated well Level of Consciousness (Post- Awake and Alert procedure): Post Debridement Measurements of Total Wound Length: (cm) 1.7 Width: (cm) 1.3 Depth: (cm) 0.2 Volume: (cm) 0.347 Character of Wound/Ulcer Post Debridement: Improved Severity of Tissue Post Debridement: Fat layer exposed Post Procedure Diagnosis Same as Pre-procedure Notes Scribed for Dr. Celine Ahr by J.Scotton Electronic Signature(s) Signed: 02/01/2023 4:25:44 PM By: Fredirick Maudlin MD FACS Signed: 02/01/2023 6:21:07 PM By: Dellie Catholic RN Entered By: Dellie Catholic on 02/01/2023 13:43:29 -------------------------------------------------------------------------------- HPI Details Patient Name: Date of Service: Daniel Picking,  DO NA LD 02/01/2023 12:45 PM Medical Record Number: [id number] Patient Account Number: [id number] Date of Birth/Sex: Treating RN: 1961-10-03 (62 y.o. M) Primary Care Provider: Hiram Comber Other Clinician: Referring Provider: Treating Provider/Extender: Billey Co Weeks in Treatment: 2 History of Present Illness HPI Description: ADMISSION [name] ([id number]) [id number] Page 3 of 11 This is a 62 year old poorly controlled type II diabetic (last hemoglobin A1c 8.1% in September 2023, historically much higher). He has been followed by podiatry for a number of foot ulcers over the past year. He has not yet required any amputations. He currently has a right plantar foot ulcer in the forefoot that he states was secondary to stepping on a piece of glass while walking barefoot in his home. This apparently initially extended into the muscle layer. He also has a chronic area on his medial left great toe. X-rays taken in the podiatry clinic on 8 February were interpreted as follows: Date: 12/21/2022 XR bilateral foot weightbearing AP/Lateral/Oblique Findings: Attention  directed on the right foot metatarsal heads of the second through fourth metatarsal there is no obvious erosion or osteolysis to suspect osteomyelitis. No soft tissue emphysema is noted. No radiolucent foreign bodies identified. Left foot there is noted to be significant irregularity at the hallux interphalangeal joint with osseous erosion and valgus angulation at the hallux interphalangeal joint. Unable to determine if the irregularity of the joint is related to arthritic changes infection or possible chronic gout. A 14-day course of doxycycline was prescribed and he was referred to the wound care center. He saw podiatry again on March 4. Formal ABIs were ordered. These are scheduled for March 14. On exam, there is a deep ulcer on his right midfoot. It is quite clean but there is some slough on the surface and callus accumulation around the perimeter. On the medial left great toe, there is a scaly hyperkeratotic lesion that frankly resembles a bizarre wart. On the left second toe, there is a deep pink circular area that does not have any actual skin breakdown but looks susceptible to such. The left great toe is also bright red, hot, and swollen. He has significant neuropathy, however, and it is not painful. 02/01/2023: The hyperkeratotic lesion on the medial left great toe that I removed came back from pathology as consistent with a wart, as I had suspected. The left second toe site has progressed to an ulcer and there is some slough present on the surface. The right midfoot wound is clean with some periwound callus accumulation and minimal biofilm on a bed of good granulation tissue. He has, unfortunately, developed an abscess at the arch of his left foot. The whole foot is hot, red, and swollen. He says it just came up yesterday and he thought it was secondary to wearing improperly fitted shoes. I suspected has been there longer; he has not been seen in clinic for couple of weeks. Electronic  Signature(s) Signed: 02/01/2023 3:06:09 PM By: Fredirick Maudlin MD FACS Entered By: Fredirick Maudlin on 02/01/2023 15:06:09 -------------------------------------------------------------------------------- Incision and Drainage Details Patient Name: Date of Service: [place], DO NA LD 02/01/2023 12:45 PM Medical Record Number: [id number] Patient Account Number: [id number] Date of Birth/Sex: Treating RN: December 24, 1960 (62 y.o. Collene Gobble Primary Care Provider: Hiram Comber Other Clinician: Referring Provider: Treating Provider/Extender: Billey Co Weeks in Treatment: 2 Incision And Drainage Performed Wound #3 Left, Medial Foot for: Performed By: Physician Fredirick Maudlin, MD Incision And Drainage Type: Abscess Location: L Medial Foot Level of Consciousness (Pre-  Awake and Alert procedure): Pre-procedure Verification/Time Out Yes - 13:35 Taken: Pain Control: Lidocaine 4% Topical Solution Drainage Of: Purulent Instrument: Blade Bleeding: Minimum Hemostasis Achieved: Pressure Culture Sent: Swab Procedural Pain: 0 Post Procedural Pain: 0 Response to Treatment: Procedure was tolerated well Level of Consciousness (Post- Awake and Alert procedure): Post Procedure Diagnosis Same as Pre-procedure Notes Scribed for Dr. Celine Ahr by J.Scotton Electronic Signature(s) Signed: 02/01/2023 4:25:44 PM By: Fredirick Maudlin MD FACS Signed: 02/01/2023 6:21:07 PM By: Dellie Catholic RN Entered By: Dellie Catholic on 02/01/2023 13:46:57 Physical Exam Details -------------------------------------------------------------------------------- [name] ([id number] Page 4 of 11 Patient Name: Date of Service: [place], DO NA LD 02/01/2023 12:45 PM Medical Record Number: [id number] Patient Account Number: [id number] Date of Birth/Sex: Treating RN: 1961/01/29 (62 y.o. M) Primary Care Provider: Hiram Comber Other  Clinician: Referring Provider: Treating Provider/Extender: Billey Co Weeks in Treatment: 2 Constitutional Hypertensive, asymptomatic. Tachycardic, asymptomatic. . . no acute distress. Respiratory Normal work of breathing on room air. Notes 02/01/2023: The left second toe site has progressed to an ulcer and there is some slough present on the surface. The right midfoot wound is clean with some periwound callus accumulation and minimal biofilm on a bed of good granulation tissue. He has, unfortunately, developed an abscess at the arch of his left foot. The whole foot is hot, red, and swollen. Electronic Signature(s) Signed: 02/01/2023 3:07:30 PM By: Fredirick Maudlin MD FACS Entered By: Fredirick Maudlin on 02/01/2023 15:07:29 -------------------------------------------------------------------------------- Physician Orders Details Patient Name: Date of Service: [place], DO NA LD 02/01/2023 12:45 PM Medical Record Number: [id number] Patient Account Number: [id number] Date of Birth/Sex: Treating RN: 1961-05-30 (62 y.o. Collene Gobble Primary Care Provider: Hiram Comber Other Clinician: Referring Provider: Treating Provider/Extender: Tonna Boehringer in Treatment: 2 Verbal / Phone Orders: No Diagnosis Coding ICD-10 Coding Code Description L97.513 Non-pressure chronic ulcer of other part of right foot with necrosis of muscle L97.522 Non-pressure chronic ulcer of other part of left foot with fat layer exposed L02.612 Cutaneous abscess of left foot L98.9 Disorder of the skin and subcutaneous tissue, unspecified E11.621 Type 2 diabetes mellitus with foot ulcer I10 Essential (primary) hypertension M10.9 Gout, unspecified Follow-up Appointments ppointment in 1 week. - Dr. Celine Ahr - room 2 Return A Other: - Pt. advised to go to hospital for a surgical debridement Anesthetic (In clinic) Topical Lidocaine 4% applied to wound  bed Bathing/ Shower/ Hygiene May shower and wash wound with soap and water. Off-Loading Other: - stay off feet as much as possible Non Wound Condition Protect area with: - foam doughnuts/callus pads Wound Treatment Wound #1 - Metatarsal head fourth Wound Laterality: Right Cleanser: Soap and Water 1 x Per Day/30 Days Discharge Instructions: May shower and wash wound with dial antibacterial soap and water prior to dressing change. Cleanser: Wound Cleanser 1 x Per Day/30 Days Discharge Instructions: Cleanse the wound with wound cleanser prior to applying a clean dressing using gauze sponges, not tissue or cotton balls. Prim Dressing: Maxorb Extra Ag+ Alginate Dressing, 2x2 (in/in) 1 x Per Day/30 Days ary Discharge Instructions: Apply to wound bed as instructed Honeyman, Elenore Rota ([id number] Page 5 of 11 Secondary Dressing: Optifoam Non-Adhesive Dressing, 4x4 in 1 x Per Day/30 Days Discharge Instructions: Apply over primary dressing as directed. Secondary Dressing: Woven Gauze Sponge, Non-Sterile 4x4 in 1 x Per Day/30 Days Discharge Instructions: Apply over primary dressing as directed. Secured With: 64M Medipore H Soft Cloth Surgical T ape, 4 x 10 (in/yd) 1 x Per Day/30  Days Discharge Instructions: Secure with tape as directed. Wound #2 - T Second oe Wound Laterality: Left Cleanser: Soap and Water 1 x Per Day/30 Days Discharge Instructions: May shower and wash wound with dial antibacterial soap and water prior to dressing change. Cleanser: Wound Cleanser 1 x Per Day/30 Days Discharge Instructions: Cleanse the wound with wound cleanser prior to applying a clean dressing using gauze sponges, not tissue or cotton balls. Prim Dressing: Maxorb Extra Ag+ Alginate Dressing, 2x2 (in/in) 1 x Per Day/30 Days ary Discharge Instructions: Apply to wound bed as instructed Secondary Dressing: Optifoam Non-Adhesive Dressing, 4x4 in 1 x Per Day/30 Days Discharge  Instructions: Apply over primary dressing as directed. Secondary Dressing: Woven Gauze Sponge, Non-Sterile 4x4 in 1 x Per Day/30 Days Discharge Instructions: Apply over primary dressing as directed. Secured With: 27M Medipore H Soft Cloth Surgical T ape, 4 x 10 (in/yd) 1 x Per Day/30 Days Discharge Instructions: Secure with tape as directed. Wound #3 - Foot Wound Laterality: Left, Medial Cleanser: Soap and Water 1 x Per Day/30 Days Discharge Instructions: May shower and wash wound with dial antibacterial soap and water prior to dressing change. Cleanser: Wound Cleanser 1 x Per Day/30 Days Discharge Instructions: Cleanse the wound with wound cleanser prior to applying a clean dressing using gauze sponges, not tissue or cotton balls. Prim Dressing: Maxorb Extra Ag+ Alginate Dressing, 2x2 (in/in) 1 x Per Day/30 Days ary Discharge Instructions: Apply to wound bed as instructed Secondary Dressing: Optifoam Non-Adhesive Dressing, 4x4 in 1 x Per Day/30 Days Discharge Instructions: Apply over primary dressing as directed. Secondary Dressing: Woven Gauze Sponge, Non-Sterile 4x4 in 1 x Per Day/30 Days Discharge Instructions: Apply over primary dressing as directed. Secured With: 27M Medipore H Soft Cloth Surgical T ape, 4 x 10 (in/yd) 1 x Per Day/30 Days Discharge Instructions: Secure with tape as directed. Laboratory naerobe culture (MICRO) - Left Medial Foot - (ICD10 L97.522 - Non-pressure chronic ulcer of Bacteria identified in Unspecified specimen by A other part of left foot with fat layer exposed) LOINC Code: [id number] Convenience Name: Anaerobic culture Electronic Signature(s) Signed: 02/01/2023 6:21:07 PM By: Dellie Catholic RN Signed: 02/02/2023 9:45:17 AM By: Fredirick Maudlin MD FACS Previous Signature: 02/01/2023 4:25:44 PM Version By: Fredirick Maudlin MD FACS Entered By: Dellie Catholic on 02/01/2023 18:06:49 Prescription  02/01/2023 -------------------------------------------------------------------------------- Salli Quarry MD Patient Name: Provider: May 25, 1961 [id number] Date of Birth: NPI#Jerilynn Mages [id number] Sex: DEA #: [phone] [id number] Phone #: License #: [place] Patient Address: [place], [place] ([id number]) [id number] Page 6 of 11 [place], [place] [address] [address], [place] [address] [phone] Allergies No Known Allergies Provider's Orders naerobe culture - ICD10: L97.522 - Left Medial Foot Bacteria identified in Unspecified specimen by A LOINC Code: [id number] Convenience Name: Anaerobic culture Hand Signature: Date(s): Electronic Signature(s) Signed: 02/01/2023 6:21:07 PM By: Dellie Catholic RN Signed: 02/02/2023 9:45:17 AM By: Fredirick Maudlin MD FACS Previous Signature: 02/01/2023 4:25:44 PM Version By: Fredirick Maudlin MD FACS Entered By: Dellie Catholic on 02/01/2023 18:06:50 -------------------------------------------------------------------------------- Problem List Details Patient Name: Date of Service: Daniel Picking, DO NA LD 02/01/2023 12:45 PM Medical Record Number: [id number] Patient Account Number: [id number] Date of Birth/Sex: Treating RN: 22-Apr-1961 (62 y.o. M) Primary Care Provider: Hiram Comber Other Clinician: Referring Provider: Treating Provider/Extender: Billey Co Weeks in Treatment: 2 Active Problems ICD-10 Encounter Code Description Active Date MDM Diagnosis L97.513 Non-pressure chronic ulcer of other part of right  foot with necrosis of muscle 01/17/2023 No Yes L97.522 Non-pressure chronic ulcer of other part of left foot with fat layer exposed 02/01/2023 No Yes L02.612 Cutaneous abscess of left foot 02/01/2023 No Yes L98.9 Disorder of the skin and subcutaneous tissue, unspecified 01/17/2023 No Yes E11.621 Type 2  diabetes mellitus with foot ulcer 01/17/2023 No Yes I10 Essential (primary) hypertension 01/17/2023 No Yes M10.9 Gout, unspecified 01/17/2023 No Yes Inactive Problems ICD-10 Code Description Active Date Inactive Date Sangiovanni, Elenore Rota ([id number]) [id number] Page 7 of 11 L89.896 Pressure-induced deep tissue damage of other site 01/17/2023 01/17/2023 Resolved Problems Electronic Signature(s) Signed: 02/01/2023 2:54:13 PM By: Fredirick Maudlin MD FACS Entered By: Fredirick Maudlin on 02/01/2023 14:54:12 -------------------------------------------------------------------------------- Progress Note Details Patient Name: Date of Service: Daniel Picking, DO NA LD 02/01/2023 12:45 PM Medical Record Number: [id number] Patient Account Number: [id number] Date of Birth/Sex: Treating RN: September 08, 1961 (62 y.o. M) Primary Care Provider: Hiram Comber Other Clinician: Referring Provider: Treating Provider/Extender: Billey Co Weeks in Treatment: 2 Subjective Chief Complaint Information obtained from Patient Patients presents for treatment of an open diabetic ulcer History of Present Illness (HPI) ADMISSION This is a 62 year old poorly controlled type II diabetic (last hemoglobin A1c 8.1% in September 2023, historically much higher). He has been followed by podiatry for a number of foot ulcers over the past year. He has not yet required any amputations. He currently has a right plantar foot ulcer in the forefoot that he states was secondary to stepping on a piece of glass while walking barefoot in his home. This apparently initially extended into the muscle layer. He also has a chronic area on his medial left great toe. X-rays taken in the podiatry clinic on 8 February were interpreted as follows: Date: 12/21/2022 XR bilateral foot weightbearing AP/Lateral/Oblique Findings: Attention directed on the right foot metatarsal heads of the second through fourth metatarsal  there is no obvious erosion or osteolysis to suspect osteomyelitis. No soft tissue emphysema is noted. No radiolucent foreign bodies identified. Left foot there is noted to be significant irregularity at the hallux interphalangeal joint with osseous erosion and valgus angulation at the hallux interphalangeal joint. Unable to determine if the irregularity of the joint is related to arthritic changes infection or possible chronic gout. A 14-day course of doxycycline was prescribed and he was referred to the wound care center. He saw podiatry again on March 4. Formal ABIs were ordered. These are scheduled for March 14. On exam, there is a deep ulcer on his right midfoot. It is quite clean but there is some slough on the surface and callus accumulation around the perimeter. On the medial left great toe, there is a scaly hyperkeratotic lesion that frankly resembles a bizarre wart. On the left second toe, there is a deep pink circular area that does not have any actual skin breakdown but looks susceptible to such. The left great toe is also bright red, hot, and swollen. He has significant neuropathy, however, and it is not painful. 02/01/2023: The hyperkeratotic lesion on the medial left great toe that I removed came back from pathology as consistent with a wart, as I had suspected. The left second toe site has progressed to an ulcer and there is some slough present on the surface. The right midfoot wound is clean with some periwound callus accumulation and minimal biofilm on a bed of good granulation tissue. He has, unfortunately, developed an abscess at the arch of his left foot. The whole foot is hot, red, and swollen. He says it  just came up yesterday and he thought it was secondary to wearing improperly fitted shoes. I suspected has been there longer; he has not been seen in clinic for couple of weeks. Patient History Information obtained from Patient. Family History Cancer - Mother, Diabetes -  Mother, Heart Disease - Father, Hypertension - Mother, No family history of Kidney Disease, Lung Disease, Seizures, Stroke, Thyroid Problems, Tuberculosis. Social History Former smoker, Marital Status - Single, Alcohol Use - Never, Drug Use - No History, Caffeine Use - Daily. Medical History Cardiovascular Patient has history of Hypertension Endocrine Patient has history of Type II Diabetes Musculoskeletal Patient has history of Osteoarthritis Neurologic Patient has history of Neuropathy Hospitalization/Surgery History - varicose vein surgery. Medical A Surgical History Notes nd Gastrointestinal GERD Psychiatric Suchan, Elenore Rota ([id number]) [id number] Page 8 of 11 anxiety, depression Objective Constitutional Hypertensive, asymptomatic. Tachycardic, asymptomatic. no acute distress. Vitals Time Taken: 12:53 PM, Height: 72 in, Weight: 220 lbs, BMI: 29.8, Temperature: 98.4 F, Pulse: 120 bpm, Respiratory Rate: 20 breaths/min, Blood Pressure: 190/100 mmHg, Capillary Blood Glucose: 200 mg/dl. Respiratory Normal work of breathing on room air. General Notes: 02/01/2023: The left second toe site has progressed to an ulcer and there is some slough present on the surface. The right midfoot wound is clean with some periwound callus accumulation and minimal biofilm on a bed of good granulation tissue. He has, unfortunately, developed an abscess at the arch of his left foot. The whole foot is hot, red, and swollen. Integumentary (Hair, Skin) Wound #1 status is Open. Original cause of wound was Trauma. The date acquired was: 11/13/2022. The wound has been in treatment 2 weeks. The wound is located on the Right Metatarsal head fourth. The wound measures 1.4cm length x 0.9cm width x 0.7cm depth; 0.99cm^2 area and 0.693cm^3 volume. There is Fat Layer (Subcutaneous Tissue) exposed. There is no tunneling noted, however, there is undermining starting at 12:00 and ending at 12:00  with a maximum distance of 0.9cm. There is a medium amount of serosanguineous drainage noted. The wound margin is distinct with the outline attached to the wound base. There is large (67-100%) red, pink granulation within the wound bed. There is a small (1-33%) amount of necrotic tissue within the wound bed including Adherent Slough. The periwound skin appearance had no abnormalities noted for moisture. The periwound skin appearance had no abnormalities noted for color. The periwound skin appearance exhibited: Callus. Periwound temperature was noted as No Abnormality. Wound #2 status is Open. Original cause of wound was Gradually Appeared. The date acquired was: 01/22/2023. The wound is located on the Left T Second. oe The wound measures 1.7cm length x 1.3cm width x 0.2cm depth; 1.736cm^2 area and 0.347cm^3 volume. There is Fat Layer (Subcutaneous Tissue) exposed. There is no tunneling or undermining noted. There is a medium amount of serosanguineous drainage noted. There is small (1-33%) red, pink granulation within the wound bed. There is a large (67-100%) amount of necrotic tissue within the wound bed including Eschar and Adherent Slough. The periwound skin appearance had no abnormalities noted for moisture. The periwound skin appearance had no abnormalities noted for color. The periwound skin appearance exhibited: Callus. Periwound temperature was noted as No Abnormality. Wound #3 status is Open. Original cause of wound was Trauma. The date acquired was: 01/30/2023. The wound is located on the Left,Medial Foot. The wound measures 2.7cm length x 2.5cm width x 4.2cm depth; 5.301cm^2 area and 22.266cm^3 volume. There is Fat Layer (Subcutaneous Tissue) exposed. There is no tunneling or undermining  noted. There is a medium amount of serosanguineous drainage noted. There is small (1-33%) red granulation within the wound bed. There is a large (67-100%) amount of necrotic tissue within the wound bed  including Adherent Slough. The periwound skin appearance had no abnormalities noted for texture. The periwound skin appearance had no abnormalities noted for moisture. The periwound skin appearance had no abnormalities noted for color. Periwound temperature was noted as No Abnormality. 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 There was a Selective/Open Wound Non-Viable Tissue Debridement with a total area of 1.26 sq cm performed by Fredirick Maudlin, MD. With the following instrument(s): Curette to remove Non-Viable tissue/material. Material removed includes Callus and Biofilm and after achieving pain control using Lidocaine 4% T opical Solution. 1 specimen was taken by a Tissue Culture and sent to the lab per facility protocol. A time out was conducted at 13:29, prior to the start of the procedure. A Minimum amount of bleeding was controlled with Pressure. The procedure was tolerated well with a pain level of 0 throughout and a pain level of 0 following the procedure. Post Debridement Measurements: 1.4cm length x 0.9cm width x 0.7cm depth; 0.693cm^3 volume. Character of Wound/Ulcer Post Debridement is improved. Severity of Tissue Post Debridement is: Fat layer exposed. Post procedure Diagnosis Wound #1: Same as Pre-Procedure General Notes: Scribed for Dr. Celine Ahr by J.Scotton. Wound #2 Pre-procedure diagnosis of Wound #2 is a Diabetic Wound/Ulcer of the Lower  Extremity located on the Left T Second .Severity of Tissue Pre Debridement oe is: Fat layer exposed. There was a Selective/Open Wound Non-Viable Tissue Debridement with a total area of 2.21 sq cm performed by Fredirick Maudlin, MD. [name] ([id number]) [id number] Page 9 of 11 With the following instrument(s): Curette to remove Non-Viable tissue/material. Material removed includes Callus and Slough and after achieving pain control using Lidocaine 4% T opical Solution. No specimens were taken. A time out was conducted at 13:29, prior to the start of the procedure. A Minimum amount of bleeding was controlled with Pressure. The procedure was tolerated well with a pain level of 0 throughout and a pain level of 0 following the procedure. Post Debridement Measurements: 1.7cm length x 1.3cm width x 0.2cm depth; 0.347cm^3 volume. Character of Wound/Ulcer Post Debridement is improved. Severity of Tissue Post Debridement is: Fat layer exposed. Post procedure Diagnosis Wound #2: Same as Pre-Procedure General Notes: Scribed for Dr. Celine Ahr by J.Scotton. Wound #3 Pre-procedure diagnosis of Wound #3 is an Abscess located on the Left, Medial Foot . Abscess incision and drainage was provided by Fredirick Maudlin, MD. The skin was cleansed and prepped with anti-septic followed by pain control using Lidocaine 4% Topical Solution. An incision was made in the L Medial Foot with the following instrument(s): Blade. There was an immediate release of Purulent fluid. A Minimum amount of bleeding was controlled with Pressure. A time out was conducted at 13:35, prior to the start of the procedure. Swab culture was sent. The procedure was tolerated well with a pain level of 0 throughout and a  pain level of 0 following the procedure. Post procedure Diagnosis Wound #3: Same as Pre-Procedure General Notes: Scribed for Dr. Celine Ahr by J.Scotton. Plan Follow-up Appointments: Return Appointment in 1  week. - Dr. Celine Ahr - room 2 Other: - Pt. advised to go to hospital for a surgical debridement Anesthetic: (In clinic) Topical Lidocaine 4% applied to wound bed Bathing/ Shower/ Hygiene: May shower and wash wound with soap and water. Off-Loading: Other: - stay off feet as much as possible Non Wound Condition: Protect area with: - foam doughnuts/callus pads Laboratory ordered were: Anaerobic culture - Left Medial Foot WOUND #1: - Metatarsal head fourth Wound Laterality: Right Cleanser: Soap and Water 1 x Per Day/30 Days Discharge Instructions: May shower and wash wound with dial antibacterial soap and water prior to dressing change. Cleanser: Wound Cleanser 1 x Per Day/30 Days Discharge Instructions: Cleanse the wound with wound cleanser prior to applying a clean dressing using gauze sponges, not tissue or cotton balls. Prim Dressing: Maxorb Extra Ag+ Alginate Dressing, 2x2 (in/in) 1 x Per Day/30 Days ary Discharge Instructions: Apply to wound bed as instructed Secondary Dressing: Optifoam Non-Adhesive Dressing, 4x4 in 1 x Per Day/30 Days Discharge Instructions: Apply over primary dressing as directed. Secondary Dressing: Woven Gauze Sponge, Non-Sterile 4x4 in 1 x Per Day/30 Days Discharge Instructions: Apply over primary dressing as directed. Secured With: 61M Medipore H Soft Cloth Surgical T ape, 4 x 10 (in/yd) 1 x Per Day/30 Days Discharge Instructions: Secure with tape as directed. WOUND #2: - T Second Wound Laterality: Left oe Cleanser: Soap and Water 1 x Per Day/30 Days Discharge Instructions: May shower and wash wound with dial antibacterial soap and water prior to dressing change. Cleanser: Wound Cleanser 1 x Per Day/30 Days Discharge Instructions: Cleanse the wound with wound cleanser prior to applying a clean dressing using gauze sponges, not tissue or cotton balls. Prim Dressing: Maxorb Extra Ag+ Alginate Dressing, 2x2 (in/in) 1 x Per Day/30 Days ary Discharge Instructions:  Apply to wound bed as instructed Secondary Dressing: Optifoam Non-Adhesive Dressing, 4x4 in 1 x Per Day/30 Days Discharge Instructions: Apply over primary dressing as directed. Secondary Dressing: Woven Gauze Sponge, Non-Sterile 4x4 in 1 x Per Day/30 Days Discharge Instructions: Apply over primary dressing as directed. Secured With: 61M Medipore H Soft Cloth Surgical T ape, 4 x 10 (in/yd) 1 x Per Day/30 Days Discharge Instructions: Secure with tape as directed. WOUND #3: - Foot Wound Laterality: Left, Medial Cleanser: Soap and Water 1 x Per Day/30 Days Discharge Instructions: May shower and wash wound with dial antibacterial soap and water prior to dressing change. Cleanser: Wound Cleanser 1 x Per Day/30 Days Discharge Instructions: Cleanse the wound with wound cleanser prior to applying a clean dressing using gauze sponges, not tissue or cotton balls. Prim Dressing: Maxorb Extra Ag+ Alginate Dressing, 2x2 (in/in) 1 x Per Day/30 Days ary Discharge Instructions: Apply to wound bed as instructed Secondary Dressing: Optifoam Non-Adhesive Dressing, 4x4 in 1 x Per Day/30 Days Discharge Instructions: Apply over primary dressing as directed. Secondary Dressing: Woven Gauze Sponge, Non-Sterile 4x4 in 1 x Per Day/30 Days Discharge Instructions: Apply over primary dressing as directed. Secured With: 61M Medipore H Soft Cloth Surgical T ape, 4 x 10 (in/yd) 1 x Per Day/30 Days Discharge Instructions: Secure with tape as directed. 02/01/2023: The left second toe site has progressed to an ulcer and there is some slough present on the surface. The right midfoot wound is clean with some periwound callus accumulation and minimal biofilm  on a bed of good granulation tissue. He has, unfortunately, developed an abscess at the arch of his left foot. The whole foot is hot, red, and swollen. I used a curette to debride slough and callus from the left second toe site. I debrided callus and biofilm from the right  midfoot wound. I then performed an incision and drainage of the abscess at the arch of his left foot. I took a culture of the purulent material that drained. I then probed the site of the abscess and found that it went all the way to his midfoot. Based upon this finding, along with the appearance of his foot, I recommended that he present to the emergency department to be admitted for IV antibiotics and likely surgical debridement. He said that he needed to make some arrangements for his cats but that he would do so afterward. We will schedule him for follow-up in a week, but that may vary depending on what happens while he is in-house. [name] ([id number]) [id number] Page 10 of 11 Electronic Signature(s) Signed: 02/05/2023 4:08:45 PM By: Fredirick Maudlin MD FACS Signed: 02/05/2023 4:40:03 PM By: Deon Pilling RN, BSN Previous Signature: 02/01/2023 3:48:04 PM Version By: Fredirick Maudlin MD FACS Previous Signature: 02/01/2023 3:09:46 PM Version By: Fredirick Maudlin MD FACS Entered By: Deon Pilling on 02/05/2023 15:02:13 -------------------------------------------------------------------------------- HxROS Details Patient Name: Date of Service: Burtt, DO NA LD 02/01/2023 12:45 PM Medical Record Number: [id number] Patient Account Number: [id number] Date of Birth/Sex: Treating RN: 25-Oct-1961 (62 y.o. M) Primary Care Provider: Hiram Comber Other Clinician: Referring Provider: Treating Provider/Extender: Billey Co Weeks in Treatment: 2 Information Obtained From Patient Cardiovascular Medical History: Positive for: Hypertension Gastrointestinal Medical History: Past Medical History Notes: GERD Endocrine Medical History: Positive for: Type II Diabetes Time with diabetes: 10 yrs Treated with: Oral agents Blood sugar tested every day: Yes Tested : x3 Musculoskeletal Medical History: Positive for:  Osteoarthritis Neurologic Medical History: Positive for: Neuropathy Psychiatric Medical History: Past Medical History Notes: anxiety, depression Immunizations Pneumococcal Vaccine: Received Pneumococcal Vaccination: No Implantable Devices None Hospitalization / Surgery History Type of Hospitalization/Surgery varicose vein surgery Family and Social History Cancer: Yes - Mother; Diabetes: Yes - Mother; Heart Disease: Yes - Father; Hypertension: Yes - Mother; Kidney Disease: No; Lung Disease: No; Seizures: No; Stroke: No; Thyroid Problems: No; Tuberculosis: No; Former smoker; Marital Status - Single; Alcohol Use: Never; Drug Use: No History; Caffeine Use: Daily; Financial Concerns: No; Food, Clothing or Shelter Needs: No; Support System Lacking: No; Transportation Concerns: No Hippe, Elenore Rota ([id number]) [id number] Page 11 of 11 Electronic Signature(s) Signed: 02/01/2023 4:25:44 PM By: Fredirick Maudlin MD FACS Entered By: Fredirick Maudlin on 02/01/2023 15:06:16 -------------------------------------------------------------------------------- SuperBill Details Patient Name: Date of Service: Daniel Picking, DO NA LD 02/01/2023 Medical Record Number: [id number] Patient Account Number: [id number] Date of Birth/Sex: Treating RN: Mar 20, 1961 (62 y.o. M) Primary Care Provider: Hiram Comber Other Clinician: Referring Provider: Treating Provider/Extender: Billey Co Weeks in Treatment: 2 Diagnosis Coding ICD-10 Codes Code Description [phone] Non-pressure chronic ulcer of other part of right foot with necrosis of muscle L97.522 Non-pressure chronic ulcer of other part of left foot with fat layer exposed L02.612 Cutaneous abscess of left foot L98.9 Disorder of the skin and subcutaneous tissue, unspecified E11.621 Type 2 diabetes mellitus with foot ulcer I10 Essential (primary) hypertension M10.9 Gout, unspecified Facility  Procedures : CPT4 Code: [id number] Description: [id number] - DEBRIDE WOUND 1ST 20 SQ CM OR < ICD-10 Diagnosis Description L97.513 Non-pressure chronic ulcer of other part of  right foot with necrosis of muscle L97.522 Non-pressure chronic ulcer of other part of left foot with fat layer exposed Modifier: Quantity: 1 : CPT4 Code: [id number] Description: [id number] - I and D Abscess; simple/single ICD-10 Diagnosis Description L02.612 Cutaneous abscess of left foot Modifier: Quantity: 1 Physician Procedures : CPT4 Code Description Modifier [id number] - WC PHYS LEVEL 4 - EST PT 25 ICD-10 Diagnosis Description L97.513 Non-pressure chronic ulcer of other part of right foot with necrosis of muscle L97.522 Non-pressure chronic ulcer of other part of left foot  with fat layer exposed L02.612 Cutaneous abscess of left foot E11.621 Type 2 diabetes mellitus with foot ulcer Quantity: 1 : [id number] - WC PHYS DEBR WO ANESTH 20 SQ CM ICD-10 Diagnosis Description L97.513 Non-pressure chronic ulcer of other part of right foot with necrosis of muscle L97.522 Non-pressure chronic ulcer of other part of left foot with fat layer exposed Quantity: 1 : [id number] - I and D Abscess; simple/single ICD-10 Diagnosis Description L02.612 Cutaneous abscess of left foot Quantity: 1 Electronic Signature(s) Signed: 02/01/2023 3:48:48 PM By: Fredirick Maudlin MD FACS Entered By: Fredirick Maudlin on 02/01/2023 15:48:48

## 2023-02-03 NOTE — Anesthesia Procedure Notes (Signed)
Procedure Name: Intubation Date/Time: 02/03/2023 10:31 AM  Performed by: Claudia Desanctis, CRNAPre-anesthesia Checklist: Patient identified, Emergency Drugs available, Suction available and Patient being monitored Patient Re-evaluated:Patient Re-evaluated prior to induction Oxygen Delivery Method: Circle system utilized Preoxygenation: Pre-oxygenation with 100% oxygen Induction Type: IV induction, Cricoid Pressure applied and Rapid sequence Laryngoscope Size: 2 and Miller Grade View: Grade I Tube type: Oral Number of attempts: 1 Airway Equipment and Method: Stylet Placement Confirmation: ETT inserted through vocal cords under direct vision, positive ETCO2 and breath sounds checked- equal and bilateral Tube secured with: Tape Dental Injury: Teeth and Oropharynx as per pre-operative assessment

## 2023-02-03 NOTE — Anesthesia Postprocedure Evaluation (Signed)
Anesthesia Post Note  Patient: [name]  Procedure(s) Performed: IRRIGATION AND DEBRIDEMENT LEFT FOOT AND BONE BIOPSY (Left: Foot)     Patient location during evaluation: PACU Anesthesia Type: General Level of consciousness: awake and alert, oriented and patient cooperative Pain management: pain level controlled Vital Signs Assessment: post-procedure vital signs reviewed and stable Respiratory status: spontaneous breathing, nonlabored ventilation and respiratory function stable Cardiovascular status: blood pressure returned to baseline and stable Postop Assessment: no apparent nausea or vomiting Anesthetic complications: no   No notable events documented.  Last Vitals:  Vitals:   02/03/23 1130 02/03/23 1145  BP: 112/65 107/68  Pulse: 96 94  Resp: 19 (!) 21  Temp: (!) 36.4 C   SpO2: 98% 97%    Last Pain:  Vitals:   02/03/23 1145  TempSrc:   PainSc: 0-No pain                 Pervis Hocking

## 2023-02-03 NOTE — Transfer of Care (Signed)
Immediate Anesthesia Transfer of Care Note  Patient: [name]  Procedure(s) Performed: IRRIGATION AND DEBRIDEMENT LEFT FOOT AND BONE BIOPSY (Left: Foot)  Patient Location: PACU  Anesthesia Type:General  Level of Consciousness: awake and patient cooperative  Airway & Oxygen Therapy: Patient Spontanous Breathing and Patient connected to face mask  Post-op Assessment: Report given to RN and Post -op Vital signs reviewed and stable  Post vital signs: Reviewed and stable  Last Vitals:  Vitals Value Taken Time  BP 112/65 02/03/23 1132  Temp    Pulse 98 02/03/23 1133  Resp 19 02/03/23 1133  SpO2 93 % 02/03/23 1133  Vitals shown include unvalidated device data.  Last Pain:  Vitals:   02/03/23 0804  TempSrc: Oral  PainSc:          Complications: No notable events documented.

## 2023-02-03 NOTE — Progress Notes (Signed)
[facility]                                                                              [name], is a 62 y.o. male, DOB - January 04, 1961, [id number] Admit date - 02/02/2023    Outpatient Primary MD for the patient is Patient, [name]  LOS - 1  days  Chief Complaint  Patient presents with   Wound Infection    L foot       Brief summary   Patient is a 62 year old male with diabetes mellitus type 2, IDDM, PAD, diabetic foot ulcers, HLP, anxiety follows with wound care and podiatry presented with left foot ulcer.  Patient reported that 2 to 3 days ago, noted a rash on his LLE, left foot, it started to hurt and swell and then burst open a day before the admission.  He had difficulty ambulating due to significant pain.  Patient had been on doxycycline for 14 days, started on 3/4 by Dr Blenda Mounts (podiatry).  In ED, temp 98.4, heart rate 98-144, BP 171/117, O2 sats 95 to 98% on RA  Labs showed Na 131, BUN 19, cr 1.26.  Lactic acid 1.7.  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[place]), abscess, septic arthritis Right foot ulcer, plantar aspect - d/w Dr Jacqualyn Posey, plan for I&D today, may need washout or amputation of the big toe however patient does not wish to have amputation -Continue n.p.o., IV fluids, pain control -Continue IV vancomycin, Zosyn   Active Problems:   Hyperlipidemia -Continue Lipitor    Generalized anxiety disorder -Continue Xanax as needed    Type 2 diabetes mellitus with complication, with long-term current use of insulin ([place]), uncontrolled with hyperglycemia -Obtain hemoglobin A1c, last hemoglobin A1c in system 11.8 on 04/22/2020 -Continue moderate sliding scale insulin every 4 hours, Semglee 15 units at  bedtime  GERD -Continue PPI  Hypertension -On admission, BP elevated likely due to pain and anxiety -Now improved, continue lisinopril 2.5 mg daily  Mild AKI -Creatinine 1.26, continue gentle hydration.  No recent baseline creatinine  Estimated body mass index is 29.84 kg/m as calculated from the following:   Height as of this encounter: 6' (1.829 m).   Weight as of this encounter: 99.8 kg.  Code Status: Full code DVT Prophylaxis:  enoxaparin (LOVENOX) injection 40 mg Start: 02/03/23 1700 SCD's Start: 02/03/23 0941   Level of Care: Level of care: Telemetry Family Communication: Updated patient Disposition Plan:      Remains inpatient appropriate: OR today   Procedures:    Consultants:   Podiatry  Antimicrobials:   Anti-infectives (From admission, onward)    Start     Dose/Rate Route Frequency Ordered Stop   02/03/23 1800  vancomycin (VANCOREADY) IVPB 2000 mg/400 mL        2,000 mg 200 mL/hr over 120 Minutes Intravenous Every 24 hours 02/02/23 2231     02/03/23 0400  piperacillin-tazobactam (ZOSYN)  IVPB 3.375 g        3.375 g 12.5 mL/hr over 240 Minutes Intravenous Every 8 hours 02/02/23 2231     02/02/23 2030  piperacillin-tazobactam (ZOSYN) IVPB 3.375 g        3.375 g 100 mL/hr over 30 Minutes Intravenous  Once 02/02/23 2020 02/02/23 2224   02/02/23 2030  vancomycin (VANCOCIN) IVPB 1000 mg/200 mL premix        1,000 mg 200 mL/hr over 60 Minutes Intravenous  Once 02/02/23 2020 02/02/23 2253          Medications  atorvastatin  20 mg Oral Daily   Chlorhexidine Gluconate Cloth  6 each Topical Once   And   Chlorhexidine Gluconate Cloth  6 each Topical Once   colchicine  0.6 mg Oral Daily   enoxaparin (LOVENOX) injection  40 mg Subcutaneous Q24H   insulin aspart  0-15 Units Subcutaneous Q4H   insulin glargine-yfgn  15 Units Subcutaneous QHS   lisinopril  2.5 mg Oral Daily   pantoprazole  40 mg Oral Daily      Subjective:   [name] was seen and  examined today.  Complaining of left foot, great toe pain.  Patient denies dizziness, chest pain, shortness of breath, abdominal pain, N/V.  No fevers.  Objective:   Vitals:   02/02/23 2312 02/03/23 0003 02/03/23 0436 02/03/23 0804  BP:  (!) 153/92 126/68 135/75  Pulse:  98 85 93  Resp:  18 19 16   Temp: 98.4 F (36.9 C) 97.9 F (36.6 C) 97.9 F (36.6 C) 98.4 F (36.9 C)  TempSrc:  Oral  Oral  SpO2:  98% 100% 97%  Weight:      Height:        Intake/Output Summary (Last 24 hours) at 02/03/2023 1001 Last data filed at 02/03/2023 0600 Gross per 24 hour  Intake 1300 ml  Output 950 ml  Net 350 ml     Wt Readings from Last 3 Encounters:  02/02/23 99.8 kg  02/14/22 99.5 kg  05/25/20 99.5 kg     Exam General: Alert and oriented x 3, NAD Cardiovascular: S1 S2 auscultated,  RRR Respiratory: Clear to auscultation bilaterally, no wheezing Gastrointestinal: Soft, nontender, nondistended, + bowel sounds Ext: no pedal edema bilaterally Neuro: no new deficits Skin: Significant swelling of the great toe left foot with erythema, edema, abscess along the arch of the foot. ulcer on the plantar aspect of right foot Psych: Normal affect     Data Reviewed:  I have personally reviewed following labs    CBC Lab Results  Component Value Date   WBC 14.6 (H) 02/03/2023   RBC 4.55 02/03/2023   HGB 12.5 (L) 02/03/2023   HCT 39.1 02/03/2023   MCV 85.9 02/03/2023   MCH 27.5 02/03/2023   PLT 344 02/03/2023   MCHC 32.0 02/03/2023   RDW 13.4 02/03/2023   LYMPHSABS 2.8 02/03/2023   MONOABS 0.8 02/03/2023   EOSABS 0.5 02/03/2023   BASOSABS 0.1 99991111     Last metabolic panel Lab Results  Component Value Date   NA 135 02/03/2023   K 3.5 02/03/2023   CL 99 02/03/2023   CO2 24 02/03/2023   BUN 16 02/03/2023   CREATININE 1.03 02/03/2023   GLUCOSE 141 (H) 02/03/2023   GFRNONAA >60 02/03/2023   GFRAA 108 04/22/2020   CALCIUM 9.0 02/03/2023   PROT 8.6 (H) 02/02/2023   ALBUMIN  3.6 02/02/2023   LABGLOB 2.9 04/22/2020   AGRATIO 1.5 04/22/2020  BILITOT 0.7 02/02/2023   ALKPHOS 91 02/02/2023   AST 18 02/02/2023   ALT 18 02/02/2023   ANIONGAP 12 02/03/2023    CBG (last 3)  Recent Labs    02/03/23 0049 02/03/23 0803  GLUCAP 130* 117*      Coagulation Profile: Recent Labs  Lab 02/03/23 0044  INR 1.2     Radiology Studies: I have personally reviewed the imaging studies  MR FOOT LEFT W WO CONTRAST  Result Date: 02/02/2023 CLINICAL DATA:  Soft tissue infection suspected. EXAM: MRI OF THE LEFT FOREFOOT WITHOUT AND WITH CONTRAST TECHNIQUE: Multiplanar, multisequence MR imaging of the forefoot was performed both before and after administration of intravenous contrast. CONTRAST:  10mL GADAVIST GADOBUTROL 1 MMOL/ML IV SOLN COMPARISON:  Radiographs dated February 02, 2023. FINDINGS: Bones/Joint/Cartilage There is marrow edema of the base of the proximal phalanx of the first digit with joint effusion. The interphalangeal joint of the first digit and distal phalanx are not included on this examination, which demonstrate advanced osseous erosion on the radiograph performed earlier on the same date, highly suspicious for septic arthritis. Ligaments Lisfranc ligament is intact. There is thickening of the medial collateral ligament at the first metatarsophalangeal joint. Remaining collateral ligaments appear intact. Muscles and Tendons There is a loculated peripherally enhancing fluid collection about the plantar aspect of the base of the first metatarsal wound about the medial aspect of the foot (series 10, image 18) consistent with abscess. This abscess measures at least 1.5 x 3.0 x 3.0 cm. There is markedly increased signal of the plantar muscles, likely reactive secondary to ongoing infectious/inflammatory process. Generalized soft tissue edema about the foot. Soft tissues Generalized soft tissue swelling about the medial and dorsal aspect of the foot. IMPRESSION: 1. Loculated  peripherally enhancing fluid collection about the plantar aspect of the base of the first metatarsal consistent with abscess measuring at least 1.5 x 3.0 x 3.0 cm. 2. Marrow edema of the base of the proximal phalanx of the first digit with joint effusion consistent with osteomyelitis with septic arthritis. 3. The interphalangeal joint of the first digit and distal phalanx are not included on this examination, which demonstrate advanced osseous erosion on the radiograph performed earlier on the same date, highly suspicious for osteomyelitis/septic arthritis. 4. Markedly increased signal of the plantar muscles, likely reactive secondary to ongoing infectious/inflammatory process. Electronically Signed   By: Keane Police D.O.   On: 02/02/2023 23:03   DG Foot Complete Left  Result Date: 02/02/2023 CLINICAL DATA:  Left foot infection EXAM: LEFT FOOT - COMPLETE 3+ VIEW COMPARISON:  12/21/2022 FINDINGS: Progressive destruction of the proximal and distal phalanx of the left great toe centered at the interphalangeal joint. Prominent soft tissue swelling. No additional sites of bony erosion. No acute fracture or dislocation. There is bandaging material overlying the forefoot. IMPRESSION: Progressive destruction of the proximal and distal phalanx of the left great toe centered at the interphalangeal joint, compatible with osteomyelitis and septic arthritis. Electronically Signed   By: Davina Poke D.O.   On: 02/02/2023 20:05       Estill Cotta M.D. [facility] 02/03/2023, 10:01 AM  Available via Epic secure chat 7am-7pm After 7 pm, please refer to night coverage provider listed on amion.

## 2023-02-04 ENCOUNTER — Encounter (HOSPITAL_COMMUNITY): Payer: Self-pay | Admitting: Podiatry

## 2023-02-04 DIAGNOSIS — L02612 Cutaneous abscess of left foot: Secondary | ICD-10-CM | POA: Diagnosis not present

## 2023-02-04 DIAGNOSIS — F411 Generalized anxiety disorder: Secondary | ICD-10-CM | POA: Diagnosis not present

## 2023-02-04 DIAGNOSIS — E7849 Other hyperlipidemia: Secondary | ICD-10-CM | POA: Diagnosis not present

## 2023-02-04 DIAGNOSIS — M869 Osteomyelitis, unspecified: Secondary | ICD-10-CM | POA: Diagnosis not present

## 2023-02-04 LAB — CBC
HCT: 36.4 % — ABNORMAL LOW (ref 39.0–52.0)
Hemoglobin: 11.5 g/dL — ABNORMAL LOW (ref 13.0–17.0)
MCH: 27.4 pg (ref 26.0–34.0)
MCHC: 31.6 g/dL (ref 30.0–36.0)
MCV: 86.9 fL (ref 80.0–100.0)
Platelets: 396 10*3/uL (ref 150–400)
RBC: 4.19 MIL/uL — ABNORMAL LOW (ref 4.22–5.81)
RDW: 13.7 % (ref 11.5–15.5)
WBC: 13.8 10*3/uL — ABNORMAL HIGH (ref 4.0–10.5)
nRBC: 0 % (ref 0.0–0.2)

## 2023-02-04 LAB — GLUCOSE, CAPILLARY
Glucose-Capillary: 122 mg/dL — ABNORMAL HIGH (ref 70–99)
Glucose-Capillary: 166 mg/dL — ABNORMAL HIGH (ref 70–99)
Glucose-Capillary: 170 mg/dL — ABNORMAL HIGH (ref 70–99)
Glucose-Capillary: 146 mg/dL — ABNORMAL HIGH (ref 70–99)
Glucose-Capillary: 134 mg/dL — ABNORMAL HIGH (ref 70–99)
Glucose-Capillary: 193 mg/dL — ABNORMAL HIGH (ref 70–99)

## 2023-02-04 LAB — BASIC METABOLIC PANEL
Anion gap: 9 (ref 5–15)
BUN: 18 mg/dL (ref 8–23)
CO2: 26 mmol/L (ref 22–32)
Calcium: 8.8 mg/dL — ABNORMAL LOW (ref 8.9–10.3)
Chloride: 101 mmol/L (ref 98–111)
Creatinine, Ser: 1.07 mg/dL (ref 0.61–1.24)
GFR, Estimated: >60 mL/min (ref >60)
Glucose, Bld: 130 mg/dL — ABNORMAL HIGH (ref 70–99)
Potassium: 3.8 mmol/L (ref 3.5–5.1)
Sodium: 136 mmol/L (ref 135–145)

## 2023-02-04 MED ORDER — SODIUM CHLORIDE 0.9 % IV SOLN
2.0000 g | Freq: Three times a day (TID) | INTRAVENOUS | Status: DC
  Administered 2023-02-04 – 2023-02-05 (×3): 2 g via INTRAVENOUS
  Filled 2023-02-04 (×3): qty 12.5

## 2023-02-04 MED ORDER — METRONIDAZOLE 500 MG PO TABS
500.0000 mg | ORAL_TABLET | Freq: Two times a day (BID) | ORAL | Status: DC
  Administered 2023-02-04 (×2): 500 mg via ORAL
  Filled 2023-02-04 (×2): qty 1

## 2023-02-04 MED ORDER — INSULIN ASPART 100 UNIT/ML IJ SOLN
0.0000 [IU] | Freq: Three times a day (TID) | INTRAMUSCULAR | Status: DC
  Administered 2023-02-04: 2 [IU] via SUBCUTANEOUS
  Administered 2023-02-05: 3 [IU] via SUBCUTANEOUS

## 2023-02-04 NOTE — Evaluation (Signed)
Physical Therapy Evaluation Patient Details Name: [name] MRN: [id number] DOB: 03/01/1961 Today's Date: 02/04/2023  History of Present Illness  62 year old male with diabetes mellitus type 2, IDDM, PAD, diabetic foot ulcers, HLP, anxiety follows with wound care and podiatry presented with left foot ulcer.  Pt s/p left foot I&D, bone biopsy on 02/03/23.  Clinical Impression  Pt admitted with above diagnosis.  Pt currently with functional limitations due to the deficits listed below (see PT Problem List). Pt will benefit from acute skilled PT to increase their independence and safety with mobility to allow discharge.   Pt assisted with initiating ambulation with L foot NWB status.  Pt only able to tolerate short distance due to UE fatigue.  Pt utilized RW today however would like to try crutches tomorrow.  Pt declined trying knee scooter.  Pt left in recliner with LEs elevated.  Pt also did not have post op shoe or CAM walker in orders or in room so requested RN check in with MD (to see if this is wanted especially for d/c).  Pt may also need to return to OR per notes.        Recommendations for follow up therapy are one component of a multi-disciplinary discharge planning process, led by the attending physician.  Recommendations may be updated based on patient status, additional functional criteria and insurance authorization.  Follow Up Recommendations No PT follow up      Assistance Recommended at Discharge    Patient can return home with the following  Help with stairs or ramp for entrance    Equipment Recommendations Other (comment) (TBA, pt wants to try crutches tomorrow, will need either crutches or RW however to d/c due to NWB status)  Recommendations for Other Services       Functional Status Assessment Patient has had a recent decline in their functional status and demonstrates the ability to make significant improvements in function in a reasonable and predictable amount of  time.     Precautions / Restrictions Precautions Precautions: Fall Restrictions Weight Bearing Restrictions: Yes LLE Weight Bearing: Non weight bearing      Mobility  Bed Mobility Overal bed mobility: Modified Independent                  Transfers Overall transfer level: Needs assistance Equipment used: Rolling walker (2 wheels) Transfers: Sit to/from Stand Sit to Stand: Min guard           General transfer comment: cues for hand placement, keeping NWB status    Ambulation/Gait Ambulation/Gait assistance: Min guard Gait Distance (Feet): 20 Feet Assistive device: Rolling walker (2 wheels)   Gait velocity: decr     General Gait Details: swing through with RW, able to maintain NWB but UEs fatigued quickly  Stairs            Wheelchair Mobility    Modified Rankin (Stroke Patients Only)       Balance                                             Pertinent Vitals/Pain Pain Assessment Pain Assessment: No/denies pain    Home Living Family/patient expects to be discharged to:: Private residence Living Arrangements: Alone     Home Access: Level entry       Home Layout: One level Home Equipment: Cane - single point  Prior Function Prior Level of Function : Independent/Modified Independent                     Hand Dominance        Extremity/Trunk Assessment        Lower Extremity Assessment Lower Extremity Assessment: Overall WFL for tasks assessed;LLE deficits/detail;RLE deficits/detail RLE Sensation: history of peripheral neuropathy LLE Deficits / Details: left foot in dressings LLE Sensation: history of peripheral neuropathy       Communication   Communication: No difficulties  Cognition Arousal/Alertness: Awake/alert Behavior During Therapy: WFL for tasks assessed/performed Overall Cognitive Status: Within Functional Limits for tasks assessed                                           General Comments      Exercises     Assessment/Plan    PT Assessment Patient needs continued PT services  PT Problem List Impaired sensation;Decreased mobility;Decreased activity tolerance;Decreased balance;Decreased knowledge of use of DME;Decreased knowledge of precautions       PT Treatment Interventions Gait training;DME instruction;Therapeutic exercise;Balance training;Functional mobility training;Patient/family education;Therapeutic activities    PT Goals (Current goals can be found in the Care Plan section)  Acute Rehab PT Goals PT Goal Formulation: With patient Time For Goal Achievement: 02/18/23 Potential to Achieve Goals: Good    Frequency Min 3X/week     Co-evaluation               AM-PAC PT "6 Clicks" Mobility  Outcome Measure Help needed turning from your back to your side while in a flat bed without using bedrails?: None Help needed moving from lying on your back to sitting on the side of a flat bed without using bedrails?: None Help needed moving to and from a bed to a chair (including a wheelchair)?: A Little Help needed standing up from a chair using your arms (e.g., wheelchair or bedside chair)?: A Little Help needed to walk in hospital room?: A Little Help needed climbing 3-5 steps with a railing? : A Lot 6 Click Score: 19    End of Session Equipment Utilized During Treatment: Gait belt Activity Tolerance: Patient tolerated treatment well Patient left: in chair;with call bell/phone within reach;with chair alarm set Nurse Communication: Mobility status PT Visit Diagnosis: Difficulty in walking, not elsewhere classified (R26.2);Other abnormalities of gait and mobility (R26.89)    Time: [id number] PT Time Calculation (min) (ACUTE ONLY): 11 min   Charges:   PT Evaluation $PT Eval Low Complexity: 1 Low        Kati PT, DPT Physical Therapist Acute Rehabilitation Services Preferred contact method: Secure Chat Weekend Pager Only:  [phone] Office: [place] 02/04/2023, 12:31 PM

## 2023-02-04 NOTE — Progress Notes (Signed)
[facility]                                                                              [name], is a 62 y.o. male, DOB - [birth year], [id number] Admit date - 02/02/2023    Outpatient Primary MD for the [name] is [name], [name]  LOS - 2  days  Chief Complaint  [name] presents with   Wound Infection    L foot       Brief summary   [name] is a 62 year old male with diabetes mellitus type 2, IDDM, PAD, diabetic foot ulcers, HLP, anxiety follows with wound care and podiatry presented with left foot ulcer.  [name] reported that 2 to 3 days ago, noted a rash on his LLE, left foot, it started to hurt and swell and then burst open a day before the admission.  He had difficulty ambulating due to significant pain.  [name] had been on doxycycline for 14 days, started on 3/4 by Dr Blenda Mounts (podiatry).  In ED, temp 98.4, heart rate 98-144, BP 171/117, O2 sats 95 to 98% on RA  Labs showed Na 131, BUN 19, cr 1.26.  Lactic acid 1.7.  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 after midnight  Active Problems:   Hyperlipidemia -Continue Lipitor    Generalized anxiety disorder -Continue Xanax as needed    Type 2 diabetes mellitus with complication, with long-term current use of insulin ([place]), uncontrolled with hyperglycemia -HbA1c pending.  Last hemoglobin A1c in system 11.8 on 04/22/2020 CBG (last 3)  Recent Labs    02/04/23 0339 02/04/23 0753 02/04/23 1143  GLUCAP 122*  166* 170*   -Continue moderate SSI, Semglee 15 units qhs  GERD -Continue PPI  Hypertension -BP now stable continue lisinopril 2.5 mg daily   Mild AKI -Creatinine 1.26, No recent baseline creatinine -Creatinine now improved 1.0, on IVF  Estimated body mass index is 29.84 kg/m as calculated from the following:   Height as of this encounter: 6' (1.829 m).   Weight as of this encounter: 99.8 kg.  Code Status: Full code DVT Prophylaxis:  enoxaparin (LOVENOX) injection 40 mg Start: 02/03/23 1700   Level of Care: Level of care: Telemetry Family Communication: Updated [name] Disposition Plan:      Remains inpatient appropriate: Repeat I&D on 3/25   Procedures:    Consultants:   Podiatry  Antimicrobials:   Anti-infectives (From admission, onward)    Start     Dose/Rate Route Frequency Ordered Stop   02/04/23 1400  ceFEPIme (MAXIPIME) 2 g in sodium chloride 0.9 % 100 mL IVPB        2 g 200 mL/hr over 30 Minutes Intravenous Every 8 hours  02/04/23 1156     02/04/23 1400  metroNIDAZOLE (FLAGYL) tablet 500 mg        500 mg Oral Every 12 hours 02/04/23 1157     02/03/23 1800  vancomycin (VANCOREADY) IVPB 2000 mg/400 mL  Status:  Discontinued        2,000 mg 200 mL/hr over 120 Minutes Intravenous Every 24 hours 02/02/23 2231 02/03/23 1320   02/03/23 1600  vancomycin (VANCOREADY) IVPB 1250 mg/250 mL        1,250 mg 166.7 mL/hr over 90 Minutes Intravenous Every 12 hours 02/03/23 1324     02/03/23 0400  piperacillin-tazobactam (ZOSYN) IVPB 3.375 g  Status:  Discontinued        3.375 g 12.5 mL/hr over 240 Minutes Intravenous Every 8 hours 02/02/23 2231 02/04/23 1156   02/02/23 2030  piperacillin-tazobactam (ZOSYN) IVPB 3.375 g        3.375 g 100 mL/hr over 30 Minutes Intravenous  Once 02/02/23 2020 02/02/23 2224   02/02/23 2030  vancomycin (VANCOCIN) IVPB 1000 mg/200 mL premix        1,000 mg 200 mL/hr over 60 Minutes Intravenous  Once 02/02/23 2020 02/02/23 2253           Medications  atorvastatin  20 mg Oral Daily   colchicine  0.6 mg Oral Daily   enoxaparin (LOVENOX) injection  40 mg Subcutaneous Q24H   insulin aspart  0-15 Units Subcutaneous Q4H   insulin glargine-yfgn  15 Units Subcutaneous QHS   lisinopril  2.5 mg Oral Daily   metroNIDAZOLE  500 mg Oral Q12H   pantoprazole  40 mg Oral Daily      Subjective:   [name] was seen and examined today.  Dressing intact on the left foot.  Denies any specific complaints, chest pain, shortness of breath, nausea or vomiting.  No fevers.  Objective:   Vitals:   02/03/23 1227 02/03/23 1950 02/04/23 0342 02/04/23 0822  BP: 114/70 124/75 125/71 138/79  Pulse: 88 93 88 90  Resp: 18 18 18    Temp: 98 F (36.7 C) 98 F (36.7 C) 98 F (36.7 C)   TempSrc: Oral Oral Oral   SpO2: 93% 98% 95%   Weight:      Height:        Intake/Output Summary (Last 24 hours) at 02/04/2023 1209 Last data filed at 02/04/2023 1209 Gross per 24 hour  Intake 1842.89 ml  Output 1200 ml  Net 642.89 ml     Wt Readings from Last 3 Encounters:  02/02/23 99.8 kg  02/14/22 99.5 kg  05/25/20 99.5 kg    Physical Exam General: Alert and oriented x 3, NAD Cardiovascular: S1 S2 clear, RRR.  Respiratory: CTAB, no wheezing Gastrointestinal: Soft, nontender, nondistended, NBS Ext: no pedal edema bilaterally Neuro: no new deficits Skin: Dressing intact on the left foot Psych: Normal affect     Data Reviewed:  I have personally reviewed following labs    CBC Lab Results  Component Value Date   WBC 13.8 (H) 02/04/2023   RBC 4.19 (L) 02/04/2023   HGB 11.5 (L) 02/04/2023   HCT 36.4 (L) 02/04/2023   MCV 86.9 02/04/2023   MCH 27.4 02/04/2023   PLT 396 02/04/2023   MCHC 31.6 02/04/2023   RDW 13.7 02/04/2023   LYMPHSABS 2.8 02/03/2023   MONOABS 0.8 02/03/2023   EOSABS 0.5 02/03/2023   BASOSABS 0.1 99991111     Last metabolic panel Lab Results  Component Value Date   NA 136 02/04/2023  K 3.8  02/04/2023   CL 101 02/04/2023   CO2 26 02/04/2023   BUN 18 02/04/2023   CREATININE 1.07 02/04/2023   GLUCOSE 130 (H) 02/04/2023   GFRNONAA >60 02/04/2023   GFRAA 108 04/22/2020   CALCIUM 8.8 (L) 02/04/2023   PROT 8.6 (H) 02/02/2023   ALBUMIN 3.6 02/02/2023   LABGLOB 2.9 04/22/2020   AGRATIO 1.5 04/22/2020   BILITOT 0.7 02/02/2023   ALKPHOS 91 02/02/2023   AST 18 02/02/2023   ALT 18 02/02/2023   ANIONGAP 9 02/04/2023    CBG (last 3)  Recent Labs    02/04/23 0339 02/04/23 0753 02/04/23 1143  GLUCAP 122* 166* 170*      Coagulation Profile: Recent Labs  Lab 02/03/23 0044  INR 1.2     Radiology Studies: I have personally reviewed the imaging studies  DG Foot Complete Right  Result Date: 02/03/2023 CLINICAL DATA:  Right foot pain, initial encounter EXAM: RIGHT FOOT COMPLETE - 3+ VIEW COMPARISON:  12/21/2022 FINDINGS: No acute fracture or dislocation is noted. No erosive changes to suggest osteomyelitis are seen. Soft tissue wound is noted near the first MTP joint. The plantar arch is accentuated and stable. IMPRESSION: Soft tissue wound without evidence of osteomyelitis. Electronically Signed   By: Inez Catalina M.D.   On: 02/03/2023 20:07   MR FOOT LEFT W WO CONTRAST  Result Date: 02/02/2023 CLINICAL DATA:  Soft tissue infection suspected. EXAM: MRI OF THE LEFT FOREFOOT WITHOUT AND WITH CONTRAST TECHNIQUE: Multiplanar, multisequence MR imaging of the forefoot was performed both before and after administration of intravenous contrast. CONTRAST:  65mL GADAVIST GADOBUTROL 1 MMOL/ML IV SOLN COMPARISON:  Radiographs dated February 02, 2023. FINDINGS: Bones/Joint/Cartilage There is marrow edema of the base of the proximal phalanx of the first digit with joint effusion. The interphalangeal joint of the first digit and distal phalanx are not included on this examination, which demonstrate advanced osseous erosion on the radiograph performed earlier on the same date, highly suspicious for  septic arthritis. Ligaments Lisfranc ligament is intact. There is thickening of the medial collateral ligament at the first metatarsophalangeal joint. Remaining collateral ligaments appear intact. Muscles and Tendons There is a loculated peripherally enhancing fluid collection about the plantar aspect of the base of the first metatarsal wound about the medial aspect of the foot (series 10, image 18) consistent with abscess. This abscess measures at least 1.5 x 3.0 x 3.0 cm. There is markedly increased signal of the plantar muscles, likely reactive secondary to ongoing infectious/inflammatory process. Generalized soft tissue edema about the foot. Soft tissues Generalized soft tissue swelling about the medial and dorsal aspect of the foot. IMPRESSION: 1. Loculated peripherally enhancing fluid collection about the plantar aspect of the base of the first metatarsal consistent with abscess measuring at least 1.5 x 3.0 x 3.0 cm. 2. Marrow edema of the base of the proximal phalanx of the first digit with joint effusion consistent with osteomyelitis with septic arthritis. 3. The interphalangeal joint of the first digit and distal phalanx are not included on this examination, which demonstrate advanced osseous erosion on the radiograph performed earlier on the same date, highly suspicious for osteomyelitis/septic arthritis. 4. Markedly increased signal of the plantar muscles, likely reactive secondary to ongoing infectious/inflammatory process. Electronically Signed   By: Keane Police D.O.   On: 02/02/2023 23:03   DG Foot Complete Left  Result Date: 02/02/2023 CLINICAL DATA:  Left foot infection EXAM: LEFT FOOT - COMPLETE 3+ VIEW COMPARISON:  12/21/2022 FINDINGS: Progressive  destruction of the proximal and distal phalanx of the left great toe centered at the interphalangeal joint. Prominent soft tissue swelling. No additional sites of bony erosion. No acute fracture or dislocation. There is bandaging material overlying  the forefoot. IMPRESSION: Progressive destruction of the proximal and distal phalanx of the left great toe centered at the interphalangeal joint, compatible with osteomyelitis and septic arthritis. Electronically Signed   By: Davina Poke D.O.   On: 02/02/2023 20:05       Estill Cotta M.D. [facility] 02/04/2023, 12:09 PM  Available via Epic secure chat 7am-7pm After 7 pm, please refer to night coverage provider listed on amion.

## 2023-02-04 NOTE — Progress Notes (Signed)
PODIATRY PROGRESS NOTE  NAME Lily Dutko MRN [id number] DOB Mar 28, 1961 DOA 02/02/2023   Reason for consult:  Chief Complaint  Patient presents with   Wound Infection    L foot     History of present illness: 62 y.o. male  POD # 1 s/p left foot I&D, bone biopsy.  He is doing well this morning.  Denies any fevers or chills.  No new concerns today.  Vitals:   02/04/23 0342 02/04/23 0822  BP: 125/71 138/79  Pulse: 88 90  Resp: 18   Temp: 98 F (36.7 C)   SpO2: 95%        Latest Ref Rng & Units 02/04/2023    4:43 AM 02/03/2023   12:44 AM 02/02/2023    8:06 PM  CBC  WBC 4.0 - 10.5 K/uL 13.8  14.6  17.1   Hemoglobin 13.0 - 17.0 g/dL 11.5  12.5  13.1   Hematocrit 39.0 - 52.0 % 36.4  39.1  40.5   Platelets 150 - 400 K/uL 396  344  377        Latest Ref Rng & Units 02/04/2023    4:43 AM 02/03/2023   12:44 AM 02/02/2023    8:06 PM  BMP  Glucose 70 - 99 mg/dL 130  141  201   BUN 8 - 23 mg/dL 18  16  19    Creatinine 0.61 - 1.24 mg/dL 1.07  1.03  1.26   Sodium 135 - 145 mmol/L 136  135  131   Potassium 3.5 - 5.1 mmol/L 3.8  3.5  3.9   Chloride 98 - 111 mmol/L 101  99  96   CO2 22 - 32 mmol/L 26  24  21    Calcium 8.9 - 10.3 mg/dL 8.8  9.0  9.2       Physical Exam: General: AAOx3, NAD  Dermatology: Incisions on the hallux with sutures intact.  On the medial aspect, arch of the left foot are 3 areas which are full-thickness and they do connect to each other from the area of the I&D.  There is some blister formation on the 2 proximal aspects and is able to debride this today and there is no purulence.  There is decreased edema and erythema present.  There is no fluctuation today.       Vascular: DP pulse palpable, foot is warm and well-perfused  Neurological: Sensation decreased  Musculoskeletal Exam: Mild discomfort on exam.    ASSESSMENT/PLAN OF CARE  POD # 1 s/p left foot I&D, bone biopsy; foot stable ulcer  -Dressing was changed today.  Irrigated the wound  site with saline and repacked it with iodoform packing. -Clinically he is improving his white blood cell count is trending down.  He is afebrile. -I discussed POSSIBLE need to return to the OR tomorrow for repeat washout, debridement however we will see how he is responding clinically as well making sure his white blood cell count continues to improve.  If he does not need to go back to the OR we will follow-up cultures for antibiotic recommendations.  Given the osteomyelitis would recommend infectious disease consultation for recommendations on antibiotics. -Continue current antibiotics. -Wound culture 3/23- NGTD- prelim -Bone culture 3/23- NGTD- prelim -Nonweightbearing.  PT consult -TOC consult- will need home health; dressing changes at least every other day.  Irrigate wounds with saline.  Iodoform packing and covering with Betadine around the wounds followed by 4 x 4's, Kerlix, Ace bandage. -Podiatry will continue to follow.  I will discuss with Dr. Blenda Mounts who is taking over tomorrow.  Patient is aware of this as well.    Please contact me directly with any questions or concerns.     Celesta Gentile, DPM [facility]  Dr. Bonna Gains. [address], [address] [place], [place] [address]                Office [phone]  Fax [phone]

## 2023-02-05 ENCOUNTER — Ambulatory Visit: Payer: 59 | Admitting: Podiatry

## 2023-02-05 ENCOUNTER — Telehealth: Payer: Self-pay | Admitting: Podiatry

## 2023-02-05 ENCOUNTER — Other Ambulatory Visit (HOSPITAL_COMMUNITY): Payer: Self-pay

## 2023-02-05 DIAGNOSIS — Z794 Long term (current) use of insulin: Secondary | ICD-10-CM | POA: Diagnosis not present

## 2023-02-05 DIAGNOSIS — E1169 Type 2 diabetes mellitus with other specified complication: Secondary | ICD-10-CM | POA: Diagnosis not present

## 2023-02-05 DIAGNOSIS — M869 Osteomyelitis, unspecified: Secondary | ICD-10-CM | POA: Diagnosis not present

## 2023-02-05 DIAGNOSIS — E119 Type 2 diabetes mellitus without complications: Secondary | ICD-10-CM | POA: Diagnosis not present

## 2023-02-05 DIAGNOSIS — F411 Generalized anxiety disorder: Secondary | ICD-10-CM | POA: Diagnosis not present

## 2023-02-05 DIAGNOSIS — E118 Type 2 diabetes mellitus with unspecified complications: Secondary | ICD-10-CM | POA: Diagnosis not present

## 2023-02-05 DIAGNOSIS — L02612 Cutaneous abscess of left foot: Secondary | ICD-10-CM | POA: Diagnosis not present

## 2023-02-05 LAB — HEMOGLOBIN A1C
Hgb A1c MFr Bld: 8.9 % — ABNORMAL HIGH (ref 4.8–5.6)
Mean Plasma Glucose: 209 mg/dL

## 2023-02-05 LAB — BASIC METABOLIC PANEL
Anion gap: 12 (ref 5–15)
BUN: 13 mg/dL (ref 8–23)
CO2: 26 mmol/L (ref 22–32)
Calcium: 9.1 mg/dL (ref 8.9–10.3)
Chloride: 100 mmol/L (ref 98–111)
Creatinine, Ser: 1.06 mg/dL (ref 0.61–1.24)
GFR, Estimated: >60 mL/min (ref >60)
Glucose, Bld: 173 mg/dL — ABNORMAL HIGH (ref 70–99)
Potassium: 4.1 mmol/L (ref 3.5–5.1)
Sodium: 138 mmol/L (ref 135–145)

## 2023-02-05 LAB — CBC
HCT: 36.2 % — ABNORMAL LOW (ref 39.0–52.0)
Hemoglobin: 11.5 g/dL — ABNORMAL LOW (ref 13.0–17.0)
MCH: 27.2 pg (ref 26.0–34.0)
MCHC: 31.8 g/dL (ref 30.0–36.0)
MCV: 85.6 fL (ref 80.0–100.0)
Platelets: 338 10*3/uL (ref 150–400)
RBC: 4.23 MIL/uL (ref 4.22–5.81)
RDW: 13.3 % (ref 11.5–15.5)
WBC: 10.3 10*3/uL (ref 4.0–10.5)
nRBC: 0 % (ref 0.0–0.2)

## 2023-02-05 LAB — GLUCOSE, CAPILLARY: Glucose-Capillary: 193 mg/dL — ABNORMAL HIGH (ref 70–99)

## 2023-02-05 MED ORDER — DOXYCYCLINE HYCLATE 100 MG PO TABS: 100.0000 mg | ORAL_TABLET | Freq: Two times a day (BID) | ORAL | Status: DC

## 2023-02-05 MED ORDER — LISINOPRIL 5 MG PO TABS
2.5000 mg | ORAL_TABLET | Freq: Every day | ORAL | 1 refills | Status: AC
  Filled 2023-02-05: qty 45, 90d supply, fill #0
  Filled 2023-02-05: qty 50, 100d supply, fill #0

## 2023-02-05 MED ORDER — AMOXICILLIN-POT CLAVULANATE 875-125 MG PO TABS: 1.0000 | ORAL_TABLET | Freq: Two times a day (BID) | ORAL | 0 refills | Status: DC

## 2023-02-05 MED ORDER — AMOXICILLIN-POT CLAVULANATE 875-125 MG PO TABS: 1.0000 | ORAL_TABLET | Freq: Two times a day (BID) | ORAL | Status: DC

## 2023-02-05 MED ORDER — DOXYCYCLINE HYCLATE 100 MG PO TABS: 100.0000 mg | ORAL_TABLET | Freq: Two times a day (BID) | ORAL | 0 refills | Status: DC

## 2023-02-05 MED ORDER — TRAMADOL HCL 50 MG PO TABS
50.0000 mg | ORAL_TABLET | Freq: Three times a day (TID) | ORAL | 0 refills | Status: DC | PRN
  Filled 2023-02-05: qty 21, 7d supply, fill #0

## 2023-02-05 MED ORDER — DOXYCYCLINE HYCLATE 100 MG PO TABS
100.0000 mg | ORAL_TABLET | Freq: Two times a day (BID) | ORAL | 0 refills | Status: DC
  Filled 2023-02-05 – 2023-02-08 (×3): qty 80, 40d supply, fill #0

## 2023-02-05 MED ORDER — AMOXICILLIN-POT CLAVULANATE 875-125 MG PO TABS
1.0000 | ORAL_TABLET | Freq: Two times a day (BID) | ORAL | 0 refills | Status: AC
  Filled 2023-02-05: qty 80, 40d supply, fill #0

## 2023-02-05 NOTE — Discharge Summary (Signed)
Physician Discharge Summary   Patient: [name] MRN: [id number] DOB: 08-08-1961  Admit date:     02/02/2023  Discharge date: 02/05/23  Discharge Physician: Estill Cotta, MD    PCP: Patient, [name]   Recommendations at discharge:   Per ID recommendations, placed on doxycycline and Augmentin for 6 weeks, end date of antibiotics 03/17/2023 Outpatient follow-up with Dr. Juleen China on 02/20/2023 at 4 PM  Discharge Diagnoses:    Toe osteomyelitis, left (HCC)   Abscess of left foot   Hyperlipidemia   Generalized anxiety disorder   Type 2 diabetes mellitus with complication, with long-term current use of insulin ([place]) Hypertension   Hospital Course:  Patient is a 62 year old male with diabetes mellitus type 2, IDDM, PAD, diabetic foot ulcers, HLP, anxiety follows with wound care and podiatry presented with left foot ulcer.  Patient reported that 2 to 3 days ago, noted a rash on his LLE, left foot, it started to hurt and swell and then burst open a day before the admission.  He had difficulty ambulating due to significant pain.  Patient had been on doxycycline for 14 days, started on 3/4 by Dr Blenda Mounts (podiatry).  In ED, temp 98.4, heart rate 98-144, BP 171/117, O2 sats 95 to 98% on RA   Labs showed Na 131, BUN 19, cr 1.26.  Lactic acid 1.7.  WBC 17.1  Baseline creatinine 1.1 on 02/14/2022 MRI showed loculated abscess 1.5x 3x 3 cm, at the base of the first metatarsal, osteomyelitis of the first digit with septic arthritis  Assessment and Plan:  Left first metatarsal osteomyelitis ([place]), abscess, septic arthritis Right foot ulcer, plantar aspect -Patient was placed on IV vancomycin, cefepime and Flagyl -Podiatry service was consulted, underwent I&D left foot and bone biopsy on 3/23 with Dr. Earleen Newport -Seen by Dr. Blenda Mounts this morning, do not feel he needs to return to the OR again -ID was consulted, seen by Dr. Juleen China, recommended doxycycline and Augmentin for 6 weeks and outpatient follow-up  scheduled.  Podiatry will follow cultures and bone biopsy results outpatient. -Dressing changes were explained to the patient by podiatry and he feels he will be able to manage.  Really wants to go home today.      Hyperlipidemia -Continue Lipitor     Generalized anxiety disorder -Continue Xanax as needed     Type 2 diabetes mellitus with complication, with long-term current use of insulin ([name]), uncontrolled with hyperglycemia - Last hemoglobin A1c in system 11.8 on 04/22/2020 -Hemoglobin A1c improving 8.9 -Continue current outpatient regimen    GERD -Continue PPI   Hypertension -BP now stable continue lisinopril 2.5 mg daily    Mild AKI -Creatinine 1.26, No recent baseline creatinine -Creatinine improved with gentle hydration   Estimated body mass index is 29.84 kg/m as calculated from the following:   Height as of this encounter: 6' (1.829 m).   Weight as of this encounter: 99.8 kg.         Pain control - [employer] Controlled Substance Reporting System database was reviewed. and patient was instructed, not to drive, operate heavy machinery, perform activities at heights, swimming or participation in water activities or provide baby-sitting services while on Pain, Sleep and Anxiety Medications; until their outpatient Physician has advised to do so again. Also recommended to not to take more than prescribed Pain, Sleep and Anxiety Medications.  Consultants: Podiatry, infectious ease Procedures performed: Left foot I&D with bone biopsy Disposition: Home Diet recommendation:  Discharge Diet Orders (From admission, onward)  Start     Ordered   02/05/23 0000  Diet - low sodium heart healthy        02/05/23 1007           Carb modified diet DISCHARGE MEDICATION: Allergies as of 02/05/2023   Not on File      Medication List     STOP taking these medications    atorvastatin 20 MG tablet Commonly known as: LIPITOR   losartan 25 MG tablet Commonly  known as: COZAAR       TAKE these medications    ALPRAZolam 0.5 MG tablet Commonly known as: XANAX TAKE 1/2 TO 1 (ONE-HALF TO ONE) TABLET BY MOUTH AT BEDTIME AS NEEDED FOR ANXIETY OR SLEEP   amoxicillin-clavulanate 875-125 MG tablet Commonly known as: AUGMENTIN Take 1 tablet by mouth 2 (two) times daily.   aspirin 81 MG tablet Take 1 tablet (81 mg total) by mouth daily.   blood glucose meter kit and supplies Kit Per insurance preference. Check blood glucose once a day. Dx E11.9, Z79.4   colchicine 0.6 MG tablet Take 0.6 mg by mouth daily.   doxycycline 100 MG tablet Commonly known as: VIBRA-TABS Take 1 tablet (100 mg total) by mouth 2 (two) times daily. What changed: Another medication with the same name was removed. Continue taking this medication, and follow the directions you see here.   gabapentin 300 MG capsule Commonly known as: NEURONTIN Take 1 capsule (300 mg total) by mouth 3 (three) times daily.   glucose blood test strip Test blood sugar daily. Dx code: 250.60.   Lancets Misc Test blood sugar daily. Dx code 250.60   Lantus SoloStar 100 UNIT/ML Solostar Pen Generic drug: insulin glargine Inject 16 Units into the skin at bedtime.   latanoprost 0.005 % ophthalmic solution Commonly known as: XALATAN Place 1 drop into both eyes at bedtime.   lisinopril 5 MG tablet Commonly known as: ZESTRIL Take 0.5 tablets (2.5 mg total) by mouth daily.   omeprazole 40 MG capsule Commonly known as: PRILOSEC Take 1 capsule (40 mg total) by mouth daily.   Pen Needles 32G X 6 MM Misc 1 each by Does not apply route at bedtime.   sildenafil 100 MG tablet Commonly known as: VIAGRA Take 100 mg by mouth as needed for erectile dysfunction.   traMADol 50 MG tablet Commonly known as: Ultram Take 1 tablet (50 mg total) by mouth every 8 (eight) hours as needed for severe pain or moderate pain.   Trulicity 3 0000000 Sopn Generic drug: Dulaglutide Inject 3 mg into the skin  once a week.               Durable Medical Equipment  (From admission, onward)           Start     Ordered   02/05/23 1023  For home use only DME Walker rolling  Once       Question Answer Comment  Walker: With 5 Inch Wheels   Patient needs a walker to treat with the following condition Gait instability      02/05/23 1022              Discharge Care Instructions  (From admission, onward)           Start     Ordered   02/05/23 0000  Leave dressing on - Keep it clean, dry, and intact until clinic visit        02/05/23 1007   02/05/23 0000  Leave dressing on - Keep it clean, dry, and intact until clinic visit        02/05/23 1210            Follow-up Information     Lorenda Peck, DPM. Schedule an appointment as soon as possible for a visit in 1 week(s).   Specialty: Podiatry Why: for hospital follow-up Contact information: [address] [address] [phone]         Mignon Pine, DO Follow up on 02/20/2023.   Specialties: Infectious Diseases, Internal Medicine Why: at [facility] information: [address] [place] [address] [phone]                Discharge Exam: Danley Danker Weights   02/02/23 1917  Weight: 99.8 kg   S: No acute complaints, really wants to go home today.  No acute issues overnight.  BP (!) 149/74 (BP Location: Right Arm)   Pulse 100   Temp 98.4 F (36.9 C) (Oral)   Resp 18   Ht 6' (1.829 m)   Wt 99.8 kg   SpO2 97%   BMI 29.84 kg/m   Physical Exam General: Alert and oriented x 3, NAD Cardiovascular: S1 S2 clear, RRR.  Respiratory: CTAB, no wheezing, rales or rhonchi Gastrointestinal: Soft, nontender, nondistended, NBS Ext: no pedal edema bilaterally Neuro: no new deficits Skin: Dressing intact left foot Psych: Normal affect and demeanor, alert and oriented x3    Condition at discharge: fair  The results of significant diagnostics from this  hospitalization (including imaging, microbiology, ancillary and laboratory) are listed below for reference.   Imaging Studies: DG Foot Complete Right  Result Date: 02/03/2023 CLINICAL DATA:  Right foot pain, initial encounter EXAM: RIGHT FOOT COMPLETE - 3+ VIEW COMPARISON:  12/21/2022 FINDINGS: No acute fracture or dislocation is noted. No erosive changes to suggest osteomyelitis are seen. Soft tissue wound is noted near the first MTP joint. The plantar arch is accentuated and stable. IMPRESSION: Soft tissue wound without evidence of osteomyelitis. Electronically Signed   By: Inez Catalina M.D.   On: 02/03/2023 20:07   MR FOOT LEFT W WO CONTRAST  Result Date: 02/02/2023 CLINICAL DATA:  Soft tissue infection suspected. EXAM: MRI OF THE LEFT FOREFOOT WITHOUT AND WITH CONTRAST TECHNIQUE: Multiplanar, multisequence MR imaging of the forefoot was performed both before and after administration of intravenous contrast. CONTRAST:  29mL GADAVIST GADOBUTROL 1 MMOL/ML IV SOLN COMPARISON:  Radiographs dated February 02, 2023. FINDINGS: Bones/Joint/Cartilage There is marrow edema of the base of the proximal phalanx of the first digit with joint effusion. The interphalangeal joint of the first digit and distal phalanx are not included on this examination, which demonstrate advanced osseous erosion on the radiograph performed earlier on the same date, highly suspicious for septic arthritis. Ligaments Lisfranc ligament is intact. There is thickening of the medial collateral ligament at the first metatarsophalangeal joint. Remaining collateral ligaments appear intact. Muscles and Tendons There is a loculated peripherally enhancing fluid collection about the plantar aspect of the base of the first metatarsal wound about the medial aspect of the foot (series 10, image 18) consistent with abscess. This abscess measures at least 1.5 x 3.0 x 3.0 cm. There is markedly increased signal of the plantar muscles, likely reactive secondary  to ongoing infectious/inflammatory process. Generalized soft tissue edema about the foot. Soft tissues Generalized soft tissue swelling about the medial and dorsal aspect of the foot. IMPRESSION: 1. Loculated peripherally enhancing fluid  collection about the plantar aspect of the base of the first metatarsal consistent with abscess measuring at least 1.5 x 3.0 x 3.0 cm. 2. Marrow edema of the base of the proximal phalanx of the first digit with joint effusion consistent with osteomyelitis with septic arthritis. 3. The interphalangeal joint of the first digit and distal phalanx are not included on this examination, which demonstrate advanced osseous erosion on the radiograph performed earlier on the same date, highly suspicious for osteomyelitis/septic arthritis. 4. Markedly increased signal of the plantar muscles, likely reactive secondary to ongoing infectious/inflammatory process. Electronically Signed   By: Keane Police D.O.   On: 02/02/2023 23:03   DG Foot Complete Left  Result Date: 02/02/2023 CLINICAL DATA:  Left foot infection EXAM: LEFT FOOT - COMPLETE 3+ VIEW COMPARISON:  12/21/2022 FINDINGS: Progressive destruction of the proximal and distal phalanx of the left great toe centered at the interphalangeal joint. Prominent soft tissue swelling. No additional sites of bony erosion. No acute fracture or dislocation. There is bandaging material overlying the forefoot. IMPRESSION: Progressive destruction of the proximal and distal phalanx of the left great toe centered at the interphalangeal joint, compatible with osteomyelitis and septic arthritis. Electronically Signed   By: Davina Poke D.O.   On: 02/02/2023 20:05   VAS [place] ABI WITH/WO TBI  Result Date: 01/26/2023  LOWER EXTREMITY DOPPLER STUDY Patient Name:  GRACE AMBROGIO  Date of Exam:   01/25/2023 Medical Rec #: [id number]     Accession #:    [id number] Date of Birth: 12/29/1960     Patient Gender: M Patient Age:   54 years Exam Location:  [facility] Procedure:      VAS [place] ABI WITH/WO TBI Referring Phys: Sheppard Coil STANDIFORD --------------------------------------------------------------------------------  Indications: Ulceration. High Risk Factors: Hyperlipidemia, Diabetes.  Limitations: Today's exam was limited due to bandages and an open wound. Performing Technologist: Elta Guadeloupe RVT, RDMS  Examination Guidelines: A complete evaluation includes at minimum, Doppler waveform signals and systolic blood pressure reading at the level of bilateral brachial, anterior tibial, and posterior tibial arteries, when vessel segments are accessible. Bilateral testing is considered an integral part of a complete examination. Photoelectric Plethysmograph (PPG) waveforms and toe systolic pressure readings are included as required and additional duplex testing as needed. Limited examinations for reoccurring indications may be performed as noted.  ABI Findings: +---------+------------------+-----+-----------+--------+ Right    Rt Pressure (mmHg)IndexWaveform   Comment  +---------+------------------+-----+-----------+--------+ Brachial 169                                        +---------+------------------+-----+-----------+--------+ PTA      221               1.23 multiphasic         +---------+------------------+-----+-----------+--------+ DP       188               1.04 multiphasic         +---------+------------------+-----+-----------+--------+ Great Toe160               0.89 Normal              +---------+------------------+-----+-----------+--------+ +---------+------------------+-----+-----------+-------------------------------+ Left     Lt Pressure (mmHg)IndexWaveform   Comment                         +---------+------------------+-----+-----------+-------------------------------+ Brachial 180                                                                +---------+------------------+-----+-----------+-------------------------------+  PTA      217               1.21 multiphasic                                +---------+------------------+-----+-----------+-------------------------------+ DP       197               1.09 multiphasic                                +---------+------------------+-----+-----------+-------------------------------+ Great Toe213               1.18 Abnormal   Toe is too large/swollen and                                               the digital pressure is likely                                             innaccurate.                    +---------+------------------+-----+-----------+-------------------------------+ +-------+-----------+-----------+------------+------------+ ABI/TBIToday's ABIToday's TBIPrevious ABIPrevious TBI +-------+-----------+-----------+------------+------------+ Right  1.23       0.89                                +-------+-----------+-----------+------------+------------+ Left   1.21       1.18                                +-------+-----------+-----------+------------+------------+  Arterial wall calcification precludes accurate ankle pressures and ABIs.  Summary: Right: Resting right ankle-brachial index is within normal range. The right toe-brachial index is normal. Left: Resting left ankle-brachial index is within normal range. The left toe-brachial index is normal. *See table(s) above for measurements and observations.  Electronically signed by Orlie Pollen on 01/26/2023 at 6:02:08 PM.    Final     Microbiology: Results for orders placed or performed during the hospital encounter of 02/02/23  Blood culture (routine x 2)     Status: None (Preliminary result)   Collection Time: 02/03/23 12:44 AM   Specimen: BLOOD  Result Value Ref Range Status   Specimen Description   Final    BLOOD BLOOD RIGHT HAND Performed at [facility], [place]  [address] [place], [place] [address]    Special Requests   Final    BOTTLES DRAWN AEROBIC ONLY Blood Culture adequate volume Performed at [facility] [address], [place], [place] [address]    Culture   Final    NO GROWTH 2 DAYS Performed at [facility] [address], [place], [place] [address]    Report Status PENDING  Incomplete  Blood culture (routine x 2)     Status: None (Preliminary result)   Collection Time: 02/03/23 12:44 AM   Specimen: BLOOD  Result Value Ref Range Status   Specimen Description   Final    BLOOD BLOOD LEFT HAND Performed at [facility]  [facility], [place] [address] [place], [place] [address]    Special Requests   Final    BOTTLES DRAWN AEROBIC ONLY Blood Culture adequate volume Performed at [facility] [address], [place], [place] [address]    Culture   Final    NO GROWTH 2 DAYS Performed at [facility] [address], [place], [place] [address]    Report Status PENDING  Incomplete  Aerobic/Anaerobic Culture w Gram Stain (surgical/deep wound)     Status: None (Preliminary result)   Collection Time: 02/03/23 10:55 AM   Specimen: PATH Bone biopsy; Tissue  Result Value Ref Range Status   Specimen Description   Final    BIOPSY BONE Performed at [facility] [address] [place], [place] [address]    Special Requests LEFT FOOT  Final   Gram Stain NO ORGANISMS SEEN NO WBC SEEN   Final   Culture   Final    NO GROWTH 2 DAYS Performed at [facility], [address], [place], [place] [address]    Report Status PENDING  Incomplete  Aerobic/Anaerobic Culture w Gram Stain (surgical/deep wound)     Status: Abnormal (Preliminary result)   Collection Time: 02/03/23 10:55 AM   Specimen: Wound; Abscess  Result Value Ref Range Status   Specimen Description   Final    WOUND Performed at [facility] [address] [place], [place]  [address]    Special Requests LEFT FOOT  Final   Gram Stain NO ORGANISMS SEEN NO WBC SEEN   Final   Culture (A)  Final    GROUP B STREP(S.AGALACTIAE)ISOLATED Beta hemolytic streptococci are predictably susceptible to penicillin and other beta lactams. Susceptibility testing not routinely performed. Performed at [facility], [place] [address], [place],  [address]    Report Status PENDING  Incomplete    Labs: CBC: Recent Labs  Lab 02/02/23 2006 02/03/23 0044 02/04/23 0443 02/05/23 0453  WBC 17.1* 14.6* 13.8* 10.3  NEUTROABS 13.6* 10.3*  --   --   HGB 13.1 12.5* 11.5* 11.5*  HCT 40.5 39.1 36.4* 36.2*  MCV 85.1 85.9 86.9 85.6  PLT 377 344 396 [id number]   Basic Metabolic Panel: Recent Labs  Lab 02/02/23 2006 02/03/23 0044 02/04/23 0443 02/05/23 0453  NA 131* 135 136 138  K 3.9 3.5 3.8 4.1  CL 96* 99 101 100  CO2 21* 24 26 26   GLUCOSE 201* 141* 130* 173*  BUN 19 16 18 13   CREATININE 1.26* 1.03 1.07 1.06  CALCIUM 9.2 9.0 8.8* 9.1   Liver Function Tests: Recent Labs  Lab 02/02/23 2006  AST 18  ALT 18  ALKPHOS 91  BILITOT 0.7  PROT 8.6*  ALBUMIN 3.6   CBG: Recent Labs  Lab 02/04/23 1143 02/04/23 1606 02/04/23 1703 02/04/23 1952 02/05/23 0759  GLUCAP 170* 146* 134* 193* 193*    Discharge time spent: greater than 30 minutes.  Signed: Estill Cotta, MD [facility] 02/05/2023

## 2023-02-05 NOTE — Op Note (Signed)
PATIENT:  [name]  62 y.o. male   PRE-OPERATIVE DIAGNOSIS:  LEFT FOOT ABSCESS; OSTEOMYELITIS   POST-OPERATIVE DIAGNOSIS:  LEFT FOOT ABSCESS; OSTEOMYELITIS   PROCEDURE:  Procedure(s): IRRIGATION AND DEBRIDEMENT LEFT FOOT AND BONE BIOPSY (Left)   SURGEON:  Surgeon(s) and Role:    * Trula Slade, DPM - Primary   PHYSICIAN ASSISTANT:    ASSISTANTS: none    ANESTHESIA:   general   EBL:  25 mL    BLOOD ADMINISTERED:none   DRAINS: none    LOCAL MEDICATIONS USED:  MARCAINE   , BUPIVICAINE , and Amount: 20 ml   SPECIMEN:  Source of Specimen:  Hallux bone for micro, wound culture (abscess)   DISPOSITION OF SPECIMEN:  PATHOLOGY   COUNTS:  YES   TOURNIQUET:  * No tourniquets in log *   DICTATION: .Dragon Dictation   PLAN OF CARE: Admit to inpatient    PATIENT DISPOSITION:  PACU - hemodynamically stable.   Delay start of Pharmacological VTE agent (>24hrs) due to surgical blood loss or risk of bleeding: no   Intraoperative findings: Hallux bone is soft c/w OM. I&D arch of the foot and large abscess noted. 3 separate incisions made. Debrided necrotic, nonviable tissue. Quite a bit of nonviable tendon was removed from the arch of the foot. After debridement, no further purulence. Packed wounds open. Will plan for dressing change and await clinical response for possible return to the OR early next week if needed.   Indications for surgery: 62 year old male was admitted to the hospital for concerns of infection of the left foot and found to have an abscess as well as osteomyelitis.  Given the infection discussed with him he believes he is to proceed with incision and drainage and debridement of the abscess to the arch of the foot.  I recommended amputation of the big toe with the patient family did not want to have this performed.  We had a discussion regards to infection that he is at high risk of limb loss given both these infections.  After discussion we will proceed with  I&D but would not proceed with amputation.  Discussed bone biopsy he agrees with this.  Alternatives risks and complications were discussed.  No promises or guarantees given aspect of the procedure all questions answered the best my ability.  Procedure in detail: The patient was both verbally and visually identified by myself, nursing staff, the anesthesia staff preoperatively.  He was then transferred to the operating room via stretcher with the surgery took place.  He was intubated by anesthesia.  Left lower extremities and scrubbed, prepped and draped in normal sterile fashion.  Attention was first directed along the area of the hallux with a bone biopsy.  On the toe there is consistent with osteomyelitis small stab incision was made and I utilized a Jamshidi needle to remove a piece of bone and sent this to microbiology.  The bone was very soft consistent with osteomyelitis.  I did make a small stab incision make sure there is no purulence from the joint as well which was not able to remove any only bloody drainage.  Specimen was passed off the table and properly identified.  Incision was irrigated with saline.  Single 3-0 nylon sutures placed on the incision.  Attention was directed along the plantar aspect the arch on the area with notable abscess.  There is purulence coming from the area on the arch of the foot there was an area that I initially made  an incision with a 15 with scalpel and purulence was identified and this was cultured.  This appeared to be tracking to the plantar musculature in multiple layers of the foot plantarly.  I did excise the skin that was necrotic and nonviable with infected tissue.  After I removed this there created the wound approximately 1.5 x 1.5 cm.  There is noted to be tracking proximally distally and there was notable abscess on the skin as well.  Both these areas were opened up and excised.  Distally I was able to excise skin that was infected, nonviable and measured  about 0.6 x 0.6 cm and proximally made an incision to excise the nonviable infected tissue as well.  All these wounds did connect to each other.  I was able to remove quite a bit of purulence.  I decompressed all layers of the foot and this was a deep complex abscess.  After decompression I was not able to identify any further purulence.  I copiously irrigated with 3 L of saline.  Upon further inspection no further purulence was noted but I was able to further debride some nonviable tendon and muscle to the arch of the foot as it was infected.  Further inspection revealed that the tissue was healthy.  I did pack the wound in all layers without a form packing.  Dry sterile dressing was applied.  He was woken up anesthesia and found to tolerate the procedure well any complications.  Transferred to PACU vital stable and vascular status intact.

## 2023-02-05 NOTE — Care Management Important Message (Signed)
Important Message  Patient Details IM Letter given. Name: [name] MRN: [id number] Date of Birth: 01/06/1961   Medicare Important Message Given:  Yes     Kerin Salen 02/05/2023, 11:40 AM

## 2023-02-05 NOTE — Progress Notes (Signed)
Physical Therapy Treatment Patient Details Name: [name] MRN: [id number] DOB: Apr 15, 1961 Today's Date: 02/05/2023   History of Present Illness 62 year old male with diabetes mellitus type 2, IDDM, PAD, diabetic foot ulcers, HLP, anxiety follows with wound care and podiatry presented with left foot ulcer.  Pt s/p left foot I&D, bone biopsy on 02/03/23.    PT Comments    Pt reports discharging soon but did want to try crutches.  Pt ambulated short distance with crutches and realized he much preferred RW (see mobility section below).  RW had already been delivered to his room.  Pt had post op shoe present today however not donned on arrival so educated pt to wear shoe with standing and ambulating.  Pt agreeable to ice and elevate left foot when resting.  Pt very eager to d/c home today.    Recommendations for follow up therapy are one component of a multi-disciplinary discharge planning process, led by the attending physician.  Recommendations may be updated based on patient status, additional functional criteria and insurance authorization.  Follow Up Recommendations       Assistance Recommended at Discharge    Patient can return home with the following Help with stairs or ramp for entrance   Equipment Recommendations  Rolling walker (2 wheels)    Recommendations for Other Services       Precautions / Restrictions Precautions Precautions: Fall Restrictions Weight Bearing Restrictions: Yes LLE Weight Bearing: Non weight bearing     Mobility  Bed Mobility Overal bed mobility: Modified Independent                  Transfers Overall transfer level: Modified independent                 General transfer comment: pt keeps left knee on bed to keep foot propped up and maintain NWB    Ambulation/Gait Ambulation/Gait assistance: Min guard Gait Distance (Feet): 12 Feet Assistive device: Crutches         General Gait Details: attempted gait training with  crutches and pt performed a few feet but decided he did not feel stable enough and felt more tempted to put weight on left foot so he does indeed prefer RW, ambulated a few feet back to bed with RW; able to maintain NWB with RW well   Stairs             Wheelchair Mobility    Modified Rankin (Stroke Patients Only)       Balance                                            Cognition Arousal/Alertness: Awake/alert Behavior During Therapy: WFL for tasks assessed/performed Overall Cognitive Status: Within Functional Limits for tasks assessed                                          Exercises      General Comments        Pertinent Vitals/Pain Pain Assessment Pain Assessment: No/denies pain    Home Living                          Prior Function            PT Goals (  current goals can now be found in the care plan section) Progress towards PT goals: Progressing toward goals    Frequency    Min 3X/week      PT Plan Current plan remains appropriate    Co-evaluation              AM-PAC PT "6 Clicks" Mobility   Outcome Measure  Help needed turning from your back to your side while in a flat bed without using bedrails?: None Help needed moving from lying on your back to sitting on the side of a flat bed without using bedrails?: None Help needed moving to and from a bed to a chair (including a wheelchair)?: A Little Help needed standing up from a chair using your arms (e.g., wheelchair or bedside chair)?: A Little Help needed to walk in hospital room?: A Little Help needed climbing 3-5 steps with a railing? : A Lot 6 Click Score: 19    End of Session Equipment Utilized During Treatment: Gait belt Activity Tolerance: Patient tolerated treatment well Patient left: in bed;with call bell/phone within reach   PT Visit Diagnosis: Difficulty in walking, not elsewhere classified (R26.2);Other abnormalities of  gait and mobility (R26.89)     Time: [id number] PT Time Calculation (min) (ACUTE ONLY): 12 min  Charges:  $Gait Training: 8-22 mins                     Arlyce Dice, DPT Physical Therapist Acute Rehabilitation Services Office: [place] 02/05/2023, 3:21 PM

## 2023-02-05 NOTE — TOC Transition Note (Signed)
Transition of Care [facility]) - CM/SW Discharge Note   Patient Details  Name: [name] MRN: [id number] Date of Birth: 12/12/1960  [facility]) CM/SW Contact:  Vassie Moselle, LCSW Phone Number: 02/05/2023, 11:12 AM   Clinical Narrative:    Pt requesting RW for home. RW has been ordered through [facility] and has been delivered to pt's room. Pt also requesting HHRN for [place] and education with managing his diabetes. [facility]'s unable to accept pt for [facility] at this time. Pt notified.  Pt taking uber home. Meds to be delivered to room prior to discharge,    Final next level of care: Home w Home Health Services Barriers to Discharge: No Barriers Identified   Patient Goals and CMS Choice CMS Medicare.gov Compare Post Acute Care list provided to:: Patient Choice offered to / list presented to : Patient  Discharge Placement                         Discharge Plan and Services Additional resources added to the After Visit Summary for                  DME Arranged: Walker rolling DME Agency: [employer] Date DME Agency Contacted: 02/05/23 Time DME Agency Contacted: 1111 Representative spoke with at DME Agency: Erasmo Downer HH Arranged: RN [place] Agency: [place] Date [place]: 02/05/23 Time [place]: 1112 Representative spoke with at [place]: [place] ([place]) Interventions SDOH Screenings   Food Insecurity: No Food Insecurity (02/03/2023)  Housing: Low Risk  (02/03/2023)  Transportation Needs: No Transportation Needs (02/03/2023)  Utilities: Not At Risk (02/03/2023)  Depression (PHQ2-9): Low Risk  (11/30/2020)  Tobacco Use: Medium Risk (02/04/2023)     Readmission Risk Interventions     No data to display

## 2023-02-05 NOTE — Telephone Encounter (Signed)
Left message for pt to call to schedule the hospital follow up appt with Dr Jacqualyn Posey on Monday or Dr Blenda Mounts on Tuesday.

## 2023-02-05 NOTE — Consult Note (Signed)
[facility] for Infectious Disease    Date of Admission:  02/02/2023     Reason for Consult: Diabetic foot infection and osteomyelitis     Referring Physician: Dr Tana Coast  Current antibiotics: Cefepime Metronidazole Vancomycin  ASSESSMENT:    62 y.o. male admitted with:  Left foot first metatarsal osteomyelitis, abscess, and septic arthritis: Status post irrigation and debridement 02/03/2023 with podiatry.  Operative cultures notable for group B strep. Right foot plantar diabetic foot ulcer. Uncontrolled type 2 diabetes: Most recent hemoglobin A1c is 8.9 this admission.  RECOMMENDATIONS:    Continue antibiotics.  In the setting of osteomyelitis status post debridement but no definitive amputation would recommend 6 weeks of therapy from I&D End date of antibiotics = 03/17/2023 Transition to doxycycline and Augmentin Discussed with patient the severity of his infection and risk of future limb loss despite antibiotics Follow-up as an outpatient with myself on 02/20/2023 at 4 PM Continue wound care, glycemic control, podiatry follow-up Please call as needed   Principal Problem:   Toe osteomyelitis, left ([place]) Active Problems:   Hyperlipidemia   Generalized anxiety disorder   Type 2 diabetes mellitus with complication, with long-term current use of insulin (HCC)   Abscess of left foot   MEDICATIONS:    Scheduled Meds:  amoxicillin-clavulanate  1 tablet Oral Q12H   atorvastatin  20 mg Oral Daily   colchicine  0.6 mg Oral Daily   doxycycline  100 mg Oral Q12H   enoxaparin (LOVENOX) injection  40 mg Subcutaneous Q24H   insulin aspart  0-15 Units Subcutaneous TID WC   insulin glargine-yfgn  15 Units Subcutaneous QHS   lisinopril  2.5 mg Oral Daily   pantoprazole  40 mg Oral Daily   Continuous Infusions:  sodium chloride Stopped (02/03/23 1021)   PRN Meds:.acetaminophen **OR** acetaminophen, ALPRAZolam, morphine injection, ondansetron **OR** ondansetron (ZOFRAN) IV,  senna-docusate  HPI:    [name] is a 62 y.o. male with a past medical history of poorly controlled diabetes, hypertension, anxiety, hyperlipidemia who presented 02/02/2023 with worsening of his left diabetic foot ulcer.  He has followed with podiatry and wound care as an outpatient.  He has been treated with doxycycline.  He had ABIs done as an outpatient on 01/25/2023.  These were read as normal on the right and left, however, arterial wall calcification precluded accurate ankle pressures.  He was started on spectrum antibiotics upon admission on 3/22.  Podiatry took him to the OR on 3/23 for irrigation and debridement of his left foot with bone biopsy.  Intraoperative findings were notable for soft bone consistent with osteomyelitis.  He also had a large abscess noted.  Debridement of necrotic, nonviable tissue was undertaken.  His operative cultures from the bone are no growth at this time.  Abscess cultures are growing group B strep.  He has been stable on IV antibiotics.  We were contacted this morning as patient is really wanting to go home and get out of the hospital as soon as possible.   Past Medical History:  Diagnosis Date   Anxiety    Arthritis    Diabetes mellitus     Social History   Tobacco Use   Smoking status: Former    Types: Cigars    Quit date: 01/09/2017    Years since quitting: 6.0   Smokeless tobacco: Never  Substance Use Topics   Alcohol use: No   Drug use: No    History reviewed. No pertinent family history.  Not  on File  Review of Systems  All other systems reviewed and are negative. Except as otherwise noted above in the HPI  OBJECTIVE:   Blood pressure (!) 149/74, pulse 100, temperature 98.4 F (36.9 C), temperature source Oral, resp. rate 18, height 6' (1.829 m), weight 99.8 kg, SpO2 97 %. Body mass index is 29.84 kg/m.  Physical Exam Constitutional:      General: He is not in acute distress.    Appearance: Normal appearance.  HENT:      Head: Normocephalic and atraumatic.  Eyes:     Extraocular Movements: Extraocular movements intact.     Conjunctiva/sclera: Conjunctivae normal.  Cardiovascular:     Rate and Rhythm: Normal rate and regular rhythm.  Pulmonary:     Effort: Pulmonary effort is normal. No respiratory distress.  Abdominal:     General: There is no distension.     Palpations: Abdomen is soft.  Musculoskeletal:        General: Swelling present. Normal range of motion.     Cervical back: Normal range of motion and neck supple.  Skin:    General: Skin is warm and dry.  Neurological:     General: No focal deficit present.     Mental Status: He is alert and oriented to person, place, and time.  Psychiatric:        Mood and Affect: Mood normal.        Behavior: Behavior normal.      Lab Results: Lab Results  Component Value Date   WBC 10.3 02/05/2023   HGB 11.5 (L) 02/05/2023   HCT 36.2 (L) 02/05/2023   MCV 85.6 02/05/2023   PLT 338 02/05/2023    Lab Results  Component Value Date   NA 138 02/05/2023   K 4.1 02/05/2023   CO2 26 02/05/2023   GLUCOSE 173 (H) 02/05/2023   BUN 13 02/05/2023   CREATININE 1.06 02/05/2023   CALCIUM 9.1 02/05/2023   GFRNONAA >60 02/05/2023   GFRAA 108 04/22/2020    Lab Results  Component Value Date   ALT 18 02/02/2023   AST 18 02/02/2023   ALKPHOS 91 02/02/2023   BILITOT 0.7 02/02/2023       Component Value Date/Time   CRP 12.1 (H) 02/03/2023 0900       Component Value Date/Time   ESRSEDRATE 57 (H) 02/03/2023 0900    I have reviewed the micro and lab results in Epic.  Imaging: DG Foot Complete Right  Result Date: 02/03/2023 CLINICAL DATA:  Right foot pain, initial encounter EXAM: RIGHT FOOT COMPLETE - 3+ VIEW COMPARISON:  12/21/2022 FINDINGS: No acute fracture or dislocation is noted. No erosive changes to suggest osteomyelitis are seen. Soft tissue wound is noted near the first MTP joint. The plantar arch is accentuated and stable. IMPRESSION: Soft  tissue wound without evidence of osteomyelitis. Electronically Signed   By: Inez Catalina M.D.   On: 02/03/2023 20:07     Imaging independently reviewed in Epic.  Raynelle [facility] for Infectious [facility] [phone] pager 02/05/2023, 10:36 AM

## 2023-02-05 NOTE — Progress Notes (Signed)
Subjective:  Patient ID: [name], male    DOB: [birth year],  MRN: [id number]  Patient POD#2 s/p left foot I&D and bone biopsy. Relates doing well this morning and feeling much better. Really wanting to go home as he is worried about his mental state. Denies n/v/f/c.    Past Medical History:  Diagnosis Date   Anxiety    Arthritis    Diabetes mellitus      Past Surgical History:  Procedure Laterality Date   I & D EXTREMITY Left 02/03/2023   Procedure: IRRIGATION AND DEBRIDEMENT LEFT FOOT AND BONE BIOPSY;  Surgeon: Trula Slade, DPM;  Location: [facility];  Service: Podiatry;  Laterality: Left;   VARICOSE VEIN SURGERY     bilateral       Latest Ref Rng & Units 02/05/2023    4:53 AM 02/04/2023    4:43 AM 02/03/2023   12:44 AM  CBC  WBC 4.0 - 10.5 K/uL 10.3  13.8  14.6   Hemoglobin 13.0 - 17.0 g/dL 11.5  11.5  12.5   Hematocrit 39.0 - 52.0 % 36.2  36.4  39.1   Platelets 150 - 400 K/uL 338  396  344        Latest Ref Rng & Units 02/05/2023    4:53 AM 02/04/2023    4:43 AM 02/03/2023   12:44 AM  BMP  Glucose 70 - 99 mg/dL 173  130  141   BUN 8 - 23 mg/dL 13  18  16    Creatinine 0.61 - 1.24 mg/dL 1.06  1.07  1.03   Sodium 135 - 145 mmol/L 138  136  135   Potassium 3.5 - 5.1 mmol/L 4.1  3.8  3.5   Chloride 98 - 111 mmol/L 100  101  99   CO2 22 - 32 mmol/L 26  26  24    Calcium 8.9 - 10.3 mg/dL 9.1  8.8  9.0      Objective:   Vitals:   02/04/23 1955 02/05/23 0456  BP: (!) 157/77 (!) 149/74  Pulse: 92 100  Resp: 18 18  Temp: 98.4 F (36.9 C) 98.4 F (36.9 C)  SpO2: 97% 97%    General:AA&O x 3. Normal mood and affect   Vascular: DP and PT pulses 2/4 bilateral. Brisk capillary refill to all digits. Pedal hair present   Neruological. Epicritic sensation grossly intact.   Derm: Left hallux incision with suture intact and well coapted. Left medial arch three wounds with granular base not connection. With very minimal erythema noted to proximal wound. Does appear  improved from previous. No fluctuance noted.     MSK: MMT 5/5 in dorsiflexion, plantar flexion, inversion and eversion. Normal joint ROM without pain or crepitus.      Assessment & Plan:  Patient was evaluated and treated and all questions answered.  POD #2 s/p left foot I&D, bone biopsy; foot stable ulcer   -Dressing was changed today.  Irrigated the wound site with saline and repacked it with iodoform packing. -Clinically he is improving his white blood cell count is trending down.  He is afebrile. -As he has improved clinically and white count is trending down (10.3) Do not feel he needs to return to the OR today. NO PLAN FOR OR.  We will follow-up cultures for antibiotic recommendations.  Given the osteomyelitis would recommend infectious disease consultation for recommendations on antibiotics. -Continue current antibiotics. -Wound culture 3/23- Group B strep  -Bone culture 3/23- NGTDx1- prelim -Nonweightbearing.   -Will  need home health; dressing changes at least every other day.  Irrigate wounds with saline.  Iodoform packing and covering with Betadine around the wounds followed by 4 x 4's, Kerlix, Ace bandage. -Podiatry will continue to follow but will be ok for discharge pending ID recommendations for antibiotics. Podiatry will follow-up in one week from discharge and will set this up for him.   Lorenda Peck, DPM  Accessible via secure chat for questions or concerns.

## 2023-02-06 ENCOUNTER — Telehealth: Payer: Self-pay | Admitting: *Deleted

## 2023-02-06 NOTE — Telephone Encounter (Signed)
Patient is calling and wanted to make sure that his dressings are to stay on until upcoming appointment, explained that it should until his first post op, verbalized understanding to rest , elevate and to take pain meds as needed, to call back if any problems.

## 2023-02-06 NOTE — Telephone Encounter (Signed)
Patient is calling to ask ,is concerned about taking his diabetes medicine, was given insulin in hospital, does he start back on the prescribed Trulicity, please advise.

## 2023-02-08 ENCOUNTER — Other Ambulatory Visit: Payer: Self-pay

## 2023-02-08 LAB — AEROBIC/ANAEROBIC CULTURE W GRAM STAIN (SURGICAL/DEEP WOUND)
Culture: NO GROWTH
Gram Stain: NONE SEEN
Gram Stain: NONE SEEN

## 2023-02-08 LAB — CULTURE, BLOOD (ROUTINE X 2)
Culture: NO GROWTH
Culture: NO GROWTH
Special Requests: ADEQUATE
Special Requests: ADEQUATE

## 2023-02-08 NOTE — Telephone Encounter (Signed)
Called patient and left voice message of physician's recommendations and to call back if he is in need of a dressing change.

## 2023-02-12 ENCOUNTER — Ambulatory Visit (INDEPENDENT_AMBULATORY_CARE_PROVIDER_SITE_OTHER): Payer: 59 | Admitting: Podiatry

## 2023-02-12 VITALS — BP 186/93 | HR 96 | Temp 97.0°F

## 2023-02-12 DIAGNOSIS — Z9889 Other specified postprocedural states: Secondary | ICD-10-CM

## 2023-02-12 DIAGNOSIS — L97529 Non-pressure chronic ulcer of other part of left foot with unspecified severity: Secondary | ICD-10-CM

## 2023-02-12 MED ORDER — DOXYCYCLINE HYCLATE 100 MG PO TABS: 100.0000 mg | ORAL_TABLET | Freq: Two times a day (BID) | ORAL | 0 refills | Status: DC

## 2023-02-13 NOTE — Progress Notes (Signed)
Subjective: Chief Complaint  Patient presents with   Routine Post Op    POV # 1 / left foot and bone biopsy-dos 3.23.2024-hospital     [name] is a 62 y.o. is seen today in office s/p left foot incision and drainage, bone biopsy preformed on 02/03/2023.  Dressing has not been changed since discharge.  Denies any fevers or chills.  Still taking antibiotics per infectious disease recommendations.  Objective: General: No acute distress, AAOx3  DP/PT pulses palpable, CRT < 3 sec to all digits.  Left foot: Incision is well coapted without any evidence of dehiscence. There are 3 wounds present on the medial arch of the foot cremation tissue present there is no purulence.  Some minimal edema.  There is no significant cellulitis or ascending cellulitis.  There is no fluctuance or crepitation.  There is no malodor.  There is no surrounding erythema, ascending cellulitis, fluctuance, crepitus, malodor,  No other open lesions or pre-ulcerative lesions.  No pain with calf compression, swelling, warmth, erythema.   Assessment and Plan:  Status post left foot I&D, bone biopsy  -Treatment options discussed including all alternatives, risks, and complications -Packing was removed.  Irrigated the wound with saline.  Repacked with iodoform packing.  Discussed with dressing to be changed every other day.  Was not seen at home cares has been cannot sign the orders been placed and we will fax the referral with the dressing instructions.  If he is not able to have the dressing changes by home health care this week I like to see him back on Wednesday for dressing change in the office with a nurse. -Continue cam boot, nonweightbearing -Elevation -Continue antibiotics per infectious disease. -Discussed with any signs or symptoms of worsening infection to explore directly to the emergency room.  Discussed with patient that this is going to take some time to heal continue close monitoring for any changes to let me  know. -May need to refer to the wound care center if needed to help facilitate healing.   *X-ray next appointment   Celesta Gentile, DPM

## 2023-02-19 ENCOUNTER — Other Ambulatory Visit: Payer: 59

## 2023-02-19 ENCOUNTER — Encounter: Payer: Self-pay | Admitting: Podiatry

## 2023-02-19 ENCOUNTER — Ambulatory Visit (INDEPENDENT_AMBULATORY_CARE_PROVIDER_SITE_OTHER): Payer: 59 | Admitting: Podiatry

## 2023-02-19 DIAGNOSIS — Z9889 Other specified postprocedural states: Secondary | ICD-10-CM

## 2023-02-19 MED ORDER — TRAMADOL HCL 50 MG PO TABS: 50.0000 mg | ORAL_TABLET | Freq: Three times a day (TID) | ORAL | 0 refills | Status: DC | PRN

## 2023-02-19 NOTE — Progress Notes (Signed)
Subjective:  Patient ID: [name], male    DOB: [birth year],  MRN: [id number]  No chief complaint on file.   DOS: 02/03/23 Procedure: Left foot Irrigation and debridement and left hallux bone biopsy  62 y.o. male returns for POV#2. Relates doing well and  managing pain. Has not hcanged dressing since last Monday. Home health has not come. Relates they called but wasn't sure if he should call back.  He has continued on doxycyline and augmenti. Requesting refill of tramadol.   Review of Systems: Negative except as noted in the HPI. Denies N/V/F/Ch.  Past Medical History:  Diagnosis Date   Anxiety    Arthritis    Diabetes mellitus     Current Outpatient Medications:    ALPRAZolam (XANAX) 0.5 MG tablet, TAKE 1/2 TO 1 (ONE-HALF TO ONE) TABLET BY MOUTH AT BEDTIME AS NEEDED FOR ANXIETY OR SLEEP, Disp: 30 tablet, Rfl: 0   amoxicillin-clavulanate (AUGMENTIN) 875-125 MG tablet, Take 1 tablet by mouth 2 (two) times daily., Disp: 80 tablet, Rfl: 0   aspirin 81 MG tablet, Take 1 tablet (81 mg total) by mouth daily., Disp: 60 tablet, Rfl: 6   blood glucose meter kit and supplies KIT, Per insurance preference. Check blood glucose once a day. Dx E11.9, Z79.4, Disp: 1 each, Rfl: 11   colchicine 0.6 MG tablet, Take 0.6 mg by mouth daily., Disp: , Rfl:    doxycycline (VIBRA-TABS) 100 MG tablet, Take 1 tablet (100 mg total) by mouth 2 (two) times daily., Disp: 60 tablet, Rfl: 0   Dulaglutide (TRULICITY) 3 MG/0.5ML SOPN, Inject 3 mg into the skin once a week., Disp: , Rfl:    gabapentin (NEURONTIN) 300 MG capsule, Take 1 capsule (300 mg total) by mouth 3 (three) times daily., Disp: 90 capsule, Rfl: 3   glucose blood test strip, Test blood sugar daily. Dx code: 250.60., Disp: 100 each, Rfl: 3   insulin glargine (LANTUS SOLOSTAR) 100 UNIT/ML Solostar Pen, Inject 16 Units into the skin at bedtime. (Patient not taking: Reported on 02/02/2023), Disp: 15 mL, Rfl: 11   Insulin Pen Needle (PEN NEEDLES) 32G X 6  MM MISC, 1 each by Does not apply route at bedtime., Disp: 100 each, Rfl: 1   Lancets MISC, Test blood sugar daily. Dx code 250.60, Disp: 100 each, Rfl: 3   latanoprost (XALATAN) 0.005 % ophthalmic solution, Place 1 drop into both eyes at bedtime., Disp: , Rfl:    lisinopril (ZESTRIL) 5 MG tablet, Take 0.5 tablets (2.5 mg total) by mouth daily., Disp: 90 tablet, Rfl: 1   omeprazole (PRILOSEC) 40 MG capsule, Take 1 capsule (40 mg total) by mouth daily., Disp: 90 capsule, Rfl: 3   sildenafil (VIAGRA) 100 MG tablet, Take 100 mg by mouth as needed for erectile dysfunction., Disp: , Rfl:    traMADol (ULTRAM) 50 MG tablet, Take 1 tablet (50 mg total) by mouth every 8 (eight) hours as needed for severe pain or moderate pain., Disp: 28 tablet, Rfl: 0  Social History   Tobacco Use  Smoking Status Former   Types: Cigars   Quit date: 01/09/2017   Years since quitting: 6.1  Smokeless Tobacco Never    No Known Allergies Objective:  There were no vitals filed for this visit. There is no height or weight on file to calculate BMI. Constitutional Well developed. Well nourished.  Vascular Foot warm and well perfused. Capillary refill normal to all digits.   Neurologic Normal speech. Oriented to person, place, and time. Epicritic sensation  to light touch grossly present bilaterally.  Dermatologic Dorsal hallux inciison well healed and copated. Plantar left foot midfoot wounds about 1 cm each and proximal about 1 cm in dept and distal abou 0.5 ml in depth. No erythema edema or purulence noted.   Orthopedic: Tenderness to palpation noted about the surgical site.   Radiographs: No new osseous erosions noted.  Assessment:   1. Status post left foot surgery    Plan:  Patient was evaluated and treated and all questions answered.  S/p foot surgery left -Progressing as expected post-operatively. -WB Status: NWB in Cam boot  -Sutures: removed without incident. . -Medications: Tramadol 50 mg refilled.   -Foot redressed with packing. Discussed calling home health to get this set up.  Return next week for dressing change and evaluation if home health not able to change this week.   No follow-ups on file.

## 2023-02-20 ENCOUNTER — Ambulatory Visit: Payer: 59 | Admitting: Internal Medicine

## 2023-02-20 ENCOUNTER — Telehealth: Payer: Self-pay | Admitting: *Deleted

## 2023-02-20 NOTE — Telephone Encounter (Signed)
[name] w/ [facility],did received referral and patient requested to come out on Friday(12 th) instead of today.

## 2023-02-26 ENCOUNTER — Encounter: Payer: Self-pay | Admitting: Podiatry

## 2023-02-26 ENCOUNTER — Ambulatory Visit (INDEPENDENT_AMBULATORY_CARE_PROVIDER_SITE_OTHER): Payer: 59 | Admitting: Podiatry

## 2023-02-26 DIAGNOSIS — Z9889 Other specified postprocedural states: Secondary | ICD-10-CM

## 2023-02-26 DIAGNOSIS — L97513 Non-pressure chronic ulcer of other part of right foot with necrosis of muscle: Secondary | ICD-10-CM

## 2023-02-26 DIAGNOSIS — L97522 Non-pressure chronic ulcer of other part of left foot with fat layer exposed: Secondary | ICD-10-CM

## 2023-02-26 NOTE — Progress Notes (Signed)
Subjective:  Patient ID: [name], male    DOB: 08/21/1961,  MRN: [id number]  Chief Complaint  Patient presents with   Routine Post Op    Wound check     DOS: 02/03/23 Procedure: Left foot Irrigation and debridement and left hallux bone biopsy  62 y.o. male returns for POV#3. Relates doing well and  managing pain. Has not changed dressing since last Monday. Home health has not come. Relates they called but wasn't sure if he needed them because he wanted to try changing himself but could not.  He has continued on doxycyline and augmentin. Relates he has been keeping neosporin and bandaid on right foot wound.   Review of Systems: Negative except as noted in the HPI. Denies N/V/F/Ch.  Past Medical History:  Diagnosis Date   Anxiety    Arthritis    Diabetes mellitus     Current Outpatient Medications:    ALPRAZolam (XANAX) 0.5 MG tablet, TAKE 1/2 TO 1 (ONE-HALF TO ONE) TABLET BY MOUTH AT BEDTIME AS NEEDED FOR ANXIETY OR SLEEP, Disp: 30 tablet, Rfl: 0   amoxicillin-clavulanate (AUGMENTIN) 875-125 MG tablet, Take 1 tablet by mouth 2 (two) times daily., Disp: 80 tablet, Rfl: 0   aspirin 81 MG tablet, Take 1 tablet (81 mg total) by mouth daily., Disp: 60 tablet, Rfl: 6   blood glucose meter kit and supplies KIT, Per insurance preference. Check blood glucose once a day. Dx E11.9, Z79.4, Disp: 1 each, Rfl: 11   colchicine 0.6 MG tablet, Take 0.6 mg by mouth daily., Disp: , Rfl:    doxycycline (VIBRA-TABS) 100 MG tablet, Take 1 tablet (100 mg total) by mouth 2 (two) times daily., Disp: 60 tablet, Rfl: 0   Dulaglutide (TRULICITY) 3 MG/0.5ML SOPN, Inject 3 mg into the skin once a week., Disp: , Rfl:    gabapentin (NEURONTIN) 300 MG capsule, Take 1 capsule (300 mg total) by mouth 3 (three) times daily., Disp: 90 capsule, Rfl: 3   glucose blood test strip, Test blood sugar daily. Dx code: 250.60., Disp: 100 each, Rfl: 3   insulin glargine (LANTUS SOLOSTAR) 100 UNIT/ML Solostar Pen, Inject 16  Units into the skin at bedtime. (Patient not taking: Reported on 02/02/2023), Disp: 15 mL, Rfl: 11   Insulin Pen Needle (PEN NEEDLES) 32G X 6 MM MISC, 1 each by Does not apply route at bedtime., Disp: 100 each, Rfl: 1   Lancets MISC, Test blood sugar daily. Dx code 250.60, Disp: 100 each, Rfl: 3   latanoprost (XALATAN) 0.005 % ophthalmic solution, Place 1 drop into both eyes at bedtime., Disp: , Rfl:    lisinopril (ZESTRIL) 5 MG tablet, Take 0.5 tablets (2.5 mg total) by mouth daily., Disp: 90 tablet, Rfl: 1   omeprazole (PRILOSEC) 40 MG capsule, Take 1 capsule (40 mg total) by mouth daily., Disp: 90 capsule, Rfl: 3   sildenafil (VIAGRA) 100 MG tablet, Take 100 mg by mouth as needed for erectile dysfunction., Disp: , Rfl:    traMADol (ULTRAM) 50 MG tablet, Take 1 tablet (50 mg total) by mouth every 8 (eight) hours as needed for severe pain or moderate pain., Disp: 28 tablet, Rfl: 0  Social History   Tobacco Use  Smoking Status Former   Types: Cigars   Quit date: 01/09/2017   Years since quitting: 6.1  Smokeless Tobacco Never    No Known Allergies Objective:  There were no vitals filed for this visit. There is no height or weight on file to calculate BMI. Constitutional  Well developed. Well nourished.  Vascular Foot warm and well perfused. Capillary refill normal to all digits.   Neurologic Normal speech. Oriented to person, place, and time. Epicritic sensation to light touch grossly present bilaterally.  Dermatologic Dorsal hallux inciison well healed and copated. Plantar left foot midfoot wounds about 1 cm each and proximal about 1 cm in dept and distal abou 0.5 ml in depth. No erythema edema or purulence noted.  Plantar right foot wound about 1 cm x 0.5 cm x 0.4 cm No probe to bone . No erythema edema or purulence noted.    Orthopedic: Tenderness to palpation noted about the surgical site.   Radiographs: No new osseous erosions noted.  Assessment:   No diagnosis found.  Plan:   Patient was evaluated and treated and all questions answered.  S/p foot surgery left -Progressing as expected post-operatively. -WB Status: NWB in Cam boot  -Sutures: removed without incident. . -Medications: Tramadol 50 mg refilled.  -Foot redressed with packing. Discussed calling home health to get this set up.  Return next week for dressing change and evaluation if home health not able to change this week.   Ulcer right foot wound  -Debridement as below. -Dressed with betadine, DSD. -Off-loading with surgical shoe.  -Discussed glucose control and proper protein-rich diet.  -Discussed if any worsening redness, pain, fever or chills to call or may need to report to the emergency room. Patient expressed understanding.   Procedure: Excisional Debridement of Wound Rationale: Removal of non-viable soft tissue from the wound to promote healing.  Anesthesia: none Pre-Debridement Wound Measurements: Overlying callus  Post-Debridement Wound Measurements: 1 cm x 0.5 cm x 0.4 cm  Type of Debridement: Sharp Excisional Tissue Removed: Non-viable soft tissue Depth of Debridement: subcutaneous tissue. Technique: Sharp excisional debridement to bleeding, viable wound base.  Dressing: Dry, sterile, compression dressing. Disposition: Patient tolerated procedure well. Patient to return in 2 week for follow-up.  Return in about 2 weeks (around 03/12/2023) for post op, wound check.   Return in about 2 weeks (around 03/12/2023) for post op, wound check.

## 2023-03-08 ENCOUNTER — Other Ambulatory Visit: Payer: Self-pay | Admitting: Podiatry

## 2023-03-12 ENCOUNTER — Encounter: Payer: Self-pay | Admitting: Podiatry

## 2023-03-12 ENCOUNTER — Ambulatory Visit (INDEPENDENT_AMBULATORY_CARE_PROVIDER_SITE_OTHER): Payer: 59 | Admitting: Podiatry

## 2023-03-12 DIAGNOSIS — E1142 Type 2 diabetes mellitus with diabetic polyneuropathy: Secondary | ICD-10-CM | POA: Diagnosis not present

## 2023-03-12 DIAGNOSIS — I739 Peripheral vascular disease, unspecified: Secondary | ICD-10-CM

## 2023-03-12 DIAGNOSIS — Z9889 Other specified postprocedural states: Secondary | ICD-10-CM

## 2023-03-12 DIAGNOSIS — L97513 Non-pressure chronic ulcer of other part of right foot with necrosis of muscle: Secondary | ICD-10-CM | POA: Diagnosis not present

## 2023-03-12 MED ORDER — TRAMADOL HCL 50 MG PO TABS: 50.0000 mg | ORAL_TABLET | Freq: Four times a day (QID) | ORAL | 0 refills | Status: AC | PRN

## 2023-03-12 NOTE — Progress Notes (Signed)
Subjective:  Patient ID: [name], male    DOB: 11-Aug-1961,  MRN: [id number]  No chief complaint on file.   DOS: 02/03/23 Procedure: Left foot Irrigation and debridement and left hallux bone biopsy  62 y.o. male returns for POV#4. Relates doing well and  managing pain. Has not changed dressing since last Monday. Home health has not come. Patient has been refusing service and not answering called. He has continued on doxycyline and augmentin. Relates he has been keeping neosporin and bandaid on right foot wound.   Review of Systems: Negative except as noted in the HPI. Denies N/V/F/Ch.  Past Medical History:  Diagnosis Date   Anxiety    Arthritis    Diabetes mellitus     Current Outpatient Medications:    ALPRAZolam (XANAX) 0.5 MG tablet, TAKE 1/2 TO 1 (ONE-HALF TO ONE) TABLET BY MOUTH AT BEDTIME AS NEEDED FOR ANXIETY OR SLEEP, Disp: 30 tablet, Rfl: 0   amoxicillin-clavulanate (AUGMENTIN) 875-125 MG tablet, Take 1 tablet by mouth 2 (two) times daily., Disp: 80 tablet, Rfl: 0   aspirin 81 MG tablet, Take 1 tablet (81 mg total) by mouth daily., Disp: 60 tablet, Rfl: 6   blood glucose meter kit and supplies KIT, Per insurance preference. Check blood glucose once a day. Dx E11.9, Z79.4, Disp: 1 each, Rfl: 11   colchicine 0.6 MG tablet, Take 0.6 mg by mouth daily., Disp: , Rfl:    doxycycline (VIBRA-TABS) 100 MG tablet, Take 1 tablet (100 mg total) by mouth 2 (two) times daily., Disp: 60 tablet, Rfl: 0   Dulaglutide (TRULICITY) 3 MG/0.5ML SOPN, Inject 3 mg into the skin once a week., Disp: , Rfl:    gabapentin (NEURONTIN) 300 MG capsule, Take 1 capsule (300 mg total) by mouth 3 (three) times daily., Disp: 90 capsule, Rfl: 3   glucose blood test strip, Test blood sugar daily. Dx code: 250.60., Disp: 100 each, Rfl: 3   insulin glargine (LANTUS SOLOSTAR) 100 UNIT/ML Solostar Pen, Inject 16 Units into the skin at bedtime. (Patient not taking: Reported on 02/02/2023), Disp: 15 mL, Rfl: 11    Insulin Pen Needle (PEN NEEDLES) 32G X 6 MM MISC, 1 each by Does not apply route at bedtime., Disp: 100 each, Rfl: 1   Lancets MISC, Test blood sugar daily. Dx code 250.60, Disp: 100 each, Rfl: 3   latanoprost (XALATAN) 0.005 % ophthalmic solution, Place 1 drop into both eyes at bedtime., Disp: , Rfl:    lisinopril (ZESTRIL) 5 MG tablet, Take 0.5 tablets (2.5 mg total) by mouth daily., Disp: 90 tablet, Rfl: 1   omeprazole (PRILOSEC) 40 MG capsule, Take 1 capsule (40 mg total) by mouth daily., Disp: 90 capsule, Rfl: 3   sildenafil (VIAGRA) 100 MG tablet, Take 100 mg by mouth as needed for erectile dysfunction., Disp: , Rfl:    traMADol (ULTRAM) 50 MG tablet, Take 1 tablet (50 mg total) by mouth every 6 (six) hours as needed for up to 7 days., Disp: 28 tablet, Rfl: 0  Social History   Tobacco Use  Smoking Status Former   Types: Cigars   Quit date: 01/09/2017   Years since quitting: 6.1  Smokeless Tobacco Never    No Known Allergies Objective:  There were no vitals filed for this visit. There is no height or weight on file to calculate BMI. Constitutional Well developed. Well nourished.  Vascular Foot warm and well perfused. Capillary refill normal to all digits.   Neurologic Normal speech. Oriented to person, place,  and time. Epicritic sensation to light touch grossly present bilaterally.  Dermatologic Dorsal hallux inciison well healed and copated. Plantar left foot midfoot wounds nearly healed. No erythema edema or purulence noted.  Plantar right foot wound about 1 cm x 0.4 cm x0.3 cm with granular base and surrounding hyperkeratosis.  No probe to bone . No erythema edema or purulence noted.    Orthopedic: Tenderness to palpation noted about the surgical site.   Radiographs: No new osseous erosions noted.  Assessment:   1. Status post left foot surgery   2. 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 PAD (peripheral artery disease) (HCC)   4. DM type 2 with diabetic  peripheral neuropathy (HCC)     Plan:  Patient was evaluated and treated and all questions answered.  S/p foot surgery left -Progressing as expected post-operatively. -WB Status: heel WBAT in surgical shoe.  -Sutures: removed without incident. . -Medications: Tramadol 50 mg refilled.  -Foot redressed without packing. Can continue dressing himself at home.    Ulcer right foot wound  -Debridement as below. -Dressed with betadine, DSD. -Off-loading with surgical shoe.  -Discussed glucose control and proper protein-rich diet.  -Discussed if any worsening redness, pain, fever or chills to call or may need to report to the emergency room. Patient expressed understanding.   Procedure: Excisional Debridement of Wound Rationale: Removal of non-viable soft tissue from the wound to promote healing.  Anesthesia: none Pre-Debridement Wound Measurements: Overlying callus  Post-Debridement Wound Measurements: 1 cm x 0.4 cm x 0.4 cm  Type of Debridement: Sharp Excisional Tissue Removed: Non-viable soft tissue Depth of Debridement: subcutaneous tissue. Technique: Sharp excisional debridement to bleeding, viable wound base.  Dressing: Dry, sterile, compression dressing. Disposition: Patient tolerated procedure well. Patient to return in 2 week for follow-up.  Return in about 2 weeks (around 03/26/2023) for wound check.   Return in about 2 weeks (around 03/26/2023) for wound check.

## 2023-03-17 ENCOUNTER — Other Ambulatory Visit: Payer: Self-pay

## 2023-03-20 ENCOUNTER — Telehealth: Payer: Self-pay | Admitting: Podiatry

## 2023-03-20 ENCOUNTER — Other Ambulatory Visit (HOSPITAL_COMMUNITY): Payer: Self-pay

## 2023-03-20 NOTE — Telephone Encounter (Signed)
Pt stated that he would like refills on the Rxs, he was prescribed after his Sx. He stated that he has noticed some swelling that has started since he is no longer taking the Rx. Please advise

## 2023-03-20 NOTE — Telephone Encounter (Signed)
Patient is calling to request a refill on doxycycline,tramadol,and the amoxicillin. His foot has started to swell and some pain after finishing last dosages of medication 2 days ago but is looking some better.

## 2023-03-21 ENCOUNTER — Other Ambulatory Visit: Payer: Self-pay | Admitting: Podiatry

## 2023-03-21 MED ORDER — TRAMADOL HCL 50 MG PO TABS: 50.0000 mg | ORAL_TABLET | Freq: Three times a day (TID) | ORAL | 0 refills | Status: AC | PRN

## 2023-03-26 ENCOUNTER — Encounter: Payer: Self-pay | Admitting: Podiatry

## 2023-03-26 ENCOUNTER — Ambulatory Visit (INDEPENDENT_AMBULATORY_CARE_PROVIDER_SITE_OTHER): Payer: 59 | Admitting: Podiatry

## 2023-03-26 DIAGNOSIS — L97512 Non-pressure chronic ulcer of other part of right foot with fat layer exposed: Secondary | ICD-10-CM | POA: Diagnosis not present

## 2023-03-26 DIAGNOSIS — Z9889 Other specified postprocedural states: Secondary | ICD-10-CM

## 2023-03-26 DIAGNOSIS — E1142 Type 2 diabetes mellitus with diabetic polyneuropathy: Secondary | ICD-10-CM

## 2023-03-26 DIAGNOSIS — I739 Peripheral vascular disease, unspecified: Secondary | ICD-10-CM

## 2023-03-26 NOTE — Progress Notes (Signed)
Subjective:  Patient ID: [name], male    DOB: 11-24-1960,  MRN: [id number]  Chief Complaint  Patient presents with   Wound Check    Bilateral feet, no drainage, swelling on surgical foot( left foot), pain located near the arch, patient out of medication and would like a refill    DOS: 02/03/23 Procedure: Left foot Irrigation and debridement and left hallux bone biopsy  62 y.o. male returns for POV#4. Relates doing well and  managing pain. . Finished antibiotics.  Relates he has been keeping neosporin and bandaid on right foot wound.   Review of Systems: Negative except as noted in the HPI. Denies N/V/F/Ch.  Past Medical History:  Diagnosis Date   Anxiety    Arthritis    Diabetes mellitus     Current Outpatient Medications:    ALPRAZolam (XANAX) 0.5 MG tablet, TAKE 1/2 TO 1 (ONE-HALF TO ONE) TABLET BY MOUTH AT BEDTIME AS NEEDED FOR ANXIETY OR SLEEP, Disp: 30 tablet, Rfl: 0   aspirin 81 MG tablet, Take 1 tablet (81 mg total) by mouth daily., Disp: 60 tablet, Rfl: 6   blood glucose meter kit and supplies KIT, Per insurance preference. Check blood glucose once a day. Dx E11.9, Z79.4, Disp: 1 each, Rfl: 11   colchicine 0.6 MG tablet, Take 0.6 mg by mouth daily., Disp: , Rfl:    doxycycline (VIBRA-TABS) 100 MG tablet, Take 1 tablet (100 mg total) by mouth 2 (two) times daily., Disp: 60 tablet, Rfl: 0   Dulaglutide (TRULICITY) 3 MG/0.5ML SOPN, Inject 3 mg into the skin once a week., Disp: , Rfl:    gabapentin (NEURONTIN) 300 MG capsule, Take 1 capsule (300 mg total) by mouth 3 (three) times daily., Disp: 90 capsule, Rfl: 3   glucose blood test strip, Test blood sugar daily. Dx code: 250.60., Disp: 100 each, Rfl: 3   insulin glargine (LANTUS SOLOSTAR) 100 UNIT/ML Solostar Pen, Inject 16 Units into the skin at bedtime. (Patient not taking: Reported on 02/02/2023), Disp: 15 mL, Rfl: 11   Insulin Pen Needle (PEN NEEDLES) 32G X 6 MM MISC, 1 each by Does not apply route at bedtime., Disp:  100 each, Rfl: 1   Lancets MISC, Test blood sugar daily. Dx code 250.60, Disp: 100 each, Rfl: 3   latanoprost (XALATAN) 0.005 % ophthalmic solution, Place 1 drop into both eyes at bedtime., Disp: , Rfl:    lisinopril (ZESTRIL) 5 MG tablet, Take 0.5 tablets (2.5 mg total) by mouth daily., Disp: 90 tablet, Rfl: 1   omeprazole (PRILOSEC) 40 MG capsule, Take 1 capsule (40 mg total) by mouth daily., Disp: 90 capsule, Rfl: 3   sildenafil (VIAGRA) 100 MG tablet, Take 100 mg by mouth as needed for erectile dysfunction., Disp: , Rfl:    traMADol (ULTRAM) 50 MG tablet, Take 1 tablet (50 mg total) by mouth every 8 (eight) hours as needed for up to 5 days., Disp: 15 tablet, Rfl: 0  Social History   Tobacco Use  Smoking Status Former   Types: Cigars   Quit date: 01/09/2017   Years since quitting: 6.2  Smokeless Tobacco Never    No Known Allergies Objective:  There were no vitals filed for this visit. There is no height or weight on file to calculate BMI. Constitutional Well developed. Well nourished.  Vascular Foot warm and well perfused. Capillary refill normal to all digits.   Neurologic Normal speech. Oriented to person, place, and time. Epicritic sensation to light touch grossly present bilaterally.  Dermatologic  Dorsal hallux inciison well healed and copated. Plantar left foot midfoot wounds nearly healed. No erythema edema or purulence noted.  Plantar right foot wound about 0.5 cm x 0.4 cm x0.3 cm with granular base and surrounding hyperkeratosis.  No probe to bone . No erythema edema or purulence noted.    Orthopedic: Tenderness to palpation noted about the surgical site.   Radiographs: No new osseous erosions noted.  Assessment:   1. Ulcer of right foot with fat layer exposed (HCC)   2. PAD (peripheral artery disease) (HCC)   3. DM type 2 with diabetic peripheral neuropathy (HCC)   4. Status post left foot surgery      Plan:  Patient was evaluated and treated and all questions  answered.  S/p foot surgery left -Progressing as expected post-operatively. -WB Status: heel WBAT in surgical shoe.  -Medications: Tramadol 50 mg refilled.  -Wound healed.    Ulcer right foot wound  -Debridement as below. -Dressed with betadine, DSD. -Off-loading with surgical shoe.  -Discussed glucose control and proper protein-rich diet.  -Discussed if any worsening redness, pain, fever or chills to call or may need to report to the emergency room. Patient expressed understanding.   Procedure: Excisional Debridement of Wound Rationale: Removal of non-viable soft tissue from the wound to promote healing.  Anesthesia: none Pre-Debridement Wound Measurements: Overlying callus  Post-Debridement Wound Measurements: 0.5 cm x 0.4 cm x 0.3 cm  Type of Debridement: Sharp Excisional Tissue Removed: Non-viable soft tissue Depth of Debridement: subcutaneous tissue. Technique: Sharp excisional debridement to bleeding, viable wound base.  Dressing: Dry, sterile, compression dressing. Disposition: Patient tolerated procedure well. Patient to return in 2 week for follow-up.  No follow-ups on file.   No follow-ups on file.

## 2023-03-27 ENCOUNTER — Other Ambulatory Visit (HOSPITAL_COMMUNITY): Payer: Self-pay

## 2023-03-29 ENCOUNTER — Telehealth: Payer: Self-pay | Admitting: Podiatry

## 2023-03-29 NOTE — Telephone Encounter (Signed)
Patient is wanting to let the physician know that he will be needing medication, foot is still swollen,not able to wear regular shoes. Patient is having a lot of soreness,is constantly elevating,does he need a referral to infectious disease,stating that he is having a difficult time getting around because of the swelling and pain. Please advise.

## 2023-03-29 NOTE — Telephone Encounter (Signed)
Pt called and was returning your call Mrs [name] I did not see a note in chart for pt.

## 2023-04-16 ENCOUNTER — Ambulatory Visit (INDEPENDENT_AMBULATORY_CARE_PROVIDER_SITE_OTHER): Payer: 59 | Admitting: Podiatry

## 2023-04-16 ENCOUNTER — Encounter: Payer: Self-pay | Admitting: Podiatry

## 2023-04-16 DIAGNOSIS — E1142 Type 2 diabetes mellitus with diabetic polyneuropathy: Secondary | ICD-10-CM

## 2023-04-16 DIAGNOSIS — Z87898 Personal history of other specified conditions: Secondary | ICD-10-CM

## 2023-04-16 DIAGNOSIS — I739 Peripheral vascular disease, unspecified: Secondary | ICD-10-CM

## 2023-04-16 DIAGNOSIS — M722 Plantar fascial fibromatosis: Secondary | ICD-10-CM | POA: Diagnosis not present

## 2023-04-16 DIAGNOSIS — Z9889 Other specified postprocedural states: Secondary | ICD-10-CM

## 2023-04-16 NOTE — Patient Instructions (Signed)

## 2023-04-16 NOTE — Progress Notes (Signed)
Subjective:  Patient ID: [name], male    DOB: 10-25-1961,  MRN: [id number]  Chief Complaint  Patient presents with   Foot Ulcer    Follow up ulcer plantar right foot   "It still hurts pretty bad"    DOS: 02/03/23 Procedure: Left foot Irrigation and debridement and left hallux bone biopsy  62 y.o. male returns for follow-up of right foot wound.  Relates doing well and  managing pain. . Finished antibiotics.  Relates he has been keeping neosporin and bandaid on right foot wound. Left foot healed. Relates heel pain in left foot.   Review of Systems: Negative except as noted in the HPI. Denies N/V/F/Ch.  Past Medical History:  Diagnosis Date   Anxiety    Arthritis    Diabetes mellitus     Current Outpatient Medications:    ALPRAZolam (XANAX) 0.5 MG tablet, TAKE 1/2 TO 1 (ONE-HALF TO ONE) TABLET BY MOUTH AT BEDTIME AS NEEDED FOR ANXIETY OR SLEEP, Disp: 30 tablet, Rfl: 0   aspirin 81 MG tablet, Take 1 tablet (81 mg total) by mouth daily., Disp: 60 tablet, Rfl: 6   blood glucose meter kit and supplies KIT, Per insurance preference. Check blood glucose once a day. Dx E11.9, Z79.4, Disp: 1 each, Rfl: 11   colchicine 0.6 MG tablet, Take 0.6 mg by mouth daily., Disp: , Rfl:    doxycycline (VIBRA-TABS) 100 MG tablet, Take 1 tablet (100 mg total) by mouth 2 (two) times daily., Disp: 60 tablet, Rfl: 0   Dulaglutide (TRULICITY) 3 MG/0.5ML SOPN, Inject 3 mg into the skin once a week., Disp: , Rfl:    gabapentin (NEURONTIN) 300 MG capsule, Take 1 capsule (300 mg total) by mouth 3 (three) times daily., Disp: 90 capsule, Rfl: 3   glucose blood test strip, Test blood sugar daily. Dx code: 250.60., Disp: 100 each, Rfl: 3   insulin glargine (LANTUS SOLOSTAR) 100 UNIT/ML Solostar Pen, Inject 16 Units into the skin at bedtime. (Patient not taking: Reported on 02/02/2023), Disp: 15 mL, Rfl: 11   Insulin Pen Needle (PEN NEEDLES) 32G X 6 MM MISC, 1 each by Does not apply route at bedtime., Disp: 100 each,  Rfl: 1   Lancets MISC, Test blood sugar daily. Dx code 250.60, Disp: 100 each, Rfl: 3   latanoprost (XALATAN) 0.005 % ophthalmic solution, Place 1 drop into both eyes at bedtime., Disp: , Rfl:    lisinopril (ZESTRIL) 5 MG tablet, Take 0.5 tablets (2.5 mg total) by mouth daily., Disp: 90 tablet, Rfl: 1   omeprazole (PRILOSEC) 40 MG capsule, Take 1 capsule (40 mg total) by mouth daily., Disp: 90 capsule, Rfl: 3   sildenafil (VIAGRA) 100 MG tablet, Take 100 mg by mouth as needed for erectile dysfunction., Disp: , Rfl:    traMADol (ULTRAM) 50 MG tablet, Take by mouth., Disp: , Rfl:   Social History   Tobacco Use  Smoking Status Former   Types: Cigars   Quit date: 01/09/2017   Years since quitting: 6.2  Smokeless Tobacco Never    No Known Allergies Objective:  There were no vitals filed for this visit. There is no height or weight on file to calculate BMI. Constitutional Well developed. Well nourished.  Vascular Foot warm and well perfused. Capillary refill normal to all digits.   Neurologic Normal speech. Oriented to person, place, and time. Epicritic sensation to light touch grossly present bilaterally.  Dermatologic Dorsal hallux inciison well healed and copated. Plantar left foot midfoot wounds nearly healed.  No erythema edema or purulence noted.  Plantar right foot wound healed.   Orthopedic: Tenderness to palpation noted about the surgical site.Tender to the medial calcaneal tubercle on the right and along arch. No pain with calcaneal squeeze.    Radiographs: No new osseous erosions noted.  Assessment:   1. DM type 2 with diabetic peripheral neuropathy (HCC)   2. PAD (peripheral artery disease) (HCC)   3. Status post left foot surgery   4. Plantar fasciitis, left   5. History of ulceration       Plan:  Patient was evaluated and treated and all questions answered.  S/p foot surgery left -Progressing as expected post-operatively. -WB Status: heel WBAT in surgical shoe.   -Wound healed.    Ulcer right foot wound -healed.  -Debridement as below. -Dressed with betadine, DSD. -May transition out of the surgical shoes.  -Discussed getting fitted for DM shoes.  -Discussed possible plantar fasciiitis contributing to heel pain.  Will try PF brace and stretching to see if this helps.  -Discussed glucose control and proper protein-rich diet.  -Discussed if any worsening redness, pain, fever or chills to call or may need to report to the emergency room. Patient expressed understanding.     Return in about 4 weeks (around 05/14/2023) for wound check. And rfc   Return in about 4 weeks (around 05/14/2023) for wound check.

## 2023-04-19 ENCOUNTER — Other Ambulatory Visit (HOSPITAL_COMMUNITY): Payer: Self-pay

## 2023-05-14 ENCOUNTER — Encounter: Payer: Self-pay | Admitting: Podiatry

## 2023-05-14 ENCOUNTER — Ambulatory Visit (INDEPENDENT_AMBULATORY_CARE_PROVIDER_SITE_OTHER): Payer: 59 | Admitting: Podiatry

## 2023-05-14 DIAGNOSIS — B351 Tinea unguium: Secondary | ICD-10-CM | POA: Diagnosis not present

## 2023-05-14 DIAGNOSIS — I739 Peripheral vascular disease, unspecified: Secondary | ICD-10-CM

## 2023-05-14 DIAGNOSIS — M79675 Pain in left toe(s): Secondary | ICD-10-CM

## 2023-05-14 DIAGNOSIS — M79674 Pain in right toe(s): Secondary | ICD-10-CM

## 2023-05-14 DIAGNOSIS — E1142 Type 2 diabetes mellitus with diabetic polyneuropathy: Secondary | ICD-10-CM | POA: Diagnosis not present

## 2023-05-14 DIAGNOSIS — L84 Corns and callosities: Secondary | ICD-10-CM | POA: Diagnosis not present

## 2023-05-14 NOTE — Progress Notes (Signed)
Subjective:  Patient ID: [name], male    DOB: [birth year],  MRN: [id number]  Chief Complaint  Patient presents with   Wound Check    Wound check    Patient presents for concern of thickened elongated and painful nails that are difficult to trim. Requesting to have them trimmed today. Also calluses to trim. History of bilateral feet infectiosn and wounds.  Relates burning and tingling in their feet. Patient is diabetic and last A1c was  Lab Results  Component Value Date   HGBA1C 8.9 (H) 02/03/2023   .   PCP:  Patient, [name]     Review of Systems: Negative except as noted in the HPI. Denies N/V/F/Ch.  Past Medical History:  Diagnosis Date   Anxiety    Arthritis    Diabetes mellitus     Current Outpatient Medications:    ALPRAZolam (XANAX) 0.5 MG tablet, TAKE 1/2 TO 1 (ONE-HALF TO ONE) TABLET BY MOUTH AT BEDTIME AS NEEDED FOR ANXIETY OR SLEEP, Disp: 30 tablet, Rfl: 0   aspirin 81 MG tablet, Take 1 tablet (81 mg total) by mouth daily., Disp: 60 tablet, Rfl: 6   blood glucose meter kit and supplies KIT, Per insurance preference. Check blood glucose once a day. Dx E11.9, Z79.4, Disp: 1 each, Rfl: 11   colchicine 0.6 MG tablet, Take 0.6 mg by mouth daily., Disp: , Rfl:    doxycycline (VIBRA-TABS) 100 MG tablet, Take 1 tablet (100 mg total) by mouth 2 (two) times daily., Disp: 60 tablet, Rfl: 0   Dulaglutide (TRULICITY) 3 MG/0.5ML SOPN, Inject 3 mg into the skin once a week., Disp: , Rfl:    gabapentin (NEURONTIN) 300 MG capsule, Take 1 capsule (300 mg total) by mouth 3 (three) times daily., Disp: 90 capsule, Rfl: 3   glucose blood test strip, Test blood sugar daily. Dx code: 250.60., Disp: 100 each, Rfl: 3   insulin glargine (LANTUS SOLOSTAR) 100 UNIT/ML Solostar Pen, Inject 16 Units into the skin at bedtime. (Patient not taking: Reported on 02/02/2023), Disp: 15 mL, Rfl: 11   Insulin Pen Needle (PEN NEEDLES) 32G X 6 MM MISC, 1 each by Does not apply route at bedtime., Disp:  100 each, Rfl: 1   Lancets MISC, Test blood sugar daily. Dx code 250.60, Disp: 100 each, Rfl: 3   latanoprost (XALATAN) 0.005 % ophthalmic solution, Place 1 drop into both eyes at bedtime., Disp: , Rfl:    lisinopril (ZESTRIL) 5 MG tablet, Take 0.5 tablets (2.5 mg total) by mouth daily., Disp: 90 tablet, Rfl: 1   omeprazole (PRILOSEC) 40 MG capsule, Take 1 capsule (40 mg total) by mouth daily., Disp: 90 capsule, Rfl: 3   sildenafil (VIAGRA) 100 MG tablet, Take 100 mg by mouth as needed for erectile dysfunction., Disp: , Rfl:    traMADol (ULTRAM) 50 MG tablet, Take by mouth., Disp: , Rfl:   Social History   Tobacco Use  Smoking Status Former   Types: Cigars   Quit date: 01/09/2017   Years since quitting: 6.3  Smokeless Tobacco Never    No Known Allergies Objective:  Vascular: DP/PT pulses 2/4 bilateral. CFT <3 seconds. Absent hair growth on digits. Edema noted to bilateral lower extremities. Xerosis noted bilaterally.  Skin. No lacerations or abrasions bilateral feet. Nails 1-5 bilateral  are thickened discolored and elongated with subungual debris. Hyperkeratotic lesion and previous ulceration sub third metatarsal on right. No open lesions noted.  Musculoskeletal: MMT 5/5 bilateral lower extremities in DF, PF, Inversion  and Eversion. Deceased ROM in DF of ankle joint.  Neurological: Sensation intact to light touch. Protective sensation diminished bilateral.   Assessment:   1. DM type 2 with diabetic peripheral neuropathy (HCC)   2. PAD (peripheral artery disease) (HCC)   3. Pain due to onychomycosis of toenails of both feet   4. Pre-ulcerative calluses        Plan:  Patient was evaluated and treated and all questions answered. -Discussed and educated patient on diabetic foot care, especially with  regards to the vascular, neurological and musculoskeletal systems.  -Stressed the importance of good glycemic control and the detriment of not  controlling glucose levels in relation  to the foot. -Discussed supportive shoes at all times and checking feet regularly.  -Mechanically debrided all nails 1-5 bilateral using sterile nail nipper and filed with dremel without incident  -Hyperkeratotic tissue debrided without incident with chisel.  -Answered all patient questions -Patient to return  in 3 months for at risk foot care -Patient advised to call the office if any problems or questions arise in the meantime.    Return in about 3 months (around 08/14/2023) for rfc.    Return in about 3 months (around 08/14/2023) for rfc.

## 2023-07-07 ENCOUNTER — Other Ambulatory Visit: Payer: Self-pay | Admitting: Podiatry

## 2023-07-23 ENCOUNTER — Telehealth: Payer: Self-pay | Admitting: Podiatry

## 2023-07-23 ENCOUNTER — Other Ambulatory Visit: Payer: Self-pay | Admitting: Podiatry

## 2023-07-23 MED ORDER — CEPHALEXIN 500 MG PO CAPS: 500.0000 mg | ORAL_CAPSULE | Freq: Three times a day (TID) | ORAL | 2 refills | Status: DC

## 2023-07-23 NOTE — Telephone Encounter (Signed)
Toe is having reactionon left surgical foot looks like sausage. Pt would like you to call him. He is very concerned and was on hold due to short staff. I did apologize and he started to yell and I did ask him to please do not yell at me and he calmed down. He is just worried about his foot.

## 2023-07-25 ENCOUNTER — Telehealth: Payer: Self-pay | Admitting: Podiatry

## 2023-07-25 NOTE — Telephone Encounter (Signed)
Pt called and wants a call back to give you an update on the medication you gave him.

## 2023-08-14 ENCOUNTER — Other Ambulatory Visit: Payer: Self-pay | Admitting: Podiatry

## 2023-08-15 ENCOUNTER — Ambulatory Visit (INDEPENDENT_AMBULATORY_CARE_PROVIDER_SITE_OTHER): Payer: 59

## 2023-08-15 ENCOUNTER — Ambulatory Visit (INDEPENDENT_AMBULATORY_CARE_PROVIDER_SITE_OTHER): Payer: 59 | Admitting: Podiatry

## 2023-08-15 ENCOUNTER — Encounter: Payer: Self-pay | Admitting: Podiatry

## 2023-08-15 DIAGNOSIS — M79672 Pain in left foot: Secondary | ICD-10-CM

## 2023-08-15 DIAGNOSIS — L84 Corns and callosities: Secondary | ICD-10-CM

## 2023-08-15 DIAGNOSIS — E11621 Type 2 diabetes mellitus with foot ulcer: Secondary | ICD-10-CM | POA: Diagnosis not present

## 2023-08-15 DIAGNOSIS — L97522 Non-pressure chronic ulcer of other part of left foot with fat layer exposed: Secondary | ICD-10-CM

## 2023-08-15 DIAGNOSIS — E1142 Type 2 diabetes mellitus with diabetic polyneuropathy: Secondary | ICD-10-CM

## 2023-08-15 DIAGNOSIS — I739 Peripheral vascular disease, unspecified: Secondary | ICD-10-CM

## 2023-08-15 MED ORDER — CEPHALEXIN 500 MG PO CAPS: 500.0000 mg | ORAL_CAPSULE | Freq: Three times a day (TID) | ORAL | 2 refills | Status: AC

## 2023-08-15 MED ORDER — L-METHYLFOLATE-B6-B12 3-35-2 MG PO TABS: 1.0000 | ORAL_TABLET | Freq: Every day | ORAL | Status: AC

## 2023-08-15 MED ORDER — DOXYCYCLINE HYCLATE 100 MG PO TABS: 100.0000 mg | ORAL_TABLET | Freq: Two times a day (BID) | ORAL | 0 refills | Status: DC

## 2023-08-15 MED ORDER — GABAPENTIN 300 MG PO CAPS: 300.0000 mg | ORAL_CAPSULE | Freq: Two times a day (BID) | ORAL | 3 refills | Status: AC

## 2023-08-15 NOTE — Progress Notes (Signed)
Subjective:  Patient ID: [name], male    DOB: [birth year],  MRN: [id number]  Chief Complaint  Patient presents with   Toe Pain    Diabetic foot check. Preulcerative calluses bilaterally. Left foot swelling with chronic deformity. Some redness and edema. On doxy last week or so. Significant callus with swelling left 3rd toe associated with hammertoe    Patient presents for  new concern as above.   Relates burning and tingling in their feet. Patient is diabetic and last A1c was  Lab Results  Component Value Date   HGBA1C 8.9 (H) 02/03/2023   .   PCP:  Patient, [name]     Review of Systems: Negative except as noted in the HPI. Denies N/V/F/Ch.  Past Medical History:  Diagnosis Date   Anxiety    Arthritis    Diabetes mellitus     Current Outpatient Medications:    ALPRAZolam (XANAX) 0.5 MG tablet, TAKE 1/2 TO 1 (ONE-HALF TO ONE) TABLET BY MOUTH AT BEDTIME AS NEEDED FOR ANXIETY OR SLEEP, Disp: 30 tablet, Rfl: 0   aspirin 81 MG tablet, Take 1 tablet (81 mg total) by mouth daily., Disp: 60 tablet, Rfl: 6   blood glucose meter kit and supplies KIT, Per insurance preference. Check blood glucose once a day. Dx E11.9, Z79.4, Disp: 1 each, Rfl: 11   cephALEXin (KEFLEX) 500 MG capsule, Take 1 capsule (500 mg total) by mouth 3 (three) times daily., Disp: 30 capsule, Rfl: 2   colchicine 0.6 MG tablet, Take 0.6 mg by mouth daily., Disp: , Rfl:    doxycycline (VIBRA-TABS) 100 MG tablet, Take 1 tablet (100 mg total) by mouth 2 (two) times daily., Disp: 60 tablet, Rfl: 0   Dulaglutide (TRULICITY) 3 MG/0.5ML SOPN, Inject 3 mg into the skin once a week., Disp: , Rfl:    gabapentin (NEURONTIN) 300 MG capsule, Take 1 capsule (300 mg total) by mouth 2 (two) times daily., Disp: 90 capsule, Rfl: 3   glucose blood test strip, Test blood sugar daily. Dx code: 250.60., Disp: 100 each, Rfl: 3   insulin glargine (LANTUS SOLOSTAR) 100 UNIT/ML Solostar Pen, Inject 16 Units into the skin at bedtime.  (Patient not taking: Reported on 02/02/2023), Disp: 15 mL, Rfl: 11   Insulin Pen Needle (PEN NEEDLES) 32G X 6 MM MISC, 1 each by Does not apply route at bedtime., Disp: 100 each, Rfl: 1   Lancets MISC, Test blood sugar daily. Dx code 250.60, Disp: 100 each, Rfl: 3   latanoprost (XALATAN) 0.005 % ophthalmic solution, Place 1 drop into both eyes at bedtime., Disp: , Rfl:    lisinopril (ZESTRIL) 5 MG tablet, Take 0.5 tablets (2.5 mg total) by mouth daily., Disp: 90 tablet, Rfl: 1   omeprazole (PRILOSEC) 40 MG capsule, Take 1 capsule (40 mg total) by mouth daily., Disp: 90 capsule, Rfl: 3   sildenafil (VIAGRA) 100 MG tablet, Take 100 mg by mouth as needed for erectile dysfunction., Disp: , Rfl:    traMADol (ULTRAM) 50 MG tablet, Take by mouth., Disp: , Rfl:   Current Facility-Administered Medications:    l-methylfolate-B6-B12 (METANX) 3-35-2 MG per tablet 1 tablet, 1 tablet, Oral, Daily,   Social History   Tobacco Use  Smoking Status Former   Types: Cigars   Quit date: 01/09/2017   Years since quitting: 6.6  Smokeless Tobacco Never    No Known Allergies Objective:  Vascular: DP/PT pulses 2/4 bilateral. CFT <3 seconds. Absent hair growth on digits. Edema noted to  bilateral lower extremities. Xerosis noted bilaterally.  Skin. No lacerations or abrasions bilateral feet. Nails 1-5 bilateral  are thickened discolored and elongated with subungual debris. Hyperkeratotic lesion and previous ulceration sub third metatarsal on left . Ulceration noted distal digit with mild erythema around. No puruelnce and near probe to bone about 0.5 cm in diameter.  Musculoskeletal: MMT 5/5 bilateral lower extremities in DF, PF, Inversion and Eversion. Deceased ROM in DF of ankle joint.  Neurological: Sensation intact to light touch. Protective sensation diminished bilateral.   Assessment:   1. Diabetic ulcer of toe of left foot associated with type 2 diabetes mellitus, with fat layer exposed (HCC)   2. DM type 2  with diabetic peripheral neuropathy (HCC)   3. PAD (peripheral artery disease) (HCC)   4. Pre-ulcerative calluses         Plan:  Patient was evaluated and treated and all questions answered. Ulcer left third digit  X-rays reviewed. No osseous erosions noted currently.  -Debridement as below. -Dressed with betadine, DSD. -Off-loading with surgical shoe. -Doxycycline and keflex prescribed.  -Gabapentin and metanx prescribed for neuropathy pains.   -Discussed glucose control and proper protein-rich diet.  -Discussed if any worsening redness, pain, fever or chills to call or may need to report to the emergency room. Patient expressed understanding.   Procedure: Excisional Debridement of Wound Rationale: Removal of non-viable soft tissue from the wound to promote healing.  Anesthesia: none Pre-Debridement Wound Measurements: Overlying callus  Post-Debridement Wound Measurements: 0.5 cm x 0.5 cm x 0.3 cm  Type of Debridement: Sharp Excisional Tissue Removed: Non-viable soft tissue Depth of Debridement: subcutaneous tissue. Technique: Sharp excisional debridement to bleeding, viable wound base.  Dressing: Dry, sterile, compression dressing. Disposition: Patient tolerated procedure well. Patient to return in 2 week for follow-up.  Return in about 2 weeks (around 08/29/2023) for wound check.    Return in about 2 weeks (around 08/29/2023) for wound check.    Return in about 2 weeks (around 08/29/2023) for wound check.

## 2023-09-01 ENCOUNTER — Other Ambulatory Visit: Payer: Self-pay | Admitting: Podiatry

## 2023-09-05 ENCOUNTER — Ambulatory Visit (INDEPENDENT_AMBULATORY_CARE_PROVIDER_SITE_OTHER): Payer: 59 | Admitting: Podiatry

## 2023-09-05 DIAGNOSIS — Z91199 Patient's noncompliance with other medical treatment and regimen due to unspecified reason: Secondary | ICD-10-CM

## 2023-09-05 NOTE — Progress Notes (Signed)
No show

## 2023-11-09 ENCOUNTER — Other Ambulatory Visit: Payer: Self-pay | Admitting: Podiatry

## 2023-11-20 IMAGING — DX DG FOOT COMPLETE 3+V*L*
3 series · 3 of 3 positions shown · non-contrast
Comparison: None.

CLINICAL DATA: Per ED Notes - Patient [facility] [REDACTED] from PCP Office for
Wound Assessment. Endorses having Blister-Like Formation to Left
Middle Toe approximately 1 Week ago that now appears infected.
Patient has been treating with Neosporin at Home

EXAM:
LEFT FOOT - COMPLETE 3+ VIEW

[foot ap]
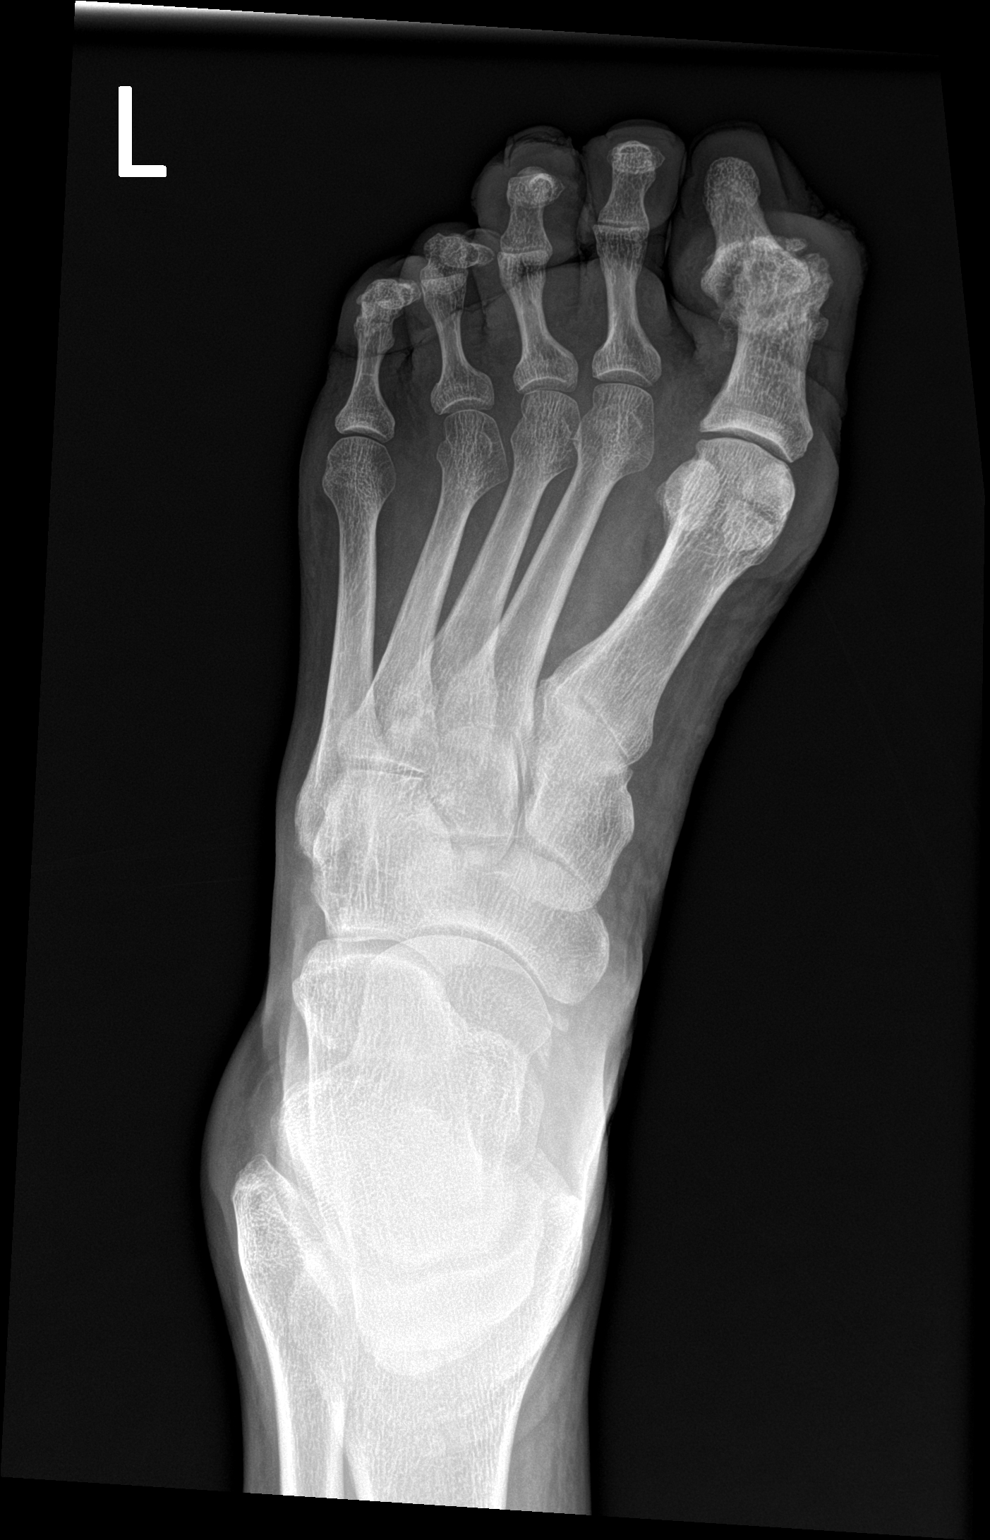

[foot obl]
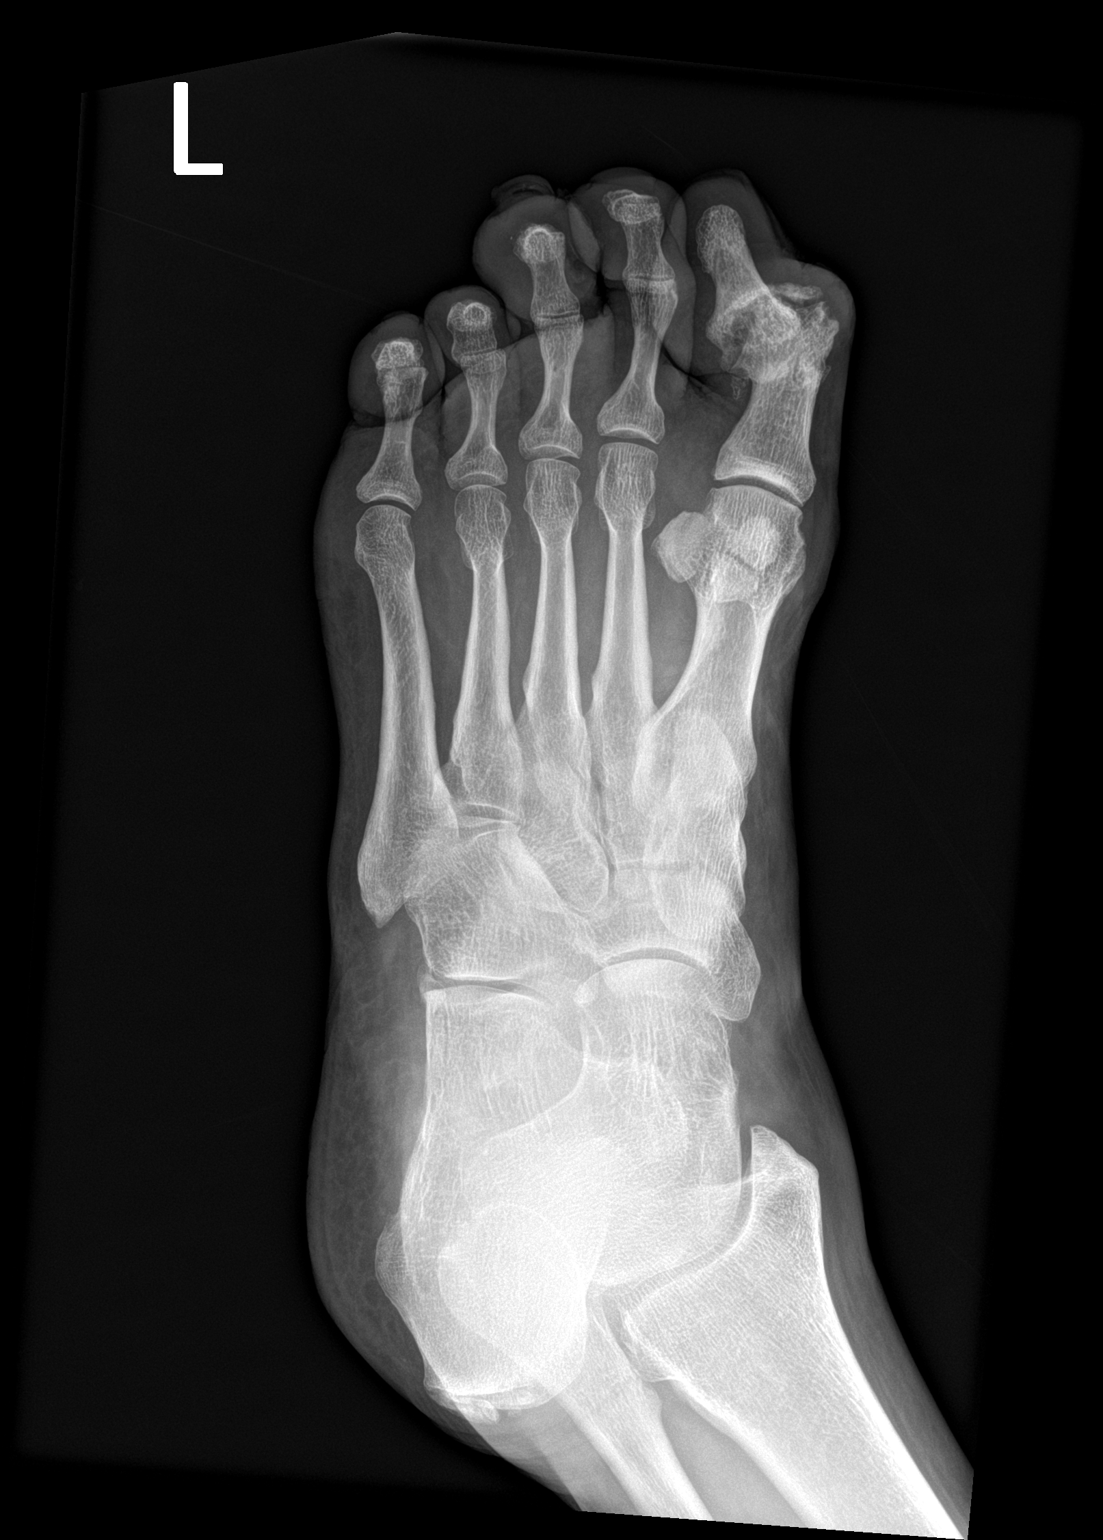

[foot lat]
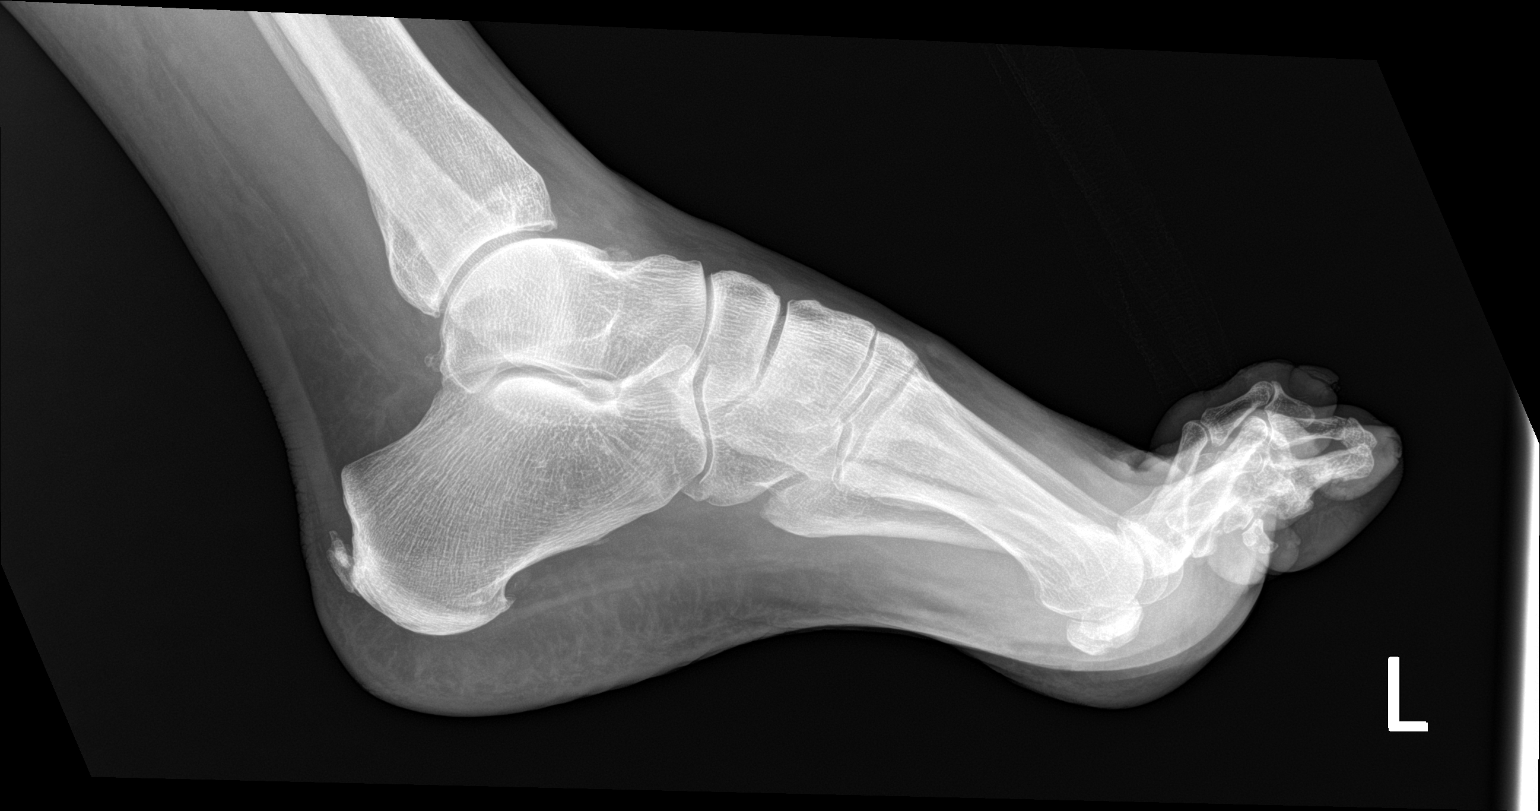

[3 of 3 positions shown; findings below may reference images not displayed]

FINDINGS: Paratracheal erosion at the first proximal interphalangeal joint.
There is loss of the joint space and associated sclerosis and
osteophytosis.

No osseous erosion evident in the third digit. No subcutaneous gas
identified.
IMPRESSION: 1. Severe arthropathy of the first interphalangeal joint.
2. No osteomyelitis of the middle digit.

## 2024-01-16 ENCOUNTER — Telehealth: Payer: Self-pay | Admitting: Podiatry

## 2024-01-16 NOTE — Telephone Encounter (Signed)
 Patient called and would like his prescription for his foot sent to the pharmacy. Thank you.

## 2024-01-28 ENCOUNTER — Telehealth: Payer: Self-pay

## 2024-01-28 NOTE — Telephone Encounter (Signed)
 Patient is scheduled for follow up on 02/13/24 (2 weeks) He is upset - he does not want to go that long without antibiotics,  he is deathly afraid of the infection coming back. He is asking to either be seen sooner, or have antibiotics called in to take in the meantime.

## 2024-01-28 NOTE — Telephone Encounter (Signed)
 Left message for pt that I could get him in to [place] office this week but as of now I do not have anything for [place]. Pt is on waitlist. Asked him to try calling tomorrow afternoon to see if any cancellations.

## 2024-02-01 NOTE — Telephone Encounter (Signed)
 Left message again asking for pt to call as we did have a cancellation for 3/26 ( next Wednesday) as of now.

## 2024-02-13 ENCOUNTER — Ambulatory Visit (INDEPENDENT_AMBULATORY_CARE_PROVIDER_SITE_OTHER): Admitting: Podiatry

## 2024-02-13 ENCOUNTER — Ambulatory Visit (INDEPENDENT_AMBULATORY_CARE_PROVIDER_SITE_OTHER)

## 2024-02-13 ENCOUNTER — Encounter: Payer: Self-pay | Admitting: Podiatry

## 2024-02-13 DIAGNOSIS — M79674 Pain in right toe(s): Secondary | ICD-10-CM

## 2024-02-13 DIAGNOSIS — M79672 Pain in left foot: Secondary | ICD-10-CM

## 2024-02-13 DIAGNOSIS — E1142 Type 2 diabetes mellitus with diabetic polyneuropathy: Secondary | ICD-10-CM

## 2024-02-13 DIAGNOSIS — I739 Peripheral vascular disease, unspecified: Secondary | ICD-10-CM

## 2024-02-13 DIAGNOSIS — M79675 Pain in left toe(s): Secondary | ICD-10-CM | POA: Diagnosis not present

## 2024-02-13 DIAGNOSIS — M1460 Charcot's joint, unspecified site: Secondary | ICD-10-CM

## 2024-02-13 DIAGNOSIS — L84 Corns and callosities: Secondary | ICD-10-CM

## 2024-02-13 DIAGNOSIS — B351 Tinea unguium: Secondary | ICD-10-CM | POA: Diagnosis not present

## 2024-02-13 NOTE — Progress Notes (Signed)
 Subjective:  Patient ID: [name], male    DOB: 1960/12/28,  MRN: [id number]  No chief complaint on file.   Patient presents for concern of nails and calluses. Also relates he has moved and been on his feet and was concerned for swelling in his left foot. Marland Kitchen He has been lost to follow-up and not returned for 6 months.    Relates burning and tingling in their feet. Patient is diabetic and last A1c was  Lab Results  Component Value Date   HGBA1C 8.9 (H) 02/03/2023   .   PCP:  Patient, [name]     Review of Systems: Negative except as noted in the HPI. Denies N/V/F/Ch.  Past Medical History:  Diagnosis Date   Anxiety    Arthritis    Diabetes mellitus     Current Outpatient Medications:    ALPRAZolam (XANAX) 0.5 MG tablet, TAKE 1/2 TO 1 (ONE-HALF TO ONE) TABLET BY MOUTH AT BEDTIME AS NEEDED FOR ANXIETY OR SLEEP, Disp: 30 tablet, Rfl: 0   aspirin 81 MG tablet, Take 1 tablet (81 mg total) by mouth daily., Disp: 60 tablet, Rfl: 6   blood glucose meter kit and supplies KIT, Per insurance preference. Check blood glucose once a day. Dx E11.9, Z79.4, Disp: 1 each, Rfl: 11   cephALEXin (KEFLEX) 500 MG capsule, Take 1 capsule (500 mg total) by mouth 3 (three) times daily., Disp: 30 capsule, Rfl: 2   colchicine 0.6 MG tablet, Take 0.6 mg by mouth daily., Disp: , Rfl:    doxycycline (VIBRA-TABS) 100 MG tablet, TAKE 1 TABLET(100 MG) BY MOUTH TWICE DAILY, Disp: 60 tablet, Rfl: 0   Dulaglutide (TRULICITY) 3 MG/0.5ML SOPN, Inject 3 mg into the skin once a week., Disp: , Rfl:    gabapentin (NEURONTIN) 300 MG capsule, Take 1 capsule (300 mg total) by mouth 2 (two) times daily., Disp: 90 capsule, Rfl: 3   glucose blood test strip, Test blood sugar daily. Dx code: 250.60., Disp: 100 each, Rfl: 3   insulin glargine (LANTUS SOLOSTAR) 100 UNIT/ML Solostar Pen, Inject 16 Units into the skin at bedtime. (Patient not taking: Reported on 02/02/2023), Disp: 15 mL, Rfl: 11   Insulin Pen Needle (PEN NEEDLES)  32G X 6 MM MISC, 1 each by Does not apply route at bedtime., Disp: 100 each, Rfl: 1   Lancets MISC, Test blood sugar daily. Dx code 250.60, Disp: 100 each, Rfl: 3   latanoprost (XALATAN) 0.005 % ophthalmic solution, Place 1 drop into both eyes at bedtime., Disp: , Rfl:    lisinopril (ZESTRIL) 5 MG tablet, Take 0.5 tablets (2.5 mg total) by mouth daily., Disp: 90 tablet, Rfl: 1   omeprazole (PRILOSEC) 40 MG capsule, Take 1 capsule (40 mg total) by mouth daily., Disp: 90 capsule, Rfl: 3   sildenafil (VIAGRA) 100 MG tablet, Take 100 mg by mouth as needed for erectile dysfunction., Disp: , Rfl:    traMADol (ULTRAM) 50 MG tablet, Take by mouth., Disp: , Rfl:   Current Facility-Administered Medications:    l-methylfolate-B6-B12 (METANX) 3-35-2 MG per tablet 1 tablet, 1 tablet, Oral, Daily,   Social History   Tobacco Use  Smoking Status Former   Types: Cigars   Quit date: 01/09/2017   Years since quitting: 7.0  Smokeless Tobacco Never    No Known Allergies Objective:  Vascular: DP/PT pulses 2/4 bilateral. CFT <3 seconds. Absent hair growth on digits. Edema noted to bilateral lower extremities. Xerosis noted bilaterally.  Skin. No lacerations or abrasions  bilateral feet. Nails 1-5 bilateral  are thickened discolored and elongated with subungual debris. Hyperkeratotic lesion and previous ulceration sub third metatarsal on left .  Musculoskeletal: MMT 5/5 bilateral lower extremities in DF, PF, Inversion and Eversion. Deceased ROM in DF of ankle joint. Promence of medial arch noted and dorsal foot more pronounced than prior.  Neurological: Sensation intact to light touch. Protective sensation diminished bilateral.   Assessment:   1. Pain due to onychomycosis of toenails of both feet   2. Pre-ulcerative calluses   3. DM type 2 with diabetic peripheral neuropathy (HCC)   4. PAD (peripheral artery disease) (HCC)   5. Charcot arthropathy          Plan:  Patient was evaluated and treated  and all questions answered. -X-rays reviewed. Destructive changes noted in midfoot consistent with charcot changes. No full consolidation noted.  Discuss charcot arthropathy with patient and advised on NWB and keeping as much weight on foot off as possible until flare passes. Discussed this is a chronic disorder of the foot that requires constant monitoring for progression.  CAM boot dispensed.   -Discussed and educated patient on diabetic foot care, especially with  regards to the vascular, neurological and musculoskeletal systems.  -Stressed the importance of good glycemic control and the detriment of not  controlling glucose levels in relation to the foot. -Discussed supportive shoes at all times and checking feet regularly.  -Mechanically debrided all nails 1-5 bilateral using sterile nail nipper and filed with dremel without incident  -Answered all patient questions -Discussed with patient it is not good to take an oral antibiotic one pill at a time for minor injuries as this can cause antibiotic resistance. No concern for infection today and will not provide antibiotics.  -Patient to return  in 1 month for recheck of foot.  -Patient advised to call the office if any problems or questions arise in the meantime.  Return in about 4 weeks (around 03/12/2024) for charcot .    Return in about 4 weeks (around 03/12/2024) for charcot .    Return in about 4 weeks (around 03/12/2024) for charcot .

## 2024-02-28 ENCOUNTER — Other Ambulatory Visit: Payer: Self-pay | Admitting: Podiatry

## 2024-02-28 ENCOUNTER — Telehealth: Payer: Self-pay | Admitting: Podiatry

## 2024-02-28 MED ORDER — METANX 3-90.314-2-35 MG PO CAPS: 1.0000 | ORAL_CAPSULE | Freq: Every day | ORAL | 3 refills | Status: AC

## 2024-02-28 NOTE — Telephone Encounter (Signed)
 The patient called and he reports he has called to check on the status of his MetaNX  capsules and he is getting super frustrated about not being able to get his patches at this time. I did call [facility] [place] [facility] and they do not carry those patches.   He was super upset and is not sure why we are forgetting him or treating his medication.  $110 with discount card per the pharmacy. Can you please send them in to [facility]?

## 2024-03-12 ENCOUNTER — Ambulatory Visit (INDEPENDENT_AMBULATORY_CARE_PROVIDER_SITE_OTHER): Admitting: Podiatry

## 2024-03-12 ENCOUNTER — Ambulatory Visit (INDEPENDENT_AMBULATORY_CARE_PROVIDER_SITE_OTHER)

## 2024-03-12 ENCOUNTER — Encounter: Payer: Self-pay | Admitting: Podiatry

## 2024-03-12 DIAGNOSIS — E1142 Type 2 diabetes mellitus with diabetic polyneuropathy: Secondary | ICD-10-CM

## 2024-03-12 DIAGNOSIS — M1460 Charcot's joint, unspecified site: Secondary | ICD-10-CM

## 2024-03-12 NOTE — Progress Notes (Signed)
 Subjective:  Patient ID: [name], male    DOB: 11-18-1960,  MRN: [id number]  No chief complaint on file.   Patient presents for follow-up of charcto. Doing well in the boot. Relates pain improving.    Relates burning and tingling in their feet. Patient is diabetic and last A1c was  Lab Results  Component Value Date   HGBA1C 8.9 (H) 02/03/2023   .   PCP:  Patient, [name]     Review of Systems: Negative except as noted in the HPI. Denies N/V/F/Ch.  Past Medical History:  Diagnosis Date   Anxiety    Arthritis    Diabetes mellitus     Current Outpatient Medications:    ALPRAZolam  (XANAX ) 0.5 MG tablet, TAKE 1/2 TO 1 (ONE-HALF TO ONE) TABLET BY MOUTH AT BEDTIME AS NEEDED FOR ANXIETY OR SLEEP, Disp: 30 tablet, Rfl: 0   aspirin  81 MG tablet, Take 1 tablet (81 mg total) by mouth daily., Disp: 60 tablet, Rfl: 6   blood glucose meter kit and supplies KIT, Per insurance preference. Check blood glucose once a day. Dx E11.9, Z79.4, Disp: 1 each, Rfl: 11   cephALEXin  (KEFLEX ) 500 MG capsule, Take 1 capsule (500 mg total) by mouth 3 (three) times daily., Disp: 30 capsule, Rfl: 2   colchicine  0.6 MG tablet, Take 0.6 mg by mouth daily., Disp: , Rfl:    doxycycline  (VIBRA -TABS) 100 MG tablet, TAKE 1 TABLET(100 MG) BY MOUTH TWICE DAILY, Disp: 60 tablet, Rfl: 0   Dulaglutide  (TRULICITY ) 3 MG/0.5ML SOPN, Inject 3 mg into the skin once a week., Disp: , Rfl:    gabapentin  (NEURONTIN ) 300 MG capsule, Take 1 capsule (300 mg total) by mouth 2 (two) times daily., Disp: 90 capsule, Rfl: 3   glucose blood test strip, Test blood sugar daily. Dx code: 250.60., Disp: 100 each, Rfl: 3   insulin  glargine (LANTUS  SOLOSTAR) 100 UNIT/ML Solostar Pen, Inject 16 Units into the skin at bedtime. (Patient not taking: Reported on 02/02/2023), Disp: 15 mL, Rfl: 11   Insulin  Pen Needle (PEN NEEDLES) 32G X 6 MM MISC, 1 each by Does not apply route at bedtime., Disp: 100 each, Rfl: 1   L-Methylfolate-Algae-B12-B6  (METANX) 3-90.314-2-35 MG CAPS, Take 1 capsule by mouth daily., Disp: 60 capsule, Rfl: 3   Lancets MISC, Test blood sugar daily. Dx code 250.60, Disp: 100 each, Rfl: 3   latanoprost (XALATAN) 0.005 % ophthalmic solution, Place 1 drop into both eyes at bedtime., Disp: , Rfl:    lisinopril  (ZESTRIL ) 5 MG tablet, Take 0.5 tablets (2.5 mg total) by mouth daily., Disp: 90 tablet, Rfl: 1   omeprazole  (PRILOSEC) 40 MG capsule, Take 1 capsule (40 mg total) by mouth daily., Disp: 90 capsule, Rfl: 3   sildenafil (VIAGRA) 100 MG tablet, Take 100 mg by mouth as needed for erectile dysfunction., Disp: , Rfl:    traMADol  (ULTRAM ) 50 MG tablet, Take by mouth., Disp: , Rfl:   Current Facility-Administered Medications:    l-methylfolate-B6-B12 (METANX) 3-35-2 MG per tablet 1 tablet, 1 tablet, Oral, Daily,   Social History   Tobacco Use  Smoking Status Former   Types: Cigars   Quit date: 01/09/2017   Years since quitting: 7.1  Smokeless Tobacco Never    No Known Allergies Objective:  Vascular: DP/PT pulses 2/4 bilateral. CFT <3 seconds. Absent hair growth on digits. Edema noted to bilateral lower extremities. Xerosis noted bilaterally.  Skin. No lacerations or abrasions bilateral feet. Nails 1-5 bilateral  are thickened  discolored and elongated with subungual debris. Hyperkeratotic lesion and previous ulceration sub third metatarsal on left .  Musculoskeletal: MMT 5/5 bilateral lower extremities in DF, PF, Inversion and Eversion. Deceased ROM in DF of ankle joint. Promence of medial arch noted and dorsal foot more pronounced than prior.  Neurological: Sensation intact to light touch. Protective sensation diminished bilateral.   Assessment:   1. Charcot arthropathy   2. DM type 2 with diabetic peripheral neuropathy (HCC)          Plan:  Patient was evaluated and treated and all questions answered. -X-rays reviewed. Destructive changes noted in midfoot consistent with charcot changes. No full  consolidation noted. Some more consolidation from previous.  Discuss charcot arthropathy with patient and advised on NWB and keeping as much weight on foot off as possible until flare passes. Discussed this is a chronic disorder of the foot that requires constant monitoring for progression.  Continue CAM boot.   -Discussed and educated patient on diabetic foot care, especially with  regards to the vascular, neurological and musculoskeletal systems.  -Stressed the importance of good glycemic control and the detriment of not  controlling glucose levels in relation to the foot. -Discussed supportive shoes at all times and checking feet regularly.  -Patient to return  in 1 month for recheck of foot.  -Patient advised to call the office if any problems or questions arise in the meantime.  No follow-ups on file.    No follow-ups on file.    No follow-ups on file.

## 2024-04-16 ENCOUNTER — Ambulatory Visit (INDEPENDENT_AMBULATORY_CARE_PROVIDER_SITE_OTHER): Admitting: Podiatry

## 2024-04-16 DIAGNOSIS — Z91199 Patient's noncompliance with other medical treatment and regimen due to unspecified reason: Secondary | ICD-10-CM

## 2024-04-16 NOTE — Progress Notes (Signed)
 Cancel 24 hours

## 2024-04-30 ENCOUNTER — Ambulatory Visit (INDEPENDENT_AMBULATORY_CARE_PROVIDER_SITE_OTHER)

## 2024-04-30 ENCOUNTER — Ambulatory Visit (INDEPENDENT_AMBULATORY_CARE_PROVIDER_SITE_OTHER): Admitting: Podiatry

## 2024-04-30 ENCOUNTER — Encounter: Payer: Self-pay | Admitting: Podiatry

## 2024-04-30 DIAGNOSIS — M14672 Charcot's joint, left ankle and foot: Secondary | ICD-10-CM | POA: Diagnosis not present

## 2024-04-30 DIAGNOSIS — E1142 Type 2 diabetes mellitus with diabetic polyneuropathy: Secondary | ICD-10-CM | POA: Diagnosis not present

## 2024-04-30 NOTE — Progress Notes (Signed)
 Subjective:  Patient ID: [name], male    DOB: 09/12/1961,  MRN: [id number]  Chief Complaint  Patient presents with   Foot Pain    RM#7 charcot 4 wks follow up patient states doing better.    Patient presents for follow-up of charcto. Doing well in the boot. Relates pain improving.    Relates burning and tingling in their feet. Patient is diabetic and last A1c was  Lab Results  Component Value Date   HGBA1C 8.9 (H) 02/03/2023   .   PCP:  Patient, [name]     Review of Systems: Negative except as noted in the HPI. Denies N/V/F/Ch.  Past Medical History:  Diagnosis Date   Anxiety    Arthritis    Diabetes mellitus     Current Outpatient Medications:    ALPRAZolam  (XANAX ) 0.5 MG tablet, TAKE 1/2 TO 1 (ONE-HALF TO ONE) TABLET BY MOUTH AT BEDTIME AS NEEDED FOR ANXIETY OR SLEEP, Disp: 30 tablet, Rfl: 0   aspirin  81 MG tablet, Take 1 tablet (81 mg total) by mouth daily., Disp: 60 tablet, Rfl: 6   blood glucose meter kit and supplies KIT, Per insurance preference. Check blood glucose once a day. Dx E11.9, Z79.4, Disp: 1 each, Rfl: 11   cephALEXin  (KEFLEX ) 500 MG capsule, Take 1 capsule (500 mg total) by mouth 3 (three) times daily., Disp: 30 capsule, Rfl: 2   colchicine  0.6 MG tablet, Take 0.6 mg by mouth daily., Disp: , Rfl:    doxycycline  (VIBRA -TABS) 100 MG tablet, TAKE 1 TABLET(100 MG) BY MOUTH TWICE DAILY, Disp: 60 tablet, Rfl: 0   Dulaglutide  (TRULICITY ) 3 MG/0.5ML SOPN, Inject 3 mg into the skin once a week., Disp: , Rfl:    gabapentin  (NEURONTIN ) 300 MG capsule, Take 1 capsule (300 mg total) by mouth 2 (two) times daily., Disp: 90 capsule, Rfl: 3   glucose blood test strip, Test blood sugar daily. Dx code: 250.60., Disp: 100 each, Rfl: 3   insulin  glargine (LANTUS  SOLOSTAR) 100 UNIT/ML Solostar Pen, Inject 16 Units into the skin at bedtime. (Patient not taking: Reported on 02/02/2023), Disp: 15 mL, Rfl: 11   Insulin  Pen Needle (PEN NEEDLES) 32G X 6 MM MISC, 1 each by Does  not apply route at bedtime., Disp: 100 each, Rfl: 1   L-Methylfolate-Algae-B12-B6 (METANX) 3-90.314-2-35 MG CAPS, Take 1 capsule by mouth daily., Disp: 60 capsule, Rfl: 3   Lancets MISC, Test blood sugar daily. Dx code 250.60, Disp: 100 each, Rfl: 3   latanoprost (XALATAN) 0.005 % ophthalmic solution, Place 1 drop into both eyes at bedtime., Disp: , Rfl:    lisinopril  (ZESTRIL ) 5 MG tablet, Take 0.5 tablets (2.5 mg total) by mouth daily., Disp: 90 tablet, Rfl: 1   omeprazole  (PRILOSEC) 40 MG capsule, Take 1 capsule (40 mg total) by mouth daily., Disp: 90 capsule, Rfl: 3   sildenafil (VIAGRA) 100 MG tablet, Take 100 mg by mouth as needed for erectile dysfunction., Disp: , Rfl:    traMADol  (ULTRAM ) 50 MG tablet, Take by mouth., Disp: , Rfl:   Current Facility-Administered Medications:    l-methylfolate-B6-B12 (METANX) 3-35-2 MG per tablet 1 tablet, 1 tablet, Oral, Daily,   Social History   Tobacco Use  Smoking Status Former   Types: Cigars   Quit date: 01/09/2017   Years since quitting: 7.3  Smokeless Tobacco Never    No Known Allergies Objective:  Vascular: DP/PT pulses 2/4 bilateral. CFT <3 seconds. Absent hair growth on digits. Edema noted to bilateral  lower extremities. Xerosis noted bilaterally.  Skin. No lacerations or abrasions bilateral feet. Nails 1-5 bilateral  are thickened discolored and elongated with subungual debris. Hyperkeratotic lesion and previous ulceration sub third metatarsal on left .  Musculoskeletal: MMT 5/5 bilateral lower extremities in DF, PF, Inversion and Eversion. Deceased ROM in DF of ankle joint. Promence of medial arch noted and dorsal foot more pronounced than prior.  Neurological: Sensation intact to light touch. Protective sensation diminished bilateral.   Assessment:   1. Charcot joint of left foot   2. DM type 2 with diabetic peripheral neuropathy (HCC)           Plan:  Patient was evaluated and treated and all questions answered. -X-rays  reviewed. Destructive changes noted in midfoot consistent with charcot changes. No full consolidation noted. Some more consolidation from previous.  Discuss charcot arthropathy with patient and advised on NWB and keeping as much weight on foot off as possible until flare passes. Discussed this is a chronic disorder of the foot that requires constant monitoring for progression.  Continue CAM boot.   -Will work on getting charcot boots.  -Discussed and educated patient on diabetic foot care, especially with  regards to the vascular, neurological and musculoskeletal systems.  -Stressed the importance of good glycemic control and the detriment of not  controlling glucose levels in relation to the foot. -Discussed supportive shoes at all times and checking feet regularly.  -Patient to return  in 1 month for recheck of foot.  -Patient advised to call the office if any problems or questions arise in the meantime.  No follow-ups on file.    No follow-ups on file.    No follow-ups on file.

## 2024-06-02 ENCOUNTER — Ambulatory Visit (INDEPENDENT_AMBULATORY_CARE_PROVIDER_SITE_OTHER): Admitting: Podiatry

## 2024-06-02 ENCOUNTER — Ambulatory Visit: Admitting: Podiatry

## 2024-06-02 ENCOUNTER — Other Ambulatory Visit

## 2024-06-02 DIAGNOSIS — Z91199 Patient's noncompliance with other medical treatment and regimen due to unspecified reason: Secondary | ICD-10-CM

## 2024-06-02 NOTE — Progress Notes (Signed)
 Cancel 24 hours

## 2024-06-18 ENCOUNTER — Telehealth: Payer: Self-pay | Admitting: Podiatry

## 2024-06-18 ENCOUNTER — Encounter: Payer: Self-pay | Admitting: Podiatry

## 2024-06-18 ENCOUNTER — Ambulatory Visit: Admitting: Podiatry

## 2024-06-18 ENCOUNTER — Ambulatory Visit (INDEPENDENT_AMBULATORY_CARE_PROVIDER_SITE_OTHER)

## 2024-06-18 ENCOUNTER — Ambulatory Visit

## 2024-06-18 DIAGNOSIS — M14672 Charcot's joint, left ankle and foot: Secondary | ICD-10-CM

## 2024-06-18 DIAGNOSIS — E1142 Type 2 diabetes mellitus with diabetic polyneuropathy: Secondary | ICD-10-CM

## 2024-06-18 NOTE — Telephone Encounter (Signed)
 Patient when checking out asked to have Dr. Joya call him. He is not active on my chart. He was seen by her and Carley today and he is confused  as what device he is to be getting. shoes or boot. You Sr. Joya could please call the patient. Thank you.

## 2024-06-18 NOTE — Progress Notes (Signed)
 Clarified with Dr Joya she would like patient to get custom made shoes will need patient to come back for 1hour appt to cast Bil Feet and choose shoe  [name]

## 2024-06-18 NOTE — Progress Notes (Signed)
 Subjective:  Patient ID: [name], male    DOB: July 14, 1961,  MRN: [id number]  Chief Complaint  Patient presents with   Diabetes    It's okay.  PCP - Melissa ?;  A1c - ?    Patient presents for follow-up of charcto. Doing well in the boot. Relates pain improving.    Relates burning and tingling in their feet. Patient is diabetic and last A1c was  Lab Results  Component Value Date   HGBA1C 8.9 (H) 02/03/2023   .   PCP:  Patient, [name]     Review of Systems: Negative except as noted in the HPI. Denies N/V/F/Ch.  Past Medical History:  Diagnosis Date   Anxiety    Arthritis    Diabetes mellitus     Current Outpatient Medications:    ALPRAZolam  (XANAX ) 0.5 MG tablet, TAKE 1/2 TO 1 (ONE-HALF TO ONE) TABLET BY MOUTH AT BEDTIME AS NEEDED FOR ANXIETY OR SLEEP, Disp: 30 tablet, Rfl: 0   aspirin  81 MG tablet, Take 1 tablet (81 mg total) by mouth daily., Disp: 60 tablet, Rfl: 6   blood glucose meter kit and supplies KIT, Per insurance preference. Check blood glucose once a day. Dx E11.9, Z79.4, Disp: 1 each, Rfl: 11   cephALEXin  (KEFLEX ) 500 MG capsule, Take 1 capsule (500 mg total) by mouth 3 (three) times daily., Disp: 30 capsule, Rfl: 2   colchicine  0.6 MG tablet, Take 0.6 mg by mouth daily., Disp: , Rfl:    doxycycline  (VIBRA -TABS) 100 MG tablet, TAKE 1 TABLET(100 MG) BY MOUTH TWICE DAILY, Disp: 60 tablet, Rfl: 0   Dulaglutide  (TRULICITY ) 3 MG/0.5ML SOPN, Inject 3 mg into the skin once a week., Disp: , Rfl:    gabapentin  (NEURONTIN ) 300 MG capsule, Take 1 capsule (300 mg total) by mouth 2 (two) times daily., Disp: 90 capsule, Rfl: 3   glucose blood test strip, Test blood sugar daily. Dx code: 250.60., Disp: 100 each, Rfl: 3   insulin  glargine (LANTUS  SOLOSTAR) 100 UNIT/ML Solostar Pen, Inject 16 Units into the skin at bedtime., Disp: 15 mL, Rfl: 11   Insulin  Pen Needle (PEN NEEDLES) 32G X 6 MM MISC, 1 each by Does not apply route at bedtime., Disp: 100 each, Rfl: 1    L-Methylfolate-Algae-B12-B6 (METANX) 3-90.314-2-35 MG CAPS, Take 1 capsule by mouth daily., Disp: 60 capsule, Rfl: 3   Lancets MISC, Test blood sugar daily. Dx code 250.60, Disp: 100 each, Rfl: 3   latanoprost (XALATAN) 0.005 % ophthalmic solution, Place 1 drop into both eyes at bedtime., Disp: , Rfl:    lisinopril  (ZESTRIL ) 5 MG tablet, Take 0.5 tablets (2.5 mg total) by mouth daily., Disp: 90 tablet, Rfl: 1   omeprazole  (PRILOSEC) 40 MG capsule, Take 1 capsule (40 mg total) by mouth daily., Disp: 90 capsule, Rfl: 3   sildenafil (VIAGRA) 100 MG tablet, Take 100 mg by mouth as needed for erectile dysfunction., Disp: , Rfl:    traMADol  (ULTRAM ) 50 MG tablet, Take by mouth., Disp: , Rfl:   Current Facility-Administered Medications:    l-methylfolate-B6-B12 (METANX) 3-35-2 MG per tablet 1 tablet, 1 tablet, Oral, Daily,   Social History   Tobacco Use  Smoking Status Former   Types: Cigars   Quit date: 01/09/2017   Years since quitting: 7.4  Smokeless Tobacco Never    No Known Allergies Objective:  Vascular: DP/PT pulses 2/4 bilateral. CFT <3 seconds. Absent hair growth on digits. Edema noted to bilateral lower extremities. Xerosis noted bilaterally.  Skin. No lacerations or abrasions bilateral feet. Nails 1-5 bilateral  are thickened discolored and elongated with subungual debris. Hyperkeratotic lesion and previous ulceration sub third metatarsal on left .  Musculoskeletal: MMT 5/5 bilateral lower extremities in DF, PF, Inversion and Eversion. Deceased ROM in DF of ankle joint. Promence of medial arch noted and dorsal foot more pronounced than prior.  Neurological: Sensation intact to light touch. Protective sensation diminished bilateral.   Assessment:   1. Charcot joint of left foot           Plan:  Patient was evaluated and treated and all questions answered. -X-rays reviewed. Destructive changes noted in midfoot consistent with charcot changes. No full consolidation noted.  Some more consolidation from previous. Improving.  Discuss charcot arthropathy with patient and advised on NWB and keeping as much weight on foot off as possible until flare passes. Discussed this is a chronic disorder of the foot that requires constant monitoring for progression.  Continue CAM boot.   -Fitting today for charcot boots.  -Discussed and educated patient on diabetic foot care, especially with  regards to the vascular, neurological and musculoskeletal systems.  -Stressed the importance of good glycemic control and the detriment of not  controlling glucose levels in relation to the foot. -Discussed supportive shoes at all times and checking feet regularly.  -Patient to return  in 4 month for recheck of foot.  -Patient advised to call the office if any problems or questions arise in the meantime.  No follow-ups on file.    No follow-ups on file.    No follow-ups on file.

## 2024-07-16 ENCOUNTER — Ambulatory Visit: Admitting: Podiatry

## 2024-10-13 ENCOUNTER — Telehealth: Payer: Self-pay

## 2024-10-13 NOTE — Telephone Encounter (Signed)
 Patient called to request a refill on previously prescribed antibiotics ( ciprofloxacin or doxycycline ). Patient states swelling of the left third toe. Patient was advised to schedule an appointment, transferred to [facility] ( [phone]. Patient request all medications refills be sent to [facility], [address] [place], [place] [address].

## 2024-10-14 ENCOUNTER — Other Ambulatory Visit: Payer: Self-pay | Admitting: Podiatry

## 2024-10-14 MED ORDER — DOXYCYCLINE HYCLATE 100 MG PO TABS: 100.0000 mg | ORAL_TABLET | Freq: Two times a day (BID) | ORAL | 0 refills | Status: DC

## 2024-10-14 NOTE — Telephone Encounter (Signed)
 Pt called to schedule an appointment, which has been set for tomorrow. He was also informed that the prescription has been sent to the requested pharmacy.

## 2024-10-15 ENCOUNTER — Encounter: Payer: Self-pay | Admitting: Podiatry

## 2024-10-15 ENCOUNTER — Ambulatory Visit

## 2024-10-15 ENCOUNTER — Ambulatory Visit: Admitting: Podiatry

## 2024-10-15 DIAGNOSIS — L97524 Non-pressure chronic ulcer of other part of left foot with necrosis of bone: Secondary | ICD-10-CM | POA: Diagnosis not present

## 2024-10-15 DIAGNOSIS — M14672 Charcot's joint, left ankle and foot: Secondary | ICD-10-CM

## 2024-10-15 DIAGNOSIS — E08621 Diabetes mellitus due to underlying condition with foot ulcer: Secondary | ICD-10-CM | POA: Diagnosis not present

## 2024-10-15 MED ORDER — CIPROFLOXACIN HCL 500 MG PO TABS: 500.0000 mg | ORAL_TABLET | Freq: Two times a day (BID) | ORAL | 0 refills | Status: AC

## 2024-10-15 MED ORDER — DOXYCYCLINE HYCLATE 100 MG PO TABS: 100.0000 mg | ORAL_TABLET | Freq: Two times a day (BID) | ORAL | 0 refills | Status: AC

## 2024-10-15 NOTE — Progress Notes (Signed)
 Subjective:  Patient ID: [name], male    DOB: 1961-06-18,  MRN: [id number]  No chief complaint on file.   Patient presents for new concern of injury to his left third digit.  He relates the other day he was trimming his toenails and cut his third digit nail.  He relates last Wednesday he noticed it getting red and swollen.  He did call to get antibiotics.  He does relate he has not picked them up because he has difficulty with transportation and has had to order them.  He relates he is post to get them today.  He is concerned about losing his toe.  Patient is diabetic and last A1c was  Lab Results  Component Value Date   HGBA1C 8.9 (H) 02/03/2023   .   PCP:  Patient, [name]     Review of Systems: Negative except as noted in the HPI. Denies N/V/F/Ch.  Past Medical History:  Diagnosis Date   Anxiety    Arthritis    Diabetes mellitus     Current Outpatient Medications:    ciprofloxacin  (CIPRO ) 500 MG tablet, Take 1 tablet (500 mg total) by mouth 2 (two) times daily for 10 days., Disp: 20 tablet, Rfl: 0   ALPRAZolam  (XANAX ) 0.5 MG tablet, TAKE 1/2 TO 1 (ONE-HALF TO ONE) TABLET BY MOUTH AT BEDTIME AS NEEDED FOR ANXIETY OR SLEEP, Disp: 30 tablet, Rfl: 0   aspirin  81 MG tablet, Take 1 tablet (81 mg total) by mouth daily., Disp: 60 tablet, Rfl: 6   blood glucose meter kit and supplies KIT, Per insurance preference. Check blood glucose once a day. Dx E11.9, Z79.4, Disp: 1 each, Rfl: 11   cephALEXin  (KEFLEX ) 500 MG capsule, Take 1 capsule (500 mg total) by mouth 3 (three) times daily., Disp: 30 capsule, Rfl: 2   colchicine  0.6 MG tablet, Take 0.6 mg by mouth daily., Disp: , Rfl:    doxycycline  (VIBRA -TABS) 100 MG tablet, Take 1 tablet (100 mg total) by mouth 2 (two) times daily for 7 days., Disp: 14 tablet, Rfl: 0   Dulaglutide  (TRULICITY ) 3 MG/0.5ML SOPN, Inject 3 mg into the skin once a week., Disp: , Rfl:    gabapentin  (NEURONTIN ) 300 MG capsule, Take 1 capsule (300 mg total) by  mouth 2 (two) times daily., Disp: 90 capsule, Rfl: 3   glucose blood test strip, Test blood sugar daily. Dx code: 250.60., Disp: 100 each, Rfl: 3   insulin  glargine (LANTUS  SOLOSTAR) 100 UNIT/ML Solostar Pen, Inject 16 Units into the skin at bedtime., Disp: 15 mL, Rfl: 11   Insulin  Pen Needle (PEN NEEDLES) 32G X 6 MM MISC, 1 each by Does not apply route at bedtime., Disp: 100 each, Rfl: 1   L-Methylfolate-Algae-B12-B6 (METANX) 3-90.314-2-35 MG CAPS, Take 1 capsule by mouth daily., Disp: 60 capsule, Rfl: 3   Lancets MISC, Test blood sugar daily. Dx code 250.60, Disp: 100 each, Rfl: 3   latanoprost (XALATAN) 0.005 % ophthalmic solution, Place 1 drop into both eyes at bedtime., Disp: , Rfl:    lisinopril  (ZESTRIL ) 5 MG tablet, Take 0.5 tablets (2.5 mg total) by mouth daily., Disp: 90 tablet, Rfl: 1   omeprazole  (PRILOSEC) 40 MG capsule, Take 1 capsule (40 mg total) by mouth daily., Disp: 90 capsule, Rfl: 3   sildenafil (VIAGRA) 100 MG tablet, Take 100 mg by mouth as needed for erectile dysfunction., Disp: , Rfl:    traMADol  (ULTRAM ) 50 MG tablet, Take by mouth., Disp: , Rfl:  Current Facility-Administered Medications:    l-methylfolate-B6-B12 (METANX) 3-35-2 MG per tablet 1 tablet, 1 tablet, Oral, Daily,   Social History   Tobacco Use  Smoking Status Former   Types: Cigars   Quit date: 01/09/2017   Years since quitting: 7.7  Smokeless Tobacco Never    No Known Allergies Objective:  Vascular: DP/PT pulses 2/4 bilateral. CFT <3 seconds. Absent hair growth on digits. Edema noted to bilateral lower extremities. Xerosis noted bilaterally.  Skin. No lacerations or abrasions bilateral feet. Nails 1-5 bilateral  are thickened discolored and elongated with subungual debris. Hyperkeratotic lesion and previous ulceration sub third metatarsal on left .  Distal left third digit with ulceration noted and overlying hyperkeratosis slough and necrosis.  Distal middle phalanx visible and probe to bone  present.  Erythema and edema surrounding the digit.  No purulence noted today. Musculoskeletal: MMT 5/5 bilateral lower extremities in DF, PF, Inversion and Eversion. Deceased ROM in DF of ankle joint. Promence of medial arch noted and dorsal foot more pronounced than prior.  Neurological: Sensation intact to light touch. Protective sensation diminished bilateral.     Assessment:   1. 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 Charcot joint of left foot           Plan:  Patient was evaluated and treated and all questions answered. Discuss charcot arthropathy with patient and advised on NWB and keeping as much weight on foot off as possible until flare passes. Discussed this is a chronic disorder of the foot that requires constant monitoring for progression.  Ulcer distal left third digit wound with necrosis of bone -Debridement as below. -Dressed with Betadine, DSD. -Off-loading with cam boot -For osteomyelitis of the left third digit.  Discussed his best option is to head to the emergency room for MRI IV antibiotics and likely surgical amputation.  Patient is very hesitant to do this and is a very nervous individual.  He relates he would like to try everything he can to save his toe.  Instead we will plan on doing oral antibiotics and wound care. -Doxycycline  and ciprofloxacin sent to pharmacy. -MRI ordered for further evaluation. -Discussed glucose control and proper protein-rich diet.  -Discussed if any worsening redness, pain, fever or chills to call or may need to report to the emergency room. Patient expressed understanding. \  Return in 1 week for check   Procedure: Excisional Debridement of Wound Tool: Sharp #312 chisel blade/tissue nipper Type of Debridement: Sharp Excisional Frequency: Every two weeks until appropriately healed.  Dressing is to be changed daily/keeping the wound  clean and dry Rationale: Removal of non-viable soft tissue from the wound to promote healing.  Anesthesia: none Pre-Debridement Wound Measurements: Overlying callus  Post-Debridement Wound Measurements: 0.5 cm x 0.3 cm x 0.5 cm  Area devitalized tissue removed(nonviable tissue only): 0.5 cm x 0.3 cm.  Blood loss: Minimal (<10cc) Depth of Debridement: with bone necrosis Description of tissue removed: Slough and Necrotic Technique: The wound and the surrounding skin were prepped and draped in usual aseptic fashion.  Aseptic technique was maintained throughout the procedure.  Using #312 blade/tissue nipper sharp debridement of necrotic/nonviable tissue was performed until healthy bleeding wound bed was achieved.  No underlying bone or tendon was exposed during debridement.  The wound was thoroughly irrigated with normal saline solution Wound Progress:  Current Wound Volume: Debridement was performed of the chronic nonhealing diabetic foot  wound on distal left third digit.  Debridement removed 0.5 cm x 0.3 cm of the necrotic tissue and subcutaneous tissue and  purulent drainage was not present. Presence/absence of tissue: Necrotic tissue/nonviable tissue present at the base of the wound.  Sharp debridement was performed to remove the necrotic tissue/nonviable tissue back to viable tissue.  No devitalized/nonviable tissue present postdebridement.  Wound appeared clean and clear of infection No material in the wound was present that was identified to be inhibiting healing. Dressing: Dry, sterile, compression dressing. Disposition: Patient tolerated procedure well. Patient to return in 2 weeks for follow-up or as listed above.  Return in about 1 week (around 10/22/2024) for wound check.   Return in about 1 week (around 10/22/2024) for wound check.    Return in about 1 week (around 10/22/2024) for wound check.    Return in about 1 week (around 10/22/2024) for wound check.

## 2024-10-22 ENCOUNTER — Ambulatory Visit: Admitting: Podiatry

## 2024-10-27 ENCOUNTER — Ambulatory Visit: Admitting: Podiatry

## 2024-10-27 ENCOUNTER — Telehealth: Payer: Self-pay | Admitting: Podiatry

## 2024-10-27 DIAGNOSIS — Z91199 Patient's noncompliance with other medical treatment and regimen due to unspecified reason: Secondary | ICD-10-CM

## 2024-10-27 NOTE — Progress Notes (Signed)
 No show

## 2024-10-27 NOTE — Telephone Encounter (Signed)
 Patient reports being unable to reach Medicaid despite multiple attempts to schedule transportation to make appointment. As a result, the appointment has been rescheduled again for 11/03/24. Patient is requesting an additional refill of antibiotics (ABT).  Please send: for delivery  [facility] [place], [place] - [address] [place] [address] Phone: [phone]  Fax: [phone]

## 2024-10-27 NOTE — Telephone Encounter (Signed)
 Patient is requesting refill on their .dulaglutide  (Trulicity ) 4.5 mg/0.5 mL subcutaneous pen . Patient stated he is out of medication.   Pharmacy Name  [facility]  [address] [place]  [address]  Call back [phone] Fax number [phone]  Please advise. Patient can be reached at  [phone]

## 2024-10-30 ENCOUNTER — Other Ambulatory Visit: Payer: Self-pay | Admitting: Podiatry

## 2024-10-30 ENCOUNTER — Telehealth: Payer: Self-pay | Admitting: Podiatry

## 2024-10-30 MED ORDER — DOXYCYCLINE HYCLATE 100 MG PO TABS: 100.0000 mg | ORAL_TABLET | Freq: Two times a day (BID) | ORAL | 0 refills | Status: DC

## 2024-10-30 MED ORDER — CIPROFLOXACIN HCL 500 MG PO TABS: 500.0000 mg | ORAL_TABLET | Freq: Two times a day (BID) | ORAL | 0 refills | Status: DC

## 2024-10-30 NOTE — Telephone Encounter (Signed)
 Patient called stating he has been trying to reach a nurse, he needs medications refill before the holiday (Ciprofloxacin  500 mg) and (Doxycycline  100 mg) Patient is requesting nurse contact him today. [phone]

## 2024-10-30 NOTE — Telephone Encounter (Signed)
 Patient called again about his medication. He said that the medication was sent to the wrong pharmacy, he would like his medication sent to [facility].

## 2024-10-31 ENCOUNTER — Other Ambulatory Visit: Payer: Self-pay | Admitting: Podiatry

## 2024-10-31 MED ORDER — DOXYCYCLINE HYCLATE 100 MG PO TABS: 100.0000 mg | ORAL_TABLET | Freq: Two times a day (BID) | ORAL | 0 refills | Status: AC

## 2024-10-31 MED ORDER — CIPROFLOXACIN HCL 500 MG PO TABS: 500.0000 mg | ORAL_TABLET | Freq: Two times a day (BID) | ORAL | 0 refills | Status: AC

## 2024-11-03 ENCOUNTER — Ambulatory Visit: Admitting: Podiatry

## 2024-11-03 ENCOUNTER — Encounter: Payer: Self-pay | Admitting: Podiatry

## 2024-11-03 DIAGNOSIS — L97524 Non-pressure chronic ulcer of other part of left foot with necrosis of bone: Secondary | ICD-10-CM

## 2024-11-03 DIAGNOSIS — E08621 Diabetes mellitus due to underlying condition with foot ulcer: Secondary | ICD-10-CM | POA: Diagnosis not present

## 2024-11-03 MED ORDER — CIPROFLOXACIN HCL 500 MG PO TABS: 500.0000 mg | ORAL_TABLET | Freq: Two times a day (BID) | ORAL | 0 refills | Status: AC

## 2024-11-03 MED ORDER — DOXYCYCLINE HYCLATE 100 MG PO TABS: 100.0000 mg | ORAL_TABLET | Freq: Two times a day (BID) | ORAL | 0 refills | Status: AC

## 2024-11-03 NOTE — Progress Notes (Signed)
 "  Subjective:  Patient ID: [name], male    DOB: 11-03-1961,  MRN: [id number]  Chief Complaint  Patient presents with   Diabetic Ulcer    It's okay.  Saw Dr. Lear Faster - 09/14/2023; A1c - 6.7    Patient presents for n follow-up of left toe ulcer.  He relates that is doing okay.  He has not picked up antibiotic refill.  Patient has issues with traveling and cannot pick up nor does want to go pick up medications from certain pharmacies.  Has had trouble getting things sent over from [place].  Patient is diabetic and last A1c was  Lab Results  Component Value Date   HGBA1C 8.9 (H) 02/03/2023   .   PCP:  Patient, [name]     Review of Systems: Negative except as noted in the HPI. Denies N/V/F/Ch.  Past Medical History:  Diagnosis Date   Anxiety    Arthritis    Diabetes mellitus     Current Outpatient Medications:    ALPRAZolam  (XANAX ) 0.5 MG tablet, TAKE 1/2 TO 1 (ONE-HALF TO ONE) TABLET BY MOUTH AT BEDTIME AS NEEDED FOR ANXIETY OR SLEEP, Disp: 30 tablet, Rfl: 0   aspirin  81 MG tablet, Take 1 tablet (81 mg total) by mouth daily., Disp: 60 tablet, Rfl: 6   blood glucose meter kit and supplies KIT, Per insurance preference. Check blood glucose once a day. Dx E11.9, Z79.4, Disp: 1 each, Rfl: 11   cephALEXin  (KEFLEX ) 500 MG capsule, Take 1 capsule (500 mg total) by mouth 3 (three) times daily., Disp: 30 capsule, Rfl: 2   ciprofloxacin  (CIPRO ) 500 MG tablet, Take 1 tablet (500 mg total) by mouth 2 (two) times daily for 14 days., Disp: 28 tablet, Rfl: 0   ciprofloxacin  (CIPRO ) 500 MG tablet, Take 1 tablet (500 mg total) by mouth 2 (two) times daily for 28 days., Disp: 56 tablet, Rfl: 0   colchicine  0.6 MG tablet, Take 0.6 mg by mouth daily., Disp: , Rfl:    doxycycline  (VIBRA -TABS) 100 MG tablet, Take 1 tablet (100 mg total) by mouth 2 (two) times daily for 14 days., Disp: 28 tablet, Rfl: 0   doxycycline  (VIBRA -TABS) 100 MG tablet, Take 1 tablet (100 mg total) by mouth 2 (two)  times daily for 28 days., Disp: 56 tablet, Rfl: 0   Dulaglutide  (TRULICITY ) 3 MG/0.5ML SOPN, Inject 3 mg into the skin once a week., Disp: , Rfl:    gabapentin  (NEURONTIN ) 300 MG capsule, Take 1 capsule (300 mg total) by mouth 2 (two) times daily., Disp: 90 capsule, Rfl: 3   glucose blood test strip, Test blood sugar daily. Dx code: 250.60., Disp: 100 each, Rfl: 3   insulin  glargine (LANTUS  SOLOSTAR) 100 UNIT/ML Solostar Pen, Inject 16 Units into the skin at bedtime., Disp: 15 mL, Rfl: 11   Insulin  Pen Needle (PEN NEEDLES) 32G X 6 MM MISC, 1 each by Does not apply route at bedtime., Disp: 100 each, Rfl: 1   L-Methylfolate-Algae-B12-B6 (METANX) 3-90.314-2-35 MG CAPS, Take 1 capsule by mouth daily., Disp: 60 capsule, Rfl: 3   Lancets MISC, Test blood sugar daily. Dx code 250.60, Disp: 100 each, Rfl: 3   latanoprost (XALATAN) 0.005 % ophthalmic solution, Place 1 drop into both eyes at bedtime., Disp: , Rfl:    lisinopril  (ZESTRIL ) 5 MG tablet, Take 0.5 tablets (2.5 mg total) by mouth daily., Disp: 90 tablet, Rfl: 1   omeprazole  (PRILOSEC) 40 MG capsule, Take 1 capsule (40 mg total)  by mouth daily., Disp: 90 capsule, Rfl: 3   sildenafil (VIAGRA) 100 MG tablet, Take 100 mg by mouth as needed for erectile dysfunction., Disp: , Rfl:    traMADol  (ULTRAM ) 50 MG tablet, Take by mouth., Disp: , Rfl:   Current Facility-Administered Medications:    l-methylfolate-B6-B12 (METANX) 3-35-2 MG per tablet 1 tablet, 1 tablet, Oral, Daily,   Social History   Tobacco Use  Smoking Status Former   Types: Cigars   Quit date: 01/09/2017   Years since quitting: 7.8  Smokeless Tobacco Never    No Known Allergies Objective:  Vascular: DP/PT pulses 2/4 bilateral. CFT <3 seconds. Absent hair growth on digits. Edema noted to bilateral lower extremities. Xerosis noted bilaterally.  Skin. No lacerations or abrasions bilateral feet. Nails 1-5 bilateral  are thickened discolored and elongated with subungual debris.  Hyperkeratotic lesion and previous ulceration sub third metatarsal on left .  Distal left third digit with ulceration noted and overlying hyperkeratosis slough and necrosis.  Distal middle phalanx visible and probe to bone present.  Erythema and edema surrounding the digit.  No purulence noted today. Musculoskeletal: MMT 5/5 bilateral lower extremities in DF, PF, Inversion and Eversion. Deceased ROM in DF of ankle joint. Promence of medial arch noted and dorsal foot more pronounced than prior.  Neurological: Sensation intact to light touch. Protective sensation diminished bilateral.     Assessment:   1. 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 Discuss charcot arthropathy with patient and advised on NWB and keeping as much weight on foot off as possible until flare passes. Discussed this is a chronic disorder of the foot that requires constant monitoring for progression.  Ulcer distal left third digit wound with necrosis of bone -Debridement as below. -Dressed with Betadine, DSD. -Off-loading with cam boot -For osteomyelitis of the left third digit.  Discussed his best option is to head to the emergency room for MRI IV antibiotics and likely surgical amputation.  Patient is very hesitant to do this and is a very nervous individual.  He relates he would like to try everything he can to save his toe.  Instead we will plan on doing oral antibiotics and wound care. -Doxycycline  and ciprofloxacin  sent to pharmacy.Additional 4 weeks sent to make sure getting at least 6 weeks of coverage.  -MRI ordered for further evaluation. Patient has not scheduled his MRI.  -Discussed glucose control and proper protein-rich diet.  -Discussed if any worsening redness, pain, fever or chills to call or may need to report to the emergency room.  Patient expressed understanding. \    Procedure: Excisional Debridement of Wound Tool: Sharp #312 chisel blade/tissue nipper Type of Debridement: Sharp Excisional Frequency: Every two weeks until appropriately healed.  Dressing is to be changed daily/keeping the wound clean and dry Rationale: Removal of non-viable soft tissue from the wound to promote healing.  Anesthesia: none Pre-Debridement Wound Measurements: Overlying callus  Post-Debridement Wound Measurements: 0.4 cm x 0.3 cm x 0.5 cm  Area devitalized tissue removed(nonviable tissue only): 0.4 cm x 0.3 cm.  Blood loss: Minimal (<10cc) Depth of Debridement: with bone necrosis Description of tissue removed: Slough and Necrotic Technique: The wound and the surrounding skin were prepped and draped in usual aseptic fashion.  Aseptic technique was maintained throughout the procedure.  Using #  312 blade/tissue nipper sharp debridement of necrotic/nonviable tissue was performed until healthy bleeding wound bed was achieved.  No underlying bone or tendon was exposed during debridement.  The wound was thoroughly irrigated with normal saline solution Wound Progress:  Current Wound Volume: Debridement was performed of the chronic nonhealing diabetic foot wound on distal left third digit.  Debridement removed 0.5 cm x 0.3 cm of the necrotic tissue and subcutaneous tissue and  purulent drainage was not present. Presence/absence of tissue: Necrotic tissue/nonviable tissue present at the base of the wound.  Sharp debridement was performed to remove the necrotic tissue/nonviable tissue back to viable tissue.  No devitalized/nonviable tissue present postdebridement.  Wound appeared clean and clear of infection No material in the wound was present that was identified to be inhibiting healing. Dressing: Dry, sterile, compression dressing. Disposition: Patient tolerated procedure well. Patient to return in 2 weeks for follow-up or as listed above.  No  follow-ups on file.   No follow-ups on file.    No follow-ups on file.    No follow-ups on file.  "

## 2024-11-19 ENCOUNTER — Ambulatory Visit: Admitting: Podiatry

## 2024-11-19 ENCOUNTER — Encounter: Payer: Self-pay | Admitting: Podiatry

## 2024-11-19 VITALS — BP 188/94 | HR 77 | Temp 97.3°F

## 2024-11-19 DIAGNOSIS — M14672 Charcot's joint, left ankle and foot: Secondary | ICD-10-CM

## 2024-11-19 DIAGNOSIS — L97524 Non-pressure chronic ulcer of other part of left foot with necrosis of bone: Secondary | ICD-10-CM

## 2024-11-19 DIAGNOSIS — E08621 Diabetes mellitus due to underlying condition with foot ulcer: Secondary | ICD-10-CM | POA: Diagnosis not present

## 2024-11-19 NOTE — Progress Notes (Signed)
 "  Subjective:  Patient ID: [name], male    DOB: [birth year],  MRN: [id number]  Chief Complaint  Patient presents with   Diabetic Ulcer    It's okay.  Saw Dr. Lear Faster - 09/14/2023; A1c - 6.7    Patient presents for  follow-up of left toe ulcer.  He relates that is doing okay.  He has been taking antibiotics and dressing as instructed.   Patient is diabetic and last A1c was  Lab Results  Component Value Date   HGBA1C 8.9 (H) 02/03/2023   .   PCP:  Patient, [name]     Review of Systems: Negative except as noted in the HPI. Denies N/V/F/Ch.  Past Medical History:  Diagnosis Date   Anxiety    Arthritis    Diabetes mellitus     Current Outpatient Medications:    ALPRAZolam  (XANAX ) 0.5 MG tablet, TAKE 1/2 TO 1 (ONE-HALF TO ONE) TABLET BY MOUTH AT BEDTIME AS NEEDED FOR ANXIETY OR SLEEP, Disp: 30 tablet, Rfl: 0   aspirin  81 MG tablet, Take 1 tablet (81 mg total) by mouth daily., Disp: 60 tablet, Rfl: 6   blood glucose meter kit and supplies KIT, Per insurance preference. Check blood glucose once a day. Dx E11.9, Z79.4, Disp: 1 each, Rfl: 11   cephALEXin  (KEFLEX ) 500 MG capsule, Take 1 capsule (500 mg total) by mouth 3 (three) times daily., Disp: 30 capsule, Rfl: 2   ciprofloxacin  (CIPRO ) 500 MG tablet, Take 1 tablet (500 mg total) by mouth 2 (two) times daily for 28 days., Disp: 56 tablet, Rfl: 0   colchicine  0.6 MG tablet, Take 0.6 mg by mouth daily., Disp: , Rfl:    doxycycline  (VIBRA -TABS) 100 MG tablet, Take 1 tablet (100 mg total) by mouth 2 (two) times daily for 28 days., Disp: 56 tablet, Rfl: 0   Dulaglutide  (TRULICITY ) 3 MG/0.5ML SOPN, Inject 3 mg into the skin once a week., Disp: , Rfl:    gabapentin  (NEURONTIN ) 300 MG capsule, Take 1 capsule (300 mg total) by mouth 2 (two) times daily., Disp: 90 capsule, Rfl: 3   glucose blood test strip, Test blood sugar daily. Dx code: 250.60., Disp: 100 each, Rfl: 3   insulin  glargine (LANTUS  SOLOSTAR) 100 UNIT/ML Solostar  Pen, Inject 16 Units into the skin at bedtime., Disp: 15 mL, Rfl: 11   Insulin  Pen Needle (PEN NEEDLES) 32G X 6 MM MISC, 1 each by Does not apply route at bedtime., Disp: 100 each, Rfl: 1   L-Methylfolate-Algae-B12-B6 (METANX) 3-90.314-2-35 MG CAPS, Take 1 capsule by mouth daily., Disp: 60 capsule, Rfl: 3   Lancets MISC, Test blood sugar daily. Dx code 250.60, Disp: 100 each, Rfl: 3   latanoprost (XALATAN) 0.005 % ophthalmic solution, Place 1 drop into both eyes at bedtime., Disp: , Rfl:    lisinopril  (ZESTRIL ) 5 MG tablet, Take 0.5 tablets (2.5 mg total) by mouth daily., Disp: 90 tablet, Rfl: 1   omeprazole  (PRILOSEC) 40 MG capsule, Take 1 capsule (40 mg total) by mouth daily., Disp: 90 capsule, Rfl: 3   sildenafil (VIAGRA) 100 MG tablet, Take 100 mg by mouth as needed for erectile dysfunction., Disp: , Rfl:    traMADol  (ULTRAM ) 50 MG tablet, Take by mouth., Disp: , Rfl:   Current Facility-Administered Medications:    l-methylfolate-B6-B12 (METANX) 3-35-2 MG per tablet 1 tablet, 1 tablet, Oral, Daily,   Social History   Tobacco Use  Smoking Status Former   Types: Cigars   Quit date:  01/09/2017   Years since quitting: 7.8  Smokeless Tobacco Never    No Known Allergies Objective:  Vascular: DP/PT pulses 2/4 bilateral. CFT <3 seconds. Absent hair growth on digits. Edema noted to bilateral lower extremities. Xerosis noted bilaterally.  Skin. No lacerations or abrasions bilateral feet. Nails 1-5 bilateral  are thickened discolored and elongated with subungual debris. Hyperkeratotic lesion and previous ulceration sub third metatarsal on left .  Distal left third digit with ulceration noted and overlying hyperkeratosis slough and necrosis.  Wound no longer probes to bone.  Improved erythema and edema noted in the digit. Musculoskeletal: MMT 5/5 bilateral lower extremities in DF, PF, Inversion and Eversion. Deceased ROM in DF of ankle joint. Promence of medial arch noted and dorsal foot more  pronounced than prior.  Neurological: Sensation intact to light touch. Protective sensation diminished bilateral.     Assessment:   1. 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 Charcot joint of left foot             Plan:  Patient was evaluated and treated and all questions answered. Discuss charcot arthropathy with patient and advised on NWB and keeping as much weight on foot off as possible until flare passes. Discussed this is a chronic disorder of the foot that requires constant monitoring for progression.  Ulcer distal left third digit wound with necrosis of bone -Debridement as below. -Dressed with Betadine, DSD. -Off-loading with cam boot - Concern for osteomyelitis of the left third digit.  Discussed wound is looking much better today.  Will plan on continuing wound care and antibiotics.  Should have 2 more weeks of doxycycline  left and will finish out this course.. -MRI had been ordered for further evaluation. Patient has not scheduled his MRI we will hold off for now given improvement. -Discussed glucose control and proper protein-rich diet.  -Discussed if any worsening redness, pain, fever or chills to call or may need to report to the emergency room. Patient expressed understanding.   Return in 2 weeks for recheck  Procedure: Excisional Debridement of Wound Tool: Sharp #312 chisel blade/tissue nipper Type of Debridement: Sharp Excisional Frequency: Every two weeks until appropriately healed.  Dressing is to be changed daily/keeping the wound clean and dry Rationale: Removal of non-viable soft tissue from the wound to promote healing.  Anesthesia: none Pre-Debridement Wound Measurements: Overlying callus  Post-Debridement Wound Measurements: 0.2 cm x 0.3 cm x 0.5 cm  Area devitalized tissue removed(nonviable tissue only): 0.2 cm x 0.3 cm.  Blood loss: Minimal (<10cc) Depth  of Debridement: with bone necrosis Description of tissue removed: Slough and Necrotic Technique: The wound and the surrounding skin were prepped and draped in usual aseptic fashion.  Aseptic technique was maintained throughout the procedure.  Using #312 blade/tissue nipper sharp debridement of necrotic/nonviable tissue was performed until healthy bleeding wound bed was achieved.  No underlying bone or tendon was exposed during debridement.  The wound was thoroughly irrigated with normal saline solution Wound Progress:  Current Wound Volume: Debridement was performed of the chronic nonhealing diabetic foot wound on distal left third digit.  Debridement removed 0.2 cm x 0.3 cm of the necrotic tissue and subcutaneous tissue and  purulent drainage was not present. Presence/absence of tissue: Necrotic tissue/nonviable tissue present at the base of the wound.  Sharp debridement was performed to remove the necrotic tissue/nonviable tissue back to viable tissue.  No devitalized/nonviable tissue  present postdebridement.  Wound appeared clean and clear of infection No material in the wound was present that was identified to be inhibiting healing. Dressing: Dry, sterile, compression dressing. Disposition: Patient tolerated procedure well. Patient to return in 2 weeks for follow-up or as listed above.  Return in about 2 weeks (around 12/03/2024) for wound check.   Return in about 2 weeks (around 12/03/2024) for wound check.    Return in about 2 weeks (around 12/03/2024) for wound check.    Return in about 2 weeks (around 12/03/2024) for wound check.  "

## 2024-12-03 ENCOUNTER — Ambulatory Visit: Admitting: Podiatry

## 2024-12-03 ENCOUNTER — Encounter: Payer: Self-pay | Admitting: Podiatry

## 2024-12-03 DIAGNOSIS — E08621 Diabetes mellitus due to underlying condition with foot ulcer: Secondary | ICD-10-CM

## 2024-12-03 DIAGNOSIS — L97524 Non-pressure chronic ulcer of other part of left foot with necrosis of bone: Secondary | ICD-10-CM

## 2024-12-03 MED ORDER — MUPIROCIN 2 % EX OINT: 1.0000 | TOPICAL_OINTMENT | Freq: Two times a day (BID) | CUTANEOUS | 2 refills | Status: AC

## 2024-12-03 NOTE — Progress Notes (Signed)
 "  Subjective:  Patient ID: [name], male    DOB: [birth year],  MRN: [id number]  Chief Complaint  Patient presents with   Diabetic Ulcer    It's okay.   Saw Dr. Lear Faster - 09/14/2023; A1c - 6.7    Patient presents for  follow-up of left toe ulcer.  He relates that is doing okay.  He relates he has been taking the antibiotics but has not been taking them as instructed he has been taking one 1 day skipping a day and taking them out of the the other day he should have been done with them by now but has another week left..   Patient is diabetic and last A1c was  Lab Results  Component Value Date   HGBA1C 8.9 (H) 02/03/2023   .   PCP:  Patient, [name]     Review of Systems: Negative except as noted in the HPI. Denies N/V/F/Ch.  Past Medical History:  Diagnosis Date   Anxiety    Arthritis    Diabetes mellitus     Current Outpatient Medications:    ALPRAZolam  (XANAX ) 0.5 MG tablet, TAKE 1/2 TO 1 (ONE-HALF TO ONE) TABLET BY MOUTH AT BEDTIME AS NEEDED FOR ANXIETY OR SLEEP, Disp: 30 tablet, Rfl: 0   aspirin  81 MG tablet, Take 1 tablet (81 mg total) by mouth daily., Disp: 60 tablet, Rfl: 6   blood glucose meter kit and supplies KIT, Per insurance preference. Check blood glucose once a day. Dx E11.9, Z79.4, Disp: 1 each, Rfl: 11   cephALEXin  (KEFLEX ) 500 MG capsule, Take 1 capsule (500 mg total) by mouth 3 (three) times daily., Disp: 30 capsule, Rfl: 2   colchicine  0.6 MG tablet, Take 0.6 mg by mouth daily., Disp: , Rfl:    Dulaglutide  (TRULICITY ) 3 MG/0.5ML SOPN, Inject 3 mg into the skin once a week., Disp: , Rfl:    gabapentin  (NEURONTIN ) 300 MG capsule, Take 1 capsule (300 mg total) by mouth 2 (two) times daily., Disp: 90 capsule, Rfl: 3   glucose blood test strip, Test blood sugar daily. Dx code: 250.60., Disp: 100 each, Rfl: 3   insulin  glargine (LANTUS  SOLOSTAR) 100 UNIT/ML Solostar Pen, Inject 16 Units into the skin at bedtime., Disp: 15 mL, Rfl: 11   Insulin  Pen Needle  (PEN NEEDLES) 32G X 6 MM MISC, 1 each by Does not apply route at bedtime., Disp: 100 each, Rfl: 1   L-Methylfolate-Algae-B12-B6 (METANX) 3-90.314-2-35 MG CAPS, Take 1 capsule by mouth daily., Disp: 60 capsule, Rfl: 3   Lancets MISC, Test blood sugar daily. Dx code 250.60, Disp: 100 each, Rfl: 3   latanoprost (XALATAN) 0.005 % ophthalmic solution, Place 1 drop into both eyes at bedtime., Disp: , Rfl:    lisinopril  (ZESTRIL ) 5 MG tablet, Take 0.5 tablets (2.5 mg total) by mouth daily., Disp: 90 tablet, Rfl: 1   mupirocin  ointment (BACTROBAN ) 2 %, Apply 1 Application topically 2 (two) times daily., Disp: 30 g, Rfl: 2   omeprazole  (PRILOSEC) 40 MG capsule, Take 1 capsule (40 mg total) by mouth daily., Disp: 90 capsule, Rfl: 3   sildenafil (VIAGRA) 100 MG tablet, Take 100 mg by mouth as needed for erectile dysfunction., Disp: , Rfl:    traMADol  (ULTRAM ) 50 MG tablet, Take by mouth., Disp: , Rfl:   Current Facility-Administered Medications:    l-methylfolate-B6-B12 (METANX) 3-35-2 MG per tablet 1 tablet, 1 tablet, Oral, Daily,   Social History   Tobacco Use  Smoking Status Former  Types: Cigars   Quit date: 01/09/2017   Years since quitting: 7.9  Smokeless Tobacco Never    No Known Allergies Objective:  Vascular: DP/PT pulses 2/4 bilateral. CFT <3 seconds. Absent hair growth on digits. Edema noted to bilateral lower extremities. Xerosis noted bilaterally.  Skin. No lacerations or abrasions bilateral feet. Nails 1-5 bilateral  are thickened discolored and elongated with subungual debris. Hyperkeratotic lesion and previous ulceration sub third metatarsal on left .  Distal left third digit with ulceration noted and overlying hyperkeratosis slough and necrosis.  Wound no longer probes to bone.  Improved erythema and edema noted in the digit. Musculoskeletal: MMT 5/5 bilateral lower extremities in DF, PF, Inversion and Eversion. Deceased ROM in DF of ankle joint. Promence of medial arch noted and  dorsal foot more pronounced than prior.  Neurological: Sensation intact to light touch. Protective sensation diminished bilateral.     Assessment:   1. 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 Discuss charcot arthropathy with patient and advised on NWB and keeping as much weight on foot off as possible until flare passes. Discussed this is a chronic disorder of the foot that requires constant monitoring for progression.  Ulcer distal left third digit wound with necrosis of bone healed. Osteomyelits third toe left.  -Debridement of hyperkeratotic lesion today as courtesy with underlying ulceration healed. Advised patient that he needs to take antibiotics as instructed and runs the risk of antibiotic resistance in the future if he continues to not listen to instruction. -Dressed with Betadine, DSD. -Off-loading with cam boot - Concern for osteomyelitis of the left third digit.  Discussed wound is looking much better today.  Will plan on continuing wound care and antibiotics.  Will finish out week of antibiotics he has left daily he is to take them.  After this we will discontinue antibiotics. -MRI had been ordered for further evaluation. Patient has not scheduled his MRI we will hold off for now given improvement. -Discussed glucose control and proper protein-rich diet.  -Discussed if any worsening redness, pain, fever or chills to call or may need to report to the emergency room. Patient expressed understanding.   Return in 3 weeks for recheck   Return in about 3 weeks (around 12/24/2024) for wound check.   Return in about 3 weeks (around 12/24/2024) for wound check.    Return in about 3 weeks (around 12/24/2024) for wound check.    Return in about 3 weeks (around 12/24/2024) for wound check.  "

## 2024-12-24 ENCOUNTER — Ambulatory Visit: Admitting: Podiatry
